# Patient Record
Sex: Male | Born: 1950 | Race: White | Hispanic: No | Marital: Single | Smoking: Current some day smoker
Health system: Southern US, Community
[De-identification: ages and names within clinical notes are randomized; demographics above are authoritative.]

## PROBLEM LIST (undated history)

## (undated) DIAGNOSIS — R569 Unspecified convulsions: Secondary | ICD-10-CM

## (undated) DIAGNOSIS — F1010 Alcohol abuse, uncomplicated: Secondary | ICD-10-CM

## (undated) DIAGNOSIS — I10 Essential (primary) hypertension: Secondary | ICD-10-CM

## (undated) DIAGNOSIS — N182 Chronic kidney disease, stage 2 (mild): Secondary | ICD-10-CM

## (undated) DIAGNOSIS — I639 Cerebral infarction, unspecified: Secondary | ICD-10-CM

## (undated) DIAGNOSIS — C787 Secondary malignant neoplasm of liver and intrahepatic bile duct: Secondary | ICD-10-CM

## (undated) HISTORY — DX: Cerebral infarction, unspecified: I63.9

## (undated) HISTORY — DX: Alcohol abuse, uncomplicated: F10.10

## (undated) HISTORY — DX: Essential (primary) hypertension: I10

## (undated) HISTORY — DX: Unspecified convulsions: R56.9

## (undated) HISTORY — PX: CERVICAL DISC SURGERY: SHX588

---

## 2000-05-31 ENCOUNTER — Inpatient Hospital Stay (HOSPITAL_COMMUNITY): Admission: EM | Admit: 2000-05-31 | Discharge: 2000-06-02 | Payer: Self-pay

## 2000-05-31 ENCOUNTER — Encounter: Payer: Self-pay | Admitting: Surgery

## 2000-06-01 ENCOUNTER — Encounter: Payer: Self-pay | Admitting: Surgery

## 2008-03-04 ENCOUNTER — Emergency Department (HOSPITAL_COMMUNITY): Admission: EM | Admit: 2008-03-04 | Discharge: 2008-03-04 | Payer: Self-pay | Admitting: Emergency Medicine

## 2009-12-05 ENCOUNTER — Emergency Department (HOSPITAL_COMMUNITY): Admission: EM | Admit: 2009-12-05 | Discharge: 2009-12-05 | Payer: Self-pay | Admitting: Family Medicine
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
## 2010-12-27 LAB — CBC
HCT: 44.7 % (ref 39.0–52.0)
Hemoglobin: 15.3 g/dL (ref 13.0–17.0)
MCHC: 34.2 g/dL (ref 30.0–36.0)
MCV: 93.8 fL (ref 78.0–100.0)
Platelets: 239 10*3/uL (ref 150–400)
RBC: 4.77 MIL/uL (ref 4.22–5.81)
RDW: 13.3 % (ref 11.5–15.5)
WBC: 15.2 10*3/uL — ABNORMAL HIGH (ref 4.0–10.5)

## 2010-12-27 LAB — HEPATIC FUNCTION PANEL
ALT: 17 U/L (ref 0–53)
AST: 16 U/L (ref 0–37)
Alkaline Phosphatase: 55 U/L (ref 39–117)
Bilirubin, Direct: 0.2 mg/dL (ref 0.0–0.3)
Total Protein: 7 g/dL (ref 6.0–8.3)

## 2010-12-27 LAB — DIFFERENTIAL
Basophils Absolute: 0.2 10*3/uL — ABNORMAL HIGH (ref 0.0–0.1)
Basophils Relative: 1 % (ref 0–1)
Eosinophils Absolute: 0.1 10*3/uL (ref 0.0–0.7)
Eosinophils Relative: 1 % (ref 0–5)
Monocytes Absolute: 1 10*3/uL (ref 0.1–1.0)
Monocytes Relative: 7 % (ref 3–12)
Neutro Abs: 11.9 10*3/uL — ABNORMAL HIGH (ref 1.7–7.7)

## 2010-12-27 LAB — BASIC METABOLIC PANEL
BUN: 15 mg/dL (ref 6–23)
CO2: 24 mEq/L (ref 19–32)
Calcium: 9.1 mg/dL (ref 8.4–10.5)
Chloride: 106 mEq/L (ref 96–112)
Creatinine, Ser: 0.9 mg/dL (ref 0.4–1.5)
GFR calc Af Amer: >60 mL/min (ref >60)
GFR calc non Af Amer: >60 mL/min (ref >60)
Glucose, Bld: 111 mg/dL — ABNORMAL HIGH (ref 70–99)
Potassium: 3.6 mEq/L (ref 3.5–5.1)
Sodium: 139 mEq/L (ref 135–145)

## 2010-12-27 LAB — URINALYSIS, ROUTINE W REFLEX MICROSCOPIC
Ketones, ur: NEGATIVE mg/dL
Specific Gravity, Urine: 1.028 (ref 1.005–1.030)
Urobilinogen, UA: 2 mg/dL — ABNORMAL HIGH (ref 0.0–1.0)

## 2010-12-27 LAB — URINE MICROSCOPIC-ADD ON

## 2016-06-18 ENCOUNTER — Inpatient Hospital Stay (HOSPITAL_COMMUNITY): Payer: Medicare HMO

## 2016-06-18 ENCOUNTER — Emergency Department (HOSPITAL_COMMUNITY): Payer: Medicare HMO

## 2016-06-18 ENCOUNTER — Encounter (HOSPITAL_COMMUNITY): Payer: Self-pay

## 2016-06-18 ENCOUNTER — Inpatient Hospital Stay (HOSPITAL_COMMUNITY)
Admission: EM | Admit: 2016-06-18 | Discharge: 2016-06-28 | DRG: 065 | Disposition: A | Discharge status: Home Health | Payer: Medicare HMO | Attending: Internal Medicine | Admitting: Internal Medicine

## 2016-06-18 DIAGNOSIS — Z881 Allergy status to other antibiotic agents status: Secondary | ICD-10-CM

## 2016-06-18 DIAGNOSIS — G4733 Obstructive sleep apnea (adult) (pediatric): Secondary | ICD-10-CM | POA: Diagnosis not present

## 2016-06-18 DIAGNOSIS — E669 Obesity, unspecified: Secondary | ICD-10-CM | POA: Diagnosis not present

## 2016-06-18 DIAGNOSIS — Z79899 Other long term (current) drug therapy: Secondary | ICD-10-CM

## 2016-06-18 DIAGNOSIS — I10 Essential (primary) hypertension: Secondary | ICD-10-CM | POA: Diagnosis present

## 2016-06-18 DIAGNOSIS — Z23 Encounter for immunization: Secondary | ICD-10-CM | POA: Diagnosis present

## 2016-06-18 DIAGNOSIS — W06XXXA Fall from bed, initial encounter: Secondary | ICD-10-CM | POA: Diagnosis not present

## 2016-06-18 DIAGNOSIS — Z9114 Patient's other noncompliance with medication regimen: Secondary | ICD-10-CM

## 2016-06-18 DIAGNOSIS — Z720 Tobacco use: Secondary | ICD-10-CM | POA: Diagnosis present

## 2016-06-18 DIAGNOSIS — Z781 Physical restraint status: Secondary | ICD-10-CM | POA: Diagnosis not present

## 2016-06-18 DIAGNOSIS — I611 Nontraumatic intracerebral hemorrhage in hemisphere, cortical: Principal | ICD-10-CM

## 2016-06-18 DIAGNOSIS — F10931 Alcohol use, unspecified with withdrawal delirium: Secondary | ICD-10-CM | POA: Diagnosis present

## 2016-06-18 DIAGNOSIS — F17200 Nicotine dependence, unspecified, uncomplicated: Secondary | ICD-10-CM | POA: Diagnosis not present

## 2016-06-18 DIAGNOSIS — Z6828 Body mass index (BMI) 28.0-28.9, adult: Secondary | ICD-10-CM

## 2016-06-18 DIAGNOSIS — E870 Hyperosmolality and hypernatremia: Secondary | ICD-10-CM | POA: Diagnosis present

## 2016-06-18 DIAGNOSIS — I619 Nontraumatic intracerebral hemorrhage, unspecified: Secondary | ICD-10-CM | POA: Diagnosis not present

## 2016-06-18 DIAGNOSIS — S022XXA Fracture of nasal bones, initial encounter for closed fracture: Secondary | ICD-10-CM | POA: Diagnosis present

## 2016-06-18 DIAGNOSIS — R569 Unspecified convulsions: Secondary | ICD-10-CM | POA: Diagnosis not present

## 2016-06-18 DIAGNOSIS — S06360A Traumatic hemorrhage of cerebrum, unspecified, without loss of consciousness, initial encounter: Secondary | ICD-10-CM | POA: Diagnosis not present

## 2016-06-18 DIAGNOSIS — F10231 Alcohol dependence with withdrawal delirium: Secondary | ICD-10-CM | POA: Diagnosis present

## 2016-06-18 DIAGNOSIS — N183 Chronic kidney disease, stage 3 (moderate): Secondary | ICD-10-CM | POA: Diagnosis present

## 2016-06-18 DIAGNOSIS — E871 Hypo-osmolality and hyponatremia: Secondary | ICD-10-CM | POA: Diagnosis present

## 2016-06-18 DIAGNOSIS — E1122 Type 2 diabetes mellitus with diabetic chronic kidney disease: Secondary | ICD-10-CM | POA: Diagnosis present

## 2016-06-18 DIAGNOSIS — Z8679 Personal history of other diseases of the circulatory system: Secondary | ICD-10-CM | POA: Diagnosis present

## 2016-06-18 DIAGNOSIS — Z716 Tobacco abuse counseling: Secondary | ICD-10-CM

## 2016-06-18 DIAGNOSIS — I612 Nontraumatic intracerebral hemorrhage in hemisphere, unspecified: Secondary | ICD-10-CM | POA: Diagnosis not present

## 2016-06-18 DIAGNOSIS — G9389 Other specified disorders of brain: Secondary | ICD-10-CM | POA: Diagnosis not present

## 2016-06-18 DIAGNOSIS — R001 Bradycardia, unspecified: Secondary | ICD-10-CM | POA: Diagnosis not present

## 2016-06-18 DIAGNOSIS — I610 Nontraumatic intracerebral hemorrhage in hemisphere, subcortical: Secondary | ICD-10-CM | POA: Diagnosis not present

## 2016-06-18 DIAGNOSIS — R4182 Altered mental status, unspecified: Secondary | ICD-10-CM | POA: Diagnosis not present

## 2016-06-18 DIAGNOSIS — S069X0S Unspecified intracranial injury without loss of consciousness, sequela: Secondary | ICD-10-CM | POA: Diagnosis not present

## 2016-06-18 DIAGNOSIS — R29818 Other symptoms and signs involving the nervous system: Secondary | ICD-10-CM

## 2016-06-18 DIAGNOSIS — N179 Acute kidney failure, unspecified: Secondary | ICD-10-CM | POA: Diagnosis present

## 2016-06-18 DIAGNOSIS — E785 Hyperlipidemia, unspecified: Secondary | ICD-10-CM | POA: Diagnosis not present

## 2016-06-18 DIAGNOSIS — G40409 Other generalized epilepsy and epileptic syndromes, not intractable, without status epilepticus: Secondary | ICD-10-CM | POA: Diagnosis present

## 2016-06-18 DIAGNOSIS — R69 Illness, unspecified: Secondary | ICD-10-CM | POA: Diagnosis not present

## 2016-06-18 DIAGNOSIS — I129 Hypertensive chronic kidney disease with stage 1 through stage 4 chronic kidney disease, or unspecified chronic kidney disease: Secondary | ICD-10-CM | POA: Diagnosis present

## 2016-06-18 DIAGNOSIS — T50995A Adverse effect of other drugs, medicaments and biological substances, initial encounter: Secondary | ICD-10-CM | POA: Diagnosis not present

## 2016-06-18 DIAGNOSIS — G40909 Epilepsy, unspecified, not intractable, without status epilepticus: Secondary | ICD-10-CM

## 2016-06-18 DIAGNOSIS — T1490XA Injury, unspecified, initial encounter: Secondary | ICD-10-CM | POA: Diagnosis not present

## 2016-06-18 DIAGNOSIS — K439 Ventral hernia without obstruction or gangrene: Secondary | ICD-10-CM | POA: Diagnosis present

## 2016-06-18 DIAGNOSIS — F17210 Nicotine dependence, cigarettes, uncomplicated: Secondary | ICD-10-CM | POA: Diagnosis present

## 2016-06-18 DIAGNOSIS — I629 Nontraumatic intracranial hemorrhage, unspecified: Secondary | ICD-10-CM | POA: Diagnosis not present

## 2016-06-18 DIAGNOSIS — I6789 Other cerebrovascular disease: Secondary | ICD-10-CM | POA: Diagnosis not present

## 2016-06-18 LAB — URINALYSIS, ROUTINE W REFLEX MICROSCOPIC
BILIRUBIN URINE: NEGATIVE
GLUCOSE, UA: 50 mg/dL — AB
KETONES UR: NEGATIVE mg/dL
LEUKOCYTES UA: NEGATIVE
NITRITE: NEGATIVE
PROTEIN: 100 mg/dL — AB
Specific Gravity, Urine: 1.011 (ref 1.005–1.030)
Squamous Epithelial / LPF: NONE SEEN
pH: 6 (ref 5.0–8.0)

## 2016-06-18 LAB — COMPREHENSIVE METABOLIC PANEL
ALBUMIN: 4.2 g/dL (ref 3.5–5.0)
ALT: 18 U/L (ref 17–63)
AST: 27 U/L (ref 15–41)
Alkaline Phosphatase: 69 U/L (ref 38–126)
Anion gap: 15 (ref 5–15)
BUN: 15 mg/dL (ref 6–20)
CHLORIDE: 106 mmol/L (ref 101–111)
CO2: 19 mmol/L — AB (ref 22–32)
Calcium: 8.8 mg/dL — ABNORMAL LOW (ref 8.9–10.3)
Creatinine, Ser: 1.56 mg/dL — ABNORMAL HIGH (ref 0.61–1.24)
GFR calc non Af Amer: 45 mL/min — ABNORMAL LOW (ref >60)
GFR, EST AFRICAN AMERICAN: 52 mL/min — AB (ref >60)
Glucose, Bld: 188 mg/dL — ABNORMAL HIGH (ref 65–99)
Potassium: 3.7 mmol/L (ref 3.5–5.1)
SODIUM: 140 mmol/L (ref 135–145)
Total Bilirubin: 1.3 mg/dL — ABNORMAL HIGH (ref 0.3–1.2)
Total Protein: 7.4 g/dL (ref 6.5–8.1)

## 2016-06-18 LAB — MRSA PCR SCREENING: MRSA BY PCR: NEGATIVE

## 2016-06-18 LAB — RAPID URINE DRUG SCREEN, HOSP PERFORMED
AMPHETAMINES: NOT DETECTED
Barbiturates: NOT DETECTED
Benzodiazepines: POSITIVE — AB
Cocaine: NOT DETECTED
OPIATES: NOT DETECTED
Tetrahydrocannabinol: NOT DETECTED

## 2016-06-18 LAB — ETHANOL: Alcohol, Ethyl (B): <5 mg/dL (ref <5)

## 2016-06-18 LAB — CBC
HCT: 46.3 % (ref 39.0–52.0)
Hemoglobin: 15.7 g/dL (ref 13.0–17.0)
MCH: 31.9 pg (ref 26.0–34.0)
MCHC: 33.9 g/dL (ref 30.0–36.0)
MCV: 94.1 fL (ref 78.0–100.0)
PLATELETS: 211 10*3/uL (ref 150–400)
RBC: 4.92 MIL/uL (ref 4.22–5.81)
RDW: 13 % (ref 11.5–15.5)
WBC: 11.5 10*3/uL — ABNORMAL HIGH (ref 4.0–10.5)

## 2016-06-18 LAB — TYPE AND SCREEN
ABO/RH(D): A POS
ANTIBODY SCREEN: NEGATIVE

## 2016-06-18 LAB — CBG MONITORING, ED: Glucose-Capillary: 173 mg/dL — ABNORMAL HIGH (ref 65–99)

## 2016-06-18 LAB — PROTIME-INR
INR: 1.07
Prothrombin Time: 13.9 seconds (ref 11.4–15.2)

## 2016-06-18 LAB — ABO/RH: ABO/RH(D): A POS

## 2016-06-18 MED ORDER — CLEVIDIPINE BUTYRATE 0.5 MG/ML IV EMUL
INTRAVENOUS | Status: AC
  Filled 2016-06-18: qty 50

## 2016-06-18 MED ORDER — ACETAMINOPHEN 650 MG RE SUPP: 650.0000 mg | RECTAL | Status: DC | PRN

## 2016-06-18 MED ORDER — ACETAMINOPHEN 325 MG PO TABS
650.0000 mg | ORAL_TABLET | ORAL | Status: DC | PRN
  Administered 2016-06-18 – 2016-06-20 (×3): 650 mg via ORAL
  Filled 2016-06-18 (×3): qty 2

## 2016-06-18 MED ORDER — LORAZEPAM 2 MG/ML IJ SOLN
1.0000 mg | INTRAMUSCULAR | Status: AC | PRN
  Administered 2016-06-18 – 2016-06-19 (×2): 1 mg via INTRAVENOUS
  Filled 2016-06-18: qty 1

## 2016-06-18 MED ORDER — LORAZEPAM 2 MG/ML IJ SOLN
INTRAMUSCULAR | Status: AC
  Administered 2016-06-18: 2 mg
  Filled 2016-06-18: qty 1

## 2016-06-18 MED ORDER — LORAZEPAM 2 MG/ML IJ SOLN
INTRAMUSCULAR | Status: AC
Start: 2016-06-18 — End: 2016-06-18
  Administered 2016-06-18: 1 mg
  Filled 2016-06-18: qty 1

## 2016-06-18 MED ORDER — STROKE: EARLY STAGES OF RECOVERY BOOK
Freq: Once | Status: AC
  Administered 2016-06-18: 1
  Filled 2016-06-18: qty 1

## 2016-06-18 MED ORDER — SODIUM CHLORIDE 0.9 % IV SOLN
1000.0000 mg | Freq: Once | INTRAVENOUS | Status: AC
  Administered 2016-06-18: 1000 mg via INTRAVENOUS
  Filled 2016-06-18: qty 10

## 2016-06-18 MED ORDER — SENNOSIDES-DOCUSATE SODIUM 8.6-50 MG PO TABS
1.0000 | ORAL_TABLET | Freq: Two times a day (BID) | ORAL | Status: DC
Start: 2016-06-18 — End: 2016-06-23
  Administered 2016-06-19 – 2016-06-21 (×4): 1 via ORAL
  Filled 2016-06-18 (×7): qty 1

## 2016-06-18 MED ORDER — SODIUM CHLORIDE 0.9 % IV SOLN
500.0000 mg | Freq: Two times a day (BID) | INTRAVENOUS | Status: DC
  Administered 2016-06-18 – 2016-06-25 (×14): 500 mg via INTRAVENOUS
  Filled 2016-06-18 (×14): qty 5

## 2016-06-18 MED ORDER — ACETAMINOPHEN 160 MG/5ML PO SOLN: 650.0000 mg | ORAL | Status: DC | PRN

## 2016-06-18 MED ORDER — TETANUS-DIPHTH-ACELL PERTUSSIS 5-2.5-18.5 LF-MCG/0.5 IM SUSP
0.5000 mL | Freq: Once | INTRAMUSCULAR | Status: AC
  Administered 2016-06-18: 0.5 mL via INTRAMUSCULAR
  Filled 2016-06-18: qty 0.5

## 2016-06-18 MED ORDER — IOPAMIDOL (ISOVUE-370) INJECTION 76%
INTRAVENOUS | Status: AC
  Administered 2016-06-18: 18:00:00
  Filled 2016-06-18: qty 50

## 2016-06-18 MED ORDER — CLEVIDIPINE BUTYRATE 0.5 MG/ML IV EMUL
0.0000 mg/h | INTRAVENOUS | Status: DC
  Administered 2016-06-18: 10 mg/h via INTRAVENOUS
  Administered 2016-06-18: 1 mg/h via INTRAVENOUS
  Filled 2016-06-18 (×3): qty 50

## 2016-06-18 MED ORDER — PANTOPRAZOLE SODIUM 40 MG IV SOLR
40.0000 mg | Freq: Every day | INTRAVENOUS | Status: DC
  Administered 2016-06-18 – 2016-06-19 (×2): 40 mg via INTRAVENOUS
  Filled 2016-06-18 (×2): qty 40

## 2016-06-18 NOTE — H&P (Addendum)
Neurology H&P Reason for Consult: ICH  CC: seizure  History is obtained from:patient  HPI: Gabriel Romero is a 66 y.o. male with a history of severe hypertension who presents with new onset seizures in the setting of a typical hematoma. He did fall earlier in the day, but does not clear if he hit his head, there is no report of loss of consciousness. He then had sudden onset seizure activity was brought in via EMS. Head CT reveals a temporal lobe hemorrhage.   LKW: Earlier today, I'm not certain what time tpa given?: no, ICH ICH Score: 1   ROS: Unable to obtain due to altered mental status.   Past medical history: Hypertension  FHx: Father - MI @ age 5   Social History:  reports that he has never smoked. He does not have any smokeless tobacco history on file. He reports that he drinks alcohol. His drug history is not on file.   He drinks at least 8 beers per day.    Exam: Current vital signs: BP 133/75   Pulse 78   Resp 19   Ht 6\' 1"  (1.854 m)   Wt 124.7 kg (275 lb)   SpO2 97%   BMI 36.28 kg/m  Vital signs in last 24 hours: Pulse Rate:  [78-124] 78 (03/28 1640) Resp:  [17-30] 19 (03/28 1640) BP: (133-230)/(75-176) 133/75 (03/28 1640) SpO2:  [91 %-100 %] 97 % (03/28 1640) Weight:  [124.7 kg (275 lb)] 124.7 kg (275 lb) (03/28 1527)   Physical Exam  Constitutional: Appears well-developed and well-nourished.  Psych: Affect appropriate to situation Eyes: No scleral injection HENT: No OP obstrucion, no clear evidence of head injury.  Head: Normocephalic.  Cardiovascular: Normal rate and regular rhythm.  Respiratory: Effort normal and breath sounds normal to anterior ascultation GI: Soft.  No distension. There is no tenderness.  Skin: Some abraisions  Neuro: Mental Status: Patient arouses to minor stimuli, he follows some simple commands. He states "I am cold" Cranial Nerves: II: Visual Fields are full. Pupils are equal, round, and reactive to light.    III,IV, VI: He is able to cross midline both directions, eyes are slightly disconjugate when looking towards the right but conjugate on leftward gaze V: Facial sensation is symmetric to temperature VII: Facial movement appears grossly VIII: hearing is intact to voice X: Uvula elevates symmetrically XII: tongue with slight left deviation Motor: He moves his right side more than the left, but does move both to command. He wiggles toes and shows thumbs Sensory: He does respond to noxious stimulation bilaterally Cerebellar: Does not perform.    I have reviewed labs in epic and the results pertinent to this consultation are: Cr 1.5  I have reviewed the images obtained: CT head - right temporal   Impression: 66 year old male with intraparenchymal hematoma. It is unclear whether the hematoma contributed to the fall or the fall to the hematoma. Certainly, a fall with head injury significant enough to give him a intraparenchymal hematoma I would expect to have loss of consciousness, not to have him get up and continue about his work for the rest of the day. Hemorrhagic infarct would be another possibility.  Given the severity of his hypertension (>703 systolic) .  I would favor using a slightly more liberal goal of 160 for blood pressure control.  Recommendations: 1) Admit to ICU 2) no antiplatelets or anticoagulants 3) blood pressure control with goal systolic 500 - 938 4) Frequent neuro checks 5) If symptoms worsen  or there is decreased mental status, repeat stat head CT 6) PT,OT,ST 7) MRI brain 8) I do not think he is in alcohol withdrawal the current time and he received significant doses of benzodiazepine, I will hold off on CIWA for tonight but he will likely need this starting soon 9) cleviprex for BP control.   This patient is critically ill and at significant risk of neurological worsening, death and care requires constant monitoring of vital signs, hemodynamics,respiratory and  cardiac monitoring, neurological assessment, discussion with family, other specialists and medical decision making of high complexity. I spent 50 minutes of neurocritical care time  in the care of  this patient.  Roland Rack, MD [facility] [phone]  If 7pm- 7am, please page neurology on call as listed in [place]. 06/18/2016  6:51 PM

## 2016-06-18 NOTE — Progress Notes (Signed)
[facility] Progress Note Patient Name: KEYLOR RANDS DOB: 1951/02/27 MRN: [id number]   Date of Service  06/18/2016  HPI/Events of Note  presents with new onset seizures in the setting of a typical hematoma. He did fall earlier in the day, but does not clear if he hit his head, there is no report of loss of consciousness. He then had sudden onset seizure activity was brought in via EMS. Head CT reveals a temporal lobe hemorrhage.  eICU Interventions  1) Admit to ICU, Neuro hosp Attending Phy 2) no antiplatelets or anticoagulants 3) blood pressure control with goal systolic 224 - 825 4) Frequent neuro checks 5) If symptoms worsen or there is decreased mental status, repeat stat head CT      Intervention Category Evaluation Type: New Patient Evaluation  Saisha Hogue 06/18/2016, 7:00 PM

## 2016-06-18 NOTE — ED Provider Notes (Signed)
[facility] DEPT Provider Note   CSN: [id number] Arrival date & time: 06/18/16  1520     History   Chief Complaint Chief Complaint  Patient presents with  . Seizures    HPI Gabriel Romero is a 66 y.o. male.  HPI  66 year old male with history of hypertension with noncompliance with his medications, aneurysms that his family states "are either in his chest or in his head," who presents with altered mental status s/p episode of seizure activity. EMS was called out to the pt's workplace for seizure activity. When they arrived, the patient was reportedly having generalized tonic-clonic seizure activity. They gave 5 mg of IV Versed with resolution of seizure activity, at which point the patient became very agitated, fighting and struggling with paramedics. He was given an additional 5 mg of Versed, at which point he continued to be agitated but was able to be safely transported to the emergency department. Coworkers state that the patient did have a fall earlier in the day, but they were unsure whether he hit head or lost consciousness.  When the patient's family arrives at bedside, they deny history of blood thinners or antiplatelet agents. They state that the patient has never had seizures before that they are aware of.  Patient is agitated, but redirectable on arrival after receiving 4 mg of IV Ativan. He is able to tell us that he has a headache, but is unable to provide additional history.  History reviewed. No pertinent past medical history.  Patient Active Problem List   Diagnosis Date Noted  . ICH (intracerebral hemorrhage) ([place]) 06/18/2016    History reviewed. No pertinent surgical history.     Home Medications    Prior to Admission medications   Not on File    Family History History reviewed. No pertinent family history.  Social History Social History  Substance Use Topics  . Smoking status: Never Smoker  . Smokeless tobacco: Not on file  . Alcohol use Yes       Allergies   Sulfa antibiotics   Review of Systems Review of Systems  Unable to perform ROS: Mental status change     Physical Exam Updated Vital Signs BP (!) 142/56   Pulse 73   Temp 97.2 F (36.2 C) (Oral)   Resp 15   Ht 5\' 10"  (1.778 m)   Wt 90 kg   SpO2 94%   BMI 28.47 kg/m   Physical Exam  Constitutional: He appears well-developed and well-nourished. He appears distressed (agitated).  HENT:  Head: Normocephalic and atraumatic.  Right Ear: External ear normal.  Left Ear: External ear normal.  Mouth/Throat: Oropharynx is clear and moist.  Eyes: Conjunctivae and EOM are normal. Pupils are equal, round, and reactive to light.  Neck: Normal range of motion. Neck supple. No tracheal deviation present.  Cardiovascular: Normal heart sounds and intact distal pulses.   No murmur heard. Tachycardic to 100 bpm  Pulmonary/Chest: Effort normal and breath sounds normal. No respiratory distress. He has no wheezes. He has no rales.  Abdominal: Soft. Bowel sounds are normal. He exhibits no distension. There is no tenderness. A hernia (ventral hernia, self-reduces) is present.  Musculoskeletal:  Skin tears to the LUE, otherwise extremities are atrauamtic  Neurological: He exhibits normal muscle tone.  Greater strength to the R-hemibody compared to the left. No facial droop. No slurred speech. Acutely agitated.   Skin: He is not diaphoretic.  Skin tear to the L forearm     ED Treatments /  Results  Labs (all labs ordered are listed, but only abnormal results are displayed) Labs Reviewed  COMPREHENSIVE METABOLIC PANEL - Abnormal; Notable for the following:       Result Value   CO2 19 (*)    Glucose, Bld 188 (*)    Creatinine, Ser 1.56 (*)    Calcium 8.8 (*)    Total Bilirubin 1.3 (*)    GFR calc non Af Amer 45 (*)    GFR calc Af Amer 52 (*)    All other components within normal limits  CBC - Abnormal; Notable for the following:    WBC 11.5 (*)    All other  components within normal limits  RAPID URINE DRUG SCREEN, HOSP PERFORMED - Abnormal; Notable for the following:    Benzodiazepines POSITIVE (*)    All other components within normal limits  URINALYSIS, ROUTINE W REFLEX MICROSCOPIC - Abnormal; Notable for the following:    Glucose, UA 50 (*)    Hgb urine dipstick SMALL (*)    Protein, ur 100 (*)    Bacteria, UA RARE (*)    All other components within normal limits  CBG MONITORING, ED - Abnormal; Notable for the following:    Glucose-Capillary 173 (*)    All other components within normal limits  MRSA PCR SCREENING  ETHANOL  PROTIME-INR  TYPE AND SCREEN  ABO/RH    EKG  EKG Interpretation None       Radiology Ct Angio Head W Or Wo Contrast  Result Date: 06/18/2016 CLINICAL DATA:  Initial evaluation for acute intracranial hemorrhage. EXAM: CT ANGIOGRAPHY HEAD TECHNIQUE: Multidetector CT imaging of the head was performed using the standard protocol during bolus administration of intravenous contrast. Multiplanar CT image reconstructions and MIPs were obtained to evaluate the vascular anatomy. CONTRAST:  50 cc of Isovue 370. COMPARISON:  Prior CT from earlier the same day. FINDINGS: CTA HEAD Anterior circulation: Distal cervical segments of the internal carotid arteries are well opacified and widely patent. Petrous segments widely patent bilaterally. Scattered calcified atheromatous plaque throughout the cavernous/ supraclinoid ICAs. Resultant moderate to advanced multifocal narrowing (approximately 50-75%), slightly worse to the left. Note made of a focal moderate stenosis of approximately 50% within the proximal cavernous left ICA (series 502, image 46). The ICA termini widely patent bilaterally. A1 segments widely patent. Anterior communicating artery normal. Anterior cerebral arteries patent to their distal aspects. M1 segments patent without stenosis or occlusion. No proximal M2 occlusion. Distal MCA branches well opacified and  symmetric. No vascular abnormality seen underlying the peripheral right temporal lobe hemorrhage. Posterior circulation: Visualized vertebral arteries widely patent to the vertebrobasilar junction. Left PICA patent proximally. Right PICA not well visualized. Basilar artery widely patent to its distal aspect. Superior cerebral arteries patent bilaterally. Both of the posterior cerebral arteries primarily supplied via the basilar and are widely patent to their distal aspects. Venous sinuses: Not well evaluated on this exam due to timing of the contrast bolus. No obvious venous sinus thrombosis. Anatomic variants: No significant anatomic variant. No aneurysm or vascular malformation. No abnormality seen underlying the right temporal hemorrhage. Delayed phase: No pathologic enhancement. Previously identified hemorrhage within the right temporal lobe is relatively stable measuring 2.6 x 1.7 x 1.8 cm. IMPRESSION: 1. Negative CTA of the brain. No acute vascular abnormality identified. No AVM or other vascular abnormality seen underlying the right temporal lobe hemorrhage. 2. Atheromatous plaque involving the carotid siphons with moderate to advanced multifocal narrowing (approximately 50-75%), left greater than right. 3. No  significant interval change in size and appearance of right temporal lobe hemorrhage, measuring 2.6 x 1.7 x 1.8 cm. No significant mass effect. Electronically Signed   By: Jeannine Boga M.D.   On: 06/18/2016 19:14   Ct Head Wo Contrast  Result Date: 06/19/2016 CLINICAL DATA:  Follow-up of intracranial hemorrhage. EXAM: CT HEAD WITHOUT CONTRAST TECHNIQUE: Contiguous axial images were obtained from the base of the skull through the vertex without intravenous contrast. COMPARISON:  CT angiogram and CT of the head dated 06/18/2016. FINDINGS: Brain: Stable hematoma within the right temporal lobe. No new acute large territory infarct, intracranial hemorrhage, or focal mass effect identified.  Advanced chronic microvascular ischemic changes of white matter, chronic right parietal infarct, multiple small chronic lacunar infarcts within the basal ganglia, and mild brain parenchymal volume loss are stable. Stable ventricle size. Vascular: Extensive calcific atherosclerosis of cavernous and paraclinoid internal carotid arteries. Skull: Mildly displaced acute fracture deformity of the nasal bones with mild displacement. No displaced calvarial fracture. Sinuses/Orbits: Mild diffuse ethmoid sinus mucosal thickening and mucous retention cysts within the left maxillary sinus. Chronic left lamina papyracea fracture with herniation of extra orbital fat and mild medial displacement of the medial rectus muscle. Other: Periapical lucencies of multiple teeth in the maxillary alveolar bone compatible with odontogenic disease. IMPRESSION: 1. Stable acute hemorrhage within the right lateral temporal lobe. This area is atypical for hypertensive hemorrhage and traumatic hemorrhagic contusion or underlying hemorrhagic mass should be considered. 2. Stable advanced chronic microvascular ischemic changes of the brain, chronic infarcts, and volume loss. 3. Acute appearing mildly displaced nasal bone fractures and chronic left lamina papyracea fracture are stable. Electronically Signed   By: Kristine Garbe M.D.   On: 06/19/2016 00:35   Ct Head Wo Contrast  Result Date: 06/18/2016 CLINICAL DATA:  Altered mental status.  Seizure activity. EXAM: CT HEAD WITHOUT CONTRAST TECHNIQUE: Contiguous axial images were obtained from the base of the skull through the vertex without intravenous contrast. COMPARISON:  None. FINDINGS: Brain: Acute hemorrhage in the right lateral temporal lobe measures 27 x 19 x 13 mm. Hemorrhage volume 3.3 mL. No significant surrounding edema or mass. Mild atrophy without hydrocephalus. Hypodensity throughout the cerebral white matter bilaterally most likely chronic microvascular ischemia.  Hypodensity in the right parietal lobe involving white matter and cortex. This is most compatible with infarction, probably old but difficult to state with certainty. No other areas of hemorrhage. No shift of the midline structures. No subarachnoid hemorrhage or subdural hemorrhage. Vascular: Arterial calcification in the cavernous carotid bilaterally. Negative for hyperdense vessel. Skull: Negative for skull fracture. No significant scalp soft tissue swelling. Chronic fracture left medial orbit. Sinuses/Orbits: Mucosal edema in the paranasal sinuses. Other: None IMPRESSION: Small area of acute hemorrhage in the right lateral temporal lobe. Hemorrhage volume 3.3 mL. No significant surrounding edema or mass lesion. Differential considerations would include a traumatic hemorrhagic contusion. Venous infarction is possible. Hemorrhagic mass or infarct are consider less likely. Further evaluation with MRI and CTA suggested. Generalized atrophy with diffuse white matter disease most likely chronic. Hypodensity in the right parietal lobe most likely a chronic infarct. These results were called by telephone at the time of interpretation on 06/18/2016 at 3:59 pm to Dr. Zenovia Jarred , who verbally acknowledged these results. Electronically Signed   By: Franchot Gallo M.D.   On: 06/18/2016 16:00   Ct Cervical Spine Wo Contrast  Result Date: 06/18/2016 CLINICAL DATA:  Combative with seizure-like activity at work today. EXAM: CT CERVICAL SPINE  WITHOUT CONTRAST TECHNIQUE: Multidetector CT imaging of the cervical spine was performed without intravenous contrast. Multiplanar CT image reconstructions were also generated. COMPARISON:  None. FINDINGS: Despite efforts by the technologist and patient, mild motion artifact is present on today's exam and could not be eliminated. This reduces exam sensitivity and specificity. Alignment: Straightening without focal angulation or listhesis. Vertebrae: No evidence of acute fracture or  traumatic subluxation. No focal osseous lesions are seen. Paraspinal tissues: No paraspinal inflammatory changes or fluid collections are demonstrated. There is carotid atherosclerosis bilaterally. Disc levels: There is multilevel spondylosis with disc space narrowing, uncinate spurring and facet hypertrophy. These findings contribute to mild to moderate spinal stenosis at C5-6 and C6-7. In addition, there is moderate osseous foraminal narrowing bilaterally from C3-4 through C5-6. No acute soft disc herniation noted. IMPRESSION: No evidence of acute cervical spine fracture, traumatic subluxation or static signs of instability. Multilevel spondylosis, contributing to spinal and foraminal narrowing as described. Electronically Signed   By: Richardean Sale M.D.   On: 06/18/2016 16:02    Procedures Procedures (including critical care time)  Medications Ordered in ED Medications  clevidipine (CLEVIPREX) 0.5 MG/ML infusion (not administered)  clevidipine (CLEVIPREX) infusion 0.5 mg/mL (4 mg/hr Intravenous Rate/Dose Change 06/18/16 2217)  acetaminophen (TYLENOL) tablet 650 mg (650 mg Oral Given 06/18/16 2038)    Or  acetaminophen (TYLENOL) solution 650 mg ( Per Tube See Alternative 06/18/16 2038)    Or  acetaminophen (TYLENOL) suppository 650 mg ( Rectal See Alternative 06/18/16 2038)  senna-docusate (Senokot-S) tablet 1 tablet (1 tablet Oral Not Given 06/18/16 2222)  pantoprazole (PROTONIX) injection 40 mg (40 mg Intravenous Given 06/18/16 2222)  levETIRAcetam (KEPPRA) 500 mg in sodium chloride 0.9 % 100 mL IVPB (500 mg Intravenous Given 06/18/16 2222)  LORazepam (ATIVAN) injection 1 mg (1 mg Intravenous Given 06/18/16 2335)  LORazepam (ATIVAN) 2 MG/ML injection (2 mg  Given 06/18/16 1525)  LORazepam (ATIVAN) 2 MG/ML injection (2 mg  Given 06/18/16 1530)  Tdap (BOOSTRIX) injection 0.5 mL (0.5 mLs Intramuscular Given 06/18/16 1832)  iopamidol (ISOVUE-370) 76 % injection (  Contrast Given 06/18/16 1757)    levETIRAcetam (KEPPRA) 1,000 mg in sodium chloride 0.9 % 100 mL IVPB (0 mg Intravenous Stopped 06/18/16 1823)   stroke: mapping our early stages of recovery book (1 each Does not apply Given 06/18/16 1900)  LORazepam (ATIVAN) 2 MG/ML injection (1 mg  Given 06/18/16 2330)     Initial Impression / Assessment and Plan / ED Course  I have reviewed the triage vital signs and the nursing notes.  Pertinent labs & imaging results that were available during my care of the patient were reviewed by me and considered in my medical decision making (see chart for details).    Patient is agitated on arrival. Afebrile and hemodynamically stable. He was given 4 mg of IV Ativan without improvement in his agitation to allow for further examination. He was then given 5 mg of IV Haldol, at which point he was calm. He answers simple yes or no questions and follows simple commands, but continues to be somewhat confused.  CT head obtained, which shows a right temporal lobe hemorrhage. Patient started on clevidipine drip with goal SBP of less than 160 at the request of neurosurgery. CT cervical spine without acute fractures or malalignment.  Neurology consultation and evaluated the patient in the emergency department. They will be admitting the patient for further management.   Patient overseen by my attending, Dr. Thomasene Lot.    Final Clinical  Impressions(s) / ED Diagnoses   Final diagnoses:  ICH (intracerebral hemorrhage) ([place])  Neurologic abnormality  Nontraumatic cortical hemorrhage of right cerebral hemisphere [facility])    New Prescriptions There are no discharge medications for this patient.      Jenifer Algernon Huxley, MD 06/19/16 0155    Courteney Julio Alm, MD 06/22/16 [phone]

## 2016-06-18 NOTE — ED Notes (Signed)
Family at bedside. 

## 2016-06-18 NOTE — ED Notes (Addendum)
Haldol 5 mg iv given per vo dr Thomasene Lot at [phone]

## 2016-06-18 NOTE — Consult Note (Addendum)
Reason for Consult:ICH temporal lobe Referring Physician: ED  Gabriel Romero is an 66 y.o. male.  HPI: whom while at work slipped on cracked eggs approximately 0930 today. He did not lose consciousness, and continued to work until this afternoon when he started to seize and fell to the floor. Brought to the [facility] ED he was noted to be combative and he was given multiple sedatives and haldol in order to perform the CT. CT showed a ich in the right temporal lobe, little mass effect. 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 No pertinent past medical history.  History reviewed. No pertinent surgical history.  History reviewed. No pertinent family history.  Social History:  reports that he has never smoked. He does not have any smokeless tobacco history on file. He reports that he drinks alcohol. His drug history is not on file.  Allergies:  Allergies  Allergen Reactions  . Sulfa Antibiotics Rash    Medications: I have reviewed the patient's current medications.  Results for orders placed or performed during the hospital encounter of 06/18/16 (from the past 48 hour(s))  CBG monitoring, ED     Status: Abnormal   Collection Time: 06/18/16  3:22 PM  Result Value Ref Range   Glucose-Capillary 173 (H) 65 - 99 mg/dL  Comprehensive metabolic panel     Status: Abnormal   Collection Time: 06/18/16  3:30 PM  Result Value Ref Range   Sodium 140 135 - 145 mmol/L   Potassium 3.7 3.5 - 5.1 mmol/L   Chloride 106 101 - 111 mmol/L   CO2 19 (L) 22 - 32 mmol/L   Glucose, Bld 188 (H) 65 - 99 mg/dL   BUN 15 6 - 20 mg/dL   Creatinine, Ser 1.56 (H) 0.61 - 1.24 mg/dL   Calcium 8.8 (L) 8.9 - 10.3 mg/dL   Total Protein 7.4 6.5 - 8.1 g/dL   Albumin 4.2 3.5 - 5.0 g/dL   AST 27 15 - 41 U/L   ALT 18 17 - 63 U/L   Alkaline Phosphatase 69 38 - 126 U/L   Total Bilirubin 1.3 (H) 0.3 - 1.2 mg/dL   GFR calc non Af Amer 45 (L) >60 mL/min   GFR calc Af Amer 52 (L)  >60 mL/min    Comment: (NOTE) The eGFR has been calculated using the CKD EPI equation. This calculation has not been validated in all clinical situations. eGFR's persistently <60 mL/min signify possible Chronic Kidney Disease.    Anion gap 15 5 - 15  CBC     Status: Abnormal   Collection Time: 06/18/16  3:30 PM  Result Value Ref Range   WBC 11.5 (H) 4.0 - 10.5 K/uL   RBC 4.92 4.22 - 5.81 MIL/uL   Hemoglobin 15.7 13.0 - 17.0 g/dL   HCT 46.3 39.0 - 52.0 %   MCV 94.1 78.0 - 100.0 fL   MCH 31.9 26.0 - 34.0 pg   MCHC 33.9 30.0 - 36.0 g/dL   RDW 13.0 11.5 - 15.5 %   Platelets 211 150 - 400 K/uL  Ethanol     Status: None   Collection Time: 06/18/16  3:30 PM  Result Value Ref Range   Alcohol, Ethyl (B) <5 <5 mg/dL    Comment:        LOWEST DETECTABLE LIMIT FOR SERUM ALCOHOL IS 5 mg/dL FOR MEDICAL PURPOSES ONLY   Type and screen [place]     Status: None  Collection Time: 06/18/16  3:30 PM  Result Value Ref Range   ABO/RH(D) A POS    Antibody Screen NEG    Sample Expiration 06/21/2016   Protime-INR     Status: None   Collection Time: 06/18/16  3:30 PM  Result Value Ref Range   Prothrombin Time 13.9 11.4 - 15.2 seconds   INR 1.07   ABO/Rh     Status: None   Collection Time: 06/18/16  3:30 PM  Result Value Ref Range   ABO/RH(D) A POS     Ct Head Wo Contrast  Result Date: 06/18/2016 CLINICAL DATA:  Altered mental status.  Seizure activity. EXAM: CT HEAD WITHOUT CONTRAST TECHNIQUE: Contiguous axial images were obtained from the base of the skull through the vertex without intravenous contrast. COMPARISON:  None. FINDINGS: Brain: Acute hemorrhage in the right lateral temporal lobe measures 27 x 19 x 13 mm. Hemorrhage volume 3.3 mL. No significant surrounding edema or mass. Mild atrophy without hydrocephalus. Hypodensity throughout the cerebral white matter bilaterally most likely chronic microvascular ischemia. Hypodensity in the right parietal lobe involving  white matter and cortex. This is most compatible with infarction, probably old but difficult to state with certainty. No other areas of hemorrhage. No shift of the midline structures. No subarachnoid hemorrhage or subdural hemorrhage. Vascular: Arterial calcification in the cavernous carotid bilaterally. Negative for hyperdense vessel. Skull: Negative for skull fracture. No significant scalp soft tissue swelling. Chronic fracture left medial orbit. Sinuses/Orbits: Mucosal edema in the paranasal sinuses. Other: None IMPRESSION: Small area of acute hemorrhage in the right lateral temporal lobe. Hemorrhage volume 3.3 mL. No significant surrounding edema or mass lesion. Differential considerations would include a traumatic hemorrhagic contusion. Venous infarction is possible. Hemorrhagic mass or infarct are consider less likely. Further evaluation with MRI and CTA suggested. Generalized atrophy with diffuse white matter disease most likely chronic. Hypodensity in the right parietal lobe most likely a chronic infarct. These results were called by telephone at the time of interpretation on 06/18/2016 at 3:59 pm to Dr. Zenovia Jarred , who verbally acknowledged these results. Electronically Signed   By: Franchot Gallo M.D.   On: 06/18/2016 16:00   Ct Cervical Spine Wo Contrast  Result Date: 06/18/2016 CLINICAL DATA:  Combative with seizure-like activity at work today. EXAM: CT CERVICAL SPINE WITHOUT CONTRAST TECHNIQUE: Multidetector CT imaging of the cervical spine was performed without intravenous contrast. Multiplanar CT image reconstructions were also generated. COMPARISON:  None. FINDINGS: Despite efforts by the technologist and patient, mild motion artifact is present on today's exam and could not be eliminated. This reduces exam sensitivity and specificity. Alignment: Straightening without focal angulation or listhesis. Vertebrae: No evidence of acute fracture or traumatic subluxation. No focal osseous lesions  are seen. Paraspinal tissues: No paraspinal inflammatory changes or fluid collections are demonstrated. There is carotid atherosclerosis bilaterally. Disc levels: There is multilevel spondylosis with disc space narrowing, uncinate spurring and facet hypertrophy. These findings contribute to mild to moderate spinal stenosis at C5-6 and C6-7. In addition, there is moderate osseous foraminal narrowing bilaterally from C3-4 through C5-6. No acute soft disc herniation noted. IMPRESSION: No evidence of acute cervical spine fracture, traumatic subluxation or static signs of instability. Multilevel spondylosis, contributing to spinal and foraminal narrowing as described. Electronically Signed   By: Richardean Sale M.D.   On: 06/18/2016 16:02    Review of Systems  Unable to perform ROS: Mental status change   Blood pressure 133/75, pulse 78, resp. rate 19, height '6\' 1"'$  (  1.854 m), weight 124.7 kg (275 lb), SpO2 97 %. Physical Exam  Constitutional: He appears well-developed and well-nourished. He appears lethargic.  HENT:  Head: Normocephalic.  Eyes: Pupils are equal, round, and reactive to light.  Neurological: He appears lethargic. He is disoriented.  Patient is sedated, breathing on his own with a rebreather.  Not following commands    Assessment/Plan: CTA, not at all usual place for traumatic hematoma. History also unusual for traumatic head injury given after slipping this AM ~9:30, he continued working for the day until he fell again when he seized.  I have ordered a CTA of the brain. No surgical intervention necessary.   Twylia Oka L 06/18/2016, 5:56 PM

## 2016-06-18 NOTE — ED Triage Notes (Signed)
Pt presents to the ed with ems after falling today and then having a witnessed seizure the patient is post ictal and combative on arrival with a manual blood pressure of 230/176, edp at bedside, he received 10 of versed iv en route

## 2016-06-18 NOTE — ED Notes (Signed)
Pt placed on partial nonrebreather due to sats being 88% on 4L , edp notified.

## 2016-06-19 ENCOUNTER — Other Ambulatory Visit (HOSPITAL_COMMUNITY): Payer: Medicare HMO

## 2016-06-19 ENCOUNTER — Inpatient Hospital Stay (HOSPITAL_COMMUNITY): Payer: Medicare HMO

## 2016-06-19 ENCOUNTER — Encounter (HOSPITAL_COMMUNITY): Payer: Self-pay

## 2016-06-19 DIAGNOSIS — S069X0S Unspecified intracranial injury without loss of consciousness, sequela: Secondary | ICD-10-CM

## 2016-06-19 DIAGNOSIS — I611 Nontraumatic intracerebral hemorrhage in hemisphere, cortical: Secondary | ICD-10-CM

## 2016-06-19 DIAGNOSIS — F17200 Nicotine dependence, unspecified, uncomplicated: Secondary | ICD-10-CM

## 2016-06-19 LAB — BASIC METABOLIC PANEL
Anion gap: 12 (ref 5–15)
BUN: 17 mg/dL (ref 6–20)
CALCIUM: 9.1 mg/dL (ref 8.9–10.3)
CO2: 22 mmol/L (ref 22–32)
Chloride: 106 mmol/L (ref 101–111)
Creatinine, Ser: 1.62 mg/dL — ABNORMAL HIGH (ref 0.61–1.24)
GFR calc Af Amer: 49 mL/min — ABNORMAL LOW (ref >60)
GFR, EST NON AFRICAN AMERICAN: 43 mL/min — AB (ref >60)
GLUCOSE: 120 mg/dL — AB (ref 65–99)
Potassium: 3.5 mmol/L (ref 3.5–5.1)
Sodium: 140 mmol/L (ref 135–145)

## 2016-06-19 LAB — CBC
HCT: 44.6 % (ref 39.0–52.0)
Hemoglobin: 14.9 g/dL (ref 13.0–17.0)
MCH: 31.5 pg (ref 26.0–34.0)
MCHC: 33.4 g/dL (ref 30.0–36.0)
MCV: 94.3 fL (ref 78.0–100.0)
PLATELETS: 204 10*3/uL (ref 150–400)
RBC: 4.73 MIL/uL (ref 4.22–5.81)
RDW: 13.2 % (ref 11.5–15.5)
WBC: 9.8 10*3/uL (ref 4.0–10.5)

## 2016-06-19 LAB — LIPID PANEL
CHOLESTEROL: 205 mg/dL — AB (ref 0–200)
HDL: 57 mg/dL (ref >40)
LDL Cholesterol: 123 mg/dL — ABNORMAL HIGH (ref 0–99)
Total CHOL/HDL Ratio: 3.6 RATIO
Triglycerides: 123 mg/dL (ref <150)
VLDL: 25 mg/dL (ref 0–40)

## 2016-06-19 LAB — TSH: TSH: 3.656 u[IU]/mL (ref 0.350–4.500)

## 2016-06-19 LAB — VITAMIN B12: VITAMIN B 12: 213 pg/mL (ref 180–914)

## 2016-06-19 MED ORDER — HALOPERIDOL LACTATE 5 MG/ML IJ SOLN
5.0000 mg | Freq: Once | INTRAMUSCULAR | Status: AC
  Administered 2016-06-19: 5 mg via INTRAVENOUS

## 2016-06-19 MED ORDER — VITAMIN B-1 100 MG PO TABS
100.0000 mg | ORAL_TABLET | Freq: Every day | ORAL | Status: DC
  Administered 2016-06-19 – 2016-06-21 (×2): 100 mg via ORAL
  Filled 2016-06-19 (×3): qty 1

## 2016-06-19 MED ORDER — ADULT MULTIVITAMIN W/MINERALS CH
1.0000 | ORAL_TABLET | Freq: Every day | ORAL | Status: DC
  Administered 2016-06-19 – 2016-06-21 (×2): 1 via ORAL
  Filled 2016-06-19 (×3): qty 1

## 2016-06-19 MED ORDER — CHLORDIAZEPOXIDE HCL 25 MG PO CAPS
75.0000 mg | ORAL_CAPSULE | Freq: Every day | ORAL | Status: DC
  Administered 2016-06-19: 75 mg via ORAL
  Filled 2016-06-19 (×2): qty 3

## 2016-06-19 MED ORDER — HALOPERIDOL LACTATE 5 MG/ML IJ SOLN
INTRAMUSCULAR | Status: AC
  Administered 2016-06-19: 5 mg via INTRAVENOUS
  Filled 2016-06-19: qty 1

## 2016-06-19 MED ORDER — ATORVASTATIN CALCIUM 20 MG PO TABS
20.0000 mg | ORAL_TABLET | Freq: Every day | ORAL | Status: DC
  Administered 2016-06-21: 20 mg via ORAL
  Filled 2016-06-19: qty 2

## 2016-06-19 MED ORDER — DEXMEDETOMIDINE BOLUS VIA INFUSION
1.0000 ug/kg | Freq: Once | INTRAVENOUS | Status: AC
  Administered 2016-06-19: 90 ug via INTRAVENOUS
  Filled 2016-06-19: qty 90

## 2016-06-19 MED ORDER — LORAZEPAM 1 MG PO TABS
1.0000 mg | ORAL_TABLET | Freq: Four times a day (QID) | ORAL | Status: DC | PRN
  Administered 2016-06-19: 1 mg via ORAL
  Filled 2016-06-19: qty 1

## 2016-06-19 MED ORDER — NICOTINE 14 MG/24HR TD PT24
14.0000 mg | MEDICATED_PATCH | Freq: Every day | TRANSDERMAL | Status: DC
  Administered 2016-06-19 – 2016-06-25 (×6): 14 mg via TRANSDERMAL
  Filled 2016-06-19 (×6): qty 1

## 2016-06-19 MED ORDER — FOLIC ACID 1 MG PO TABS
1.0000 mg | ORAL_TABLET | Freq: Every day | ORAL | Status: DC
  Administered 2016-06-19 – 2016-06-21 (×2): 1 mg via ORAL
  Filled 2016-06-19 (×3): qty 1

## 2016-06-19 MED ORDER — SODIUM CHLORIDE 0.9 % IV SOLN
INTRAVENOUS | Status: DC
  Administered 2016-06-19 – 2016-06-20 (×3): via INTRAVENOUS
  Administered 2016-06-20 – 2016-06-21 (×2): 1000 mL via INTRAVENOUS
  Administered 2016-06-22 – 2016-06-23 (×4): via INTRAVENOUS

## 2016-06-19 MED ORDER — LORAZEPAM 2 MG/ML IJ SOLN
2.0000 mg | INTRAMUSCULAR | Status: AC | PRN
  Administered 2016-06-19 – 2016-06-20 (×5): 2 mg via INTRAVENOUS
  Filled 2016-06-19 (×5): qty 1

## 2016-06-19 MED ORDER — LORAZEPAM 2 MG/ML IJ SOLN
INTRAMUSCULAR | Status: AC
  Administered 2016-06-19: 2 mg via INTRAVENOUS
  Filled 2016-06-19: qty 1

## 2016-06-19 MED ORDER — LORAZEPAM 1 MG PO TABS: 1.0000 mg | ORAL_TABLET | ORAL | Status: DC | PRN

## 2016-06-19 MED ORDER — SODIUM CHLORIDE 0.9 % IV SOLN
0.4000 ug/kg/h | INTRAVENOUS | Status: DC
  Administered 2016-06-19: 0.5 ug/kg/h via INTRAVENOUS
  Administered 2016-06-19 – 2016-06-20 (×2): 0.6 ug/kg/h via INTRAVENOUS
  Administered 2016-06-20: 0.4 ug/kg/h via INTRAVENOUS
  Administered 2016-06-20: 0.5 ug/kg/h via INTRAVENOUS
  Filled 2016-06-19 (×2): qty 4

## 2016-06-19 MED ORDER — DEXMEDETOMIDINE HCL IN NACL 200 MCG/50ML IV SOLN
0.2000 ug/kg/h | INTRAVENOUS | Status: DC
  Administered 2016-06-19: 0.2 ug/kg/h via INTRAVENOUS
  Administered 2016-06-19: 0.7 ug/kg/h via INTRAVENOUS
  Filled 2016-06-19 (×2): qty 50

## 2016-06-19 MED ORDER — LORAZEPAM 2 MG/ML IJ SOLN: 1.0000 mg | INTRAMUSCULAR | Status: DC | PRN

## 2016-06-19 MED ORDER — LORAZEPAM 2 MG/ML IJ SOLN: 1.0000 mg | Freq: Four times a day (QID) | INTRAMUSCULAR | Status: DC | PRN

## 2016-06-19 MED ORDER — THIAMINE HCL 100 MG/ML IJ SOLN: 100.0000 mg | Freq: Every day | INTRAMUSCULAR | Status: DC

## 2016-06-19 NOTE — Progress Notes (Signed)
Dr. Erlinda Hong made aware patient agitated and combative, attempting to get out of bed. Orders received for IV haldol 5mg x1. Continuing to monitor closely.

## 2016-06-19 NOTE — Evaluation (Signed)
Speech Language Pathology Evaluation Patient Details Name: Gabriel Romero MRN: [id number] DOB: 12-Feb-1951 Today's Date: 06/19/2016 Time: 6153-7943 SLP Time Calculation (min) (ACUTE ONLY): 23 min  Problem List:  Patient Active Problem List   Diagnosis Date Noted  . ICH (intracerebral hemorrhage) ([place]) 06/18/2016   Past Medical History: History reviewed. No pertinent past medical history. Past Surgical History: History reviewed. No pertinent surgical history. HPI:  66 y.o.malewith a history of severe hypertension who presents s/p with new onset seizures. CT Head showed acute ICH right lateral temporal lobe. MRI brain pending.    Assessment / Plan / Recommendation Clinical Impression  Pt has delayed processing and moderately impaired sustained attention, which impacts his storage of information as well as his ability to perform multistep instructions and even simple task completion. Even with Max cueing, he recalled 0/4 words after a five minute delay. His intellectual awareness of the above is also decreased. PTA pt was independent and working - recommend additional SLP f/u to maximize functional independence.    SLP Assessment  SLP Recommendation/Assessment: Patient needs continued Speech Lanaguage Pathology Services SLP Visit Diagnosis: Attention and concentration deficit Attention and concentration deficit following: Nontraumatic intracerebral hemorrhage    Follow Up Recommendations  Inpatient Rehab;24 hour supervision/assistance    Frequency and Duration min 2x/week  2 weeks      SLP Evaluation Cognition  Overall Cognitive Status: Impaired/Different from baseline Arousal/Alertness: Awake/alert Orientation Level: Oriented to person;Oriented to place;Disoriented to time;Oriented to situation Attention: Sustained Sustained Attention: Impaired Sustained Attention Impairment: Verbal basic;Functional basic Memory: Impaired Memory Impairment: Storage deficit;Decreased recall of  new information Awareness: Impaired Awareness Impairment: Intellectual impairment;Emergent impairment;Anticipatory impairment Problem Solving: Impaired Problem Solving Impairment: Verbal basic Safety/Judgment: Impaired       Comprehension  Auditory Comprehension Overall Auditory Comprehension: Impaired Commands: Impaired Multistep Basic Commands: 0-24% accurate Conversation: Simple Interfering Components: Attention;Processing speed;Working memory EffectiveTechniques: Extra processing time;Repetition;Slowed speech    Expression Expression Primary Mode of Expression: Verbal Verbal Expression Overall Verbal Expression: Appears within functional limits for tasks assessed   Oral / Motor  Motor Speech Overall Motor Speech: Appears within functional limits for tasks assessed   GO                    Gabriel Romero 06/19/2016, 12:13 PM  Gabriel Romero, M.A. CCC-SLP [phone]

## 2016-06-19 NOTE — Evaluation (Signed)
Physical Therapy Evaluation Patient Details Name: Gabriel Romero MRN: [id number] DOB: June 21, 1950 Today's Date: 06/19/2016   History of Present Illness  Pt is a 66 y.o. male admitted to ED on 06/18/16 with new onset seizures after a fall. CT shows acute hemorrhage in R lateral temporal lobe; C-spine clear. Pertinent PMH includes sevee HTN, alcohol use.    Clinical Impression  Pt presents to PT with generalized weakness, impaired balance, decreased attention, decreased safety awareness, and an overall decrease in functional mobility secondary to above. PTA, pt indep with all mobility and lives at home with son; both work full-time. Today, pt able to perform bed mob and take steps to chair with minA; further mobility limited by pt's impulsivity and decrease in safety awareness; requires multiple redirections to task. Pt would benefit from continued acute PT services to maximize functional mobility and independence.     Follow Up Recommendations CIR;Supervision/Assistance - 24 hour    Equipment Recommendations  Other (comment) (Defer to next venue)    Recommendations for Other Services Rehab consult;OT consult     Precautions / Restrictions Precautions Precautions: Fall Restrictions Weight Bearing Restrictions: No      Mobility  Bed Mobility Overal bed mobility: Needs Assistance Bed Mobility: Supine to Sit     Supine to sit: Min guard     General bed mobility comments: Supine into long-sitting then sitting EOB. Pt impulsive with movement, requiring min guard for safety and maneuvering of lines. Had to stopped multiple times from standing up before lines were ready.   Transfers Overall transfer level: Needs assistance   Transfers: Sit to/from Stand Sit to Stand: Min guard;+2 safety/equipment         General transfer comment: Able to stand on 4th attempt requiring momentum and min guard. R knee instability with standing.   Ambulation/Gait Ambulation/Gait assistance: Min  assist;+2 safety/equipment Ambulation Distance (Feet): 2 Feet Assistive device: None Gait Pattern/deviations: Step-to pattern     General Gait Details: Able to take a few steps to bedside chair, requiring minA +2 for safety secondary to impulsivity with movement.   Stairs            Wheelchair Mobility    Modified Rankin (Stroke Patients Only)       Balance Overall balance assessment: Needs assistance Sitting-balance support: No upper extremity supported;Feet supported Sitting balance-Leahy Scale: Fair     Standing balance support: No upper extremity supported;During functional activity Standing balance-Leahy Scale: Fair Standing balance comment: Right knee instability with standing                             Pertinent Vitals/Pain Pain Assessment: No/denies pain    Home Living Family/patient expects to be discharged to:: Private residence Living Arrangements: Children (Son) Available Help at Discharge: Available PRN/intermittently;Family (Son works full-time) Type of Home: [employer] Access: Stairs to enter Entrance Stairs-Rails: None Technical brewer of Steps: 3 Home Layout: Two level;Bed/bath upstairs Home Equipment: None      Prior Function Level of Independence: Independent         Comments: Pt works full-time; drives.      Hand Dominance        Extremity/Trunk Assessment   Upper Extremity Assessment Upper Extremity Assessment: Generalized weakness    Lower Extremity Assessment Lower Extremity Assessment: Generalized weakness       Communication   Communication: No difficulties  Cognition Arousal/Alertness: Awake/alert Behavior During Therapy: Agitated;Restless Overall Cognitive Status: Impaired/Different from  baseline Area of Impairment: Orientation;Attention;Following commands;Safety/judgement;Awareness;Memory;Problem solving                 Orientation Level: Disoriented to;Time Current Attention Level:  Sustained Memory: Decreased short-term memory Following Commands: Follows one step commands inconsistently;Follows one step commands with increased time;Follows multi-step commands inconsistently Safety/Judgement: Decreased awareness of safety;Decreased awareness of deficits Awareness: Emergent Problem Solving: Requires verbal cues;Slow processing General Comments: Pt easily distracted with significantly decreased attention span, needing to be redirected to tasks multiple times throughout session. Increased perseveration on wanting to wear his pants, from which he was not able to be redirected. Pt also continually attempting to pull off lines despite education on their purpose and importance.      General Comments      Exercises     Assessment/Plan    PT Assessment Patient needs continued PT services  PT Problem List Decreased strength;Decreased mobility;Decreased cognition;Decreased balance;Decreased knowledge of precautions;Decreased safety awareness;Decreased activity tolerance       PT Treatment Interventions Gait training;DME instruction;Therapeutic activities;Cognitive remediation;Patient/family education;Therapeutic exercise;Stair training;Balance training;Neuromuscular re-education;Functional mobility training    PT Goals (Current goals can be found in the Care Plan section)  Acute Rehab PT Goals Patient Stated Goal: Return home PT Goal Formulation: With patient Time For Goal Achievement: 07/03/16 Potential to Achieve Goals: Good    Frequency Min 4X/week   Barriers to discharge Inaccessible home environment;Decreased caregiver support      Co-evaluation               End of Session Equipment Utilized During Treatment: Gait belt Activity Tolerance: Patient tolerated treatment well Patient left: with chair alarm set;in chair;with call bell/phone within reach;with family/visitor present;with nursing/sitter in room Nurse Communication: Mobility status PT Visit  Diagnosis: Unsteadiness on feet (R26.81);Other abnormalities of gait and mobility (R26.89);Muscle weakness (generalized) (M62.81)    Time: 9983-3825 PT Time Calculation (min) (ACUTE ONLY): 28 min   Charges:   PT Evaluation $PT Eval Moderate Complexity: 1 Procedure PT Treatments $Therapeutic Activity: 8-22 mins   PT G Codes:       Enis Gash, SPT Office-[phone]  Mabeline Caras 06/19/2016, 1:27 PM

## 2016-06-19 NOTE — Progress Notes (Signed)
Patient ID: Gabriel Romero, male   DOB: [birth year], 66 y.o.   MRN: [id number] BP (!) 143/76   Pulse 63   Temp 97.8 F (36.6 C) (Axillary)   Resp 19   Ht 5\' 10"  (1.778 m)   Wt 90 kg (198 lb 6.6 oz)   SpO2 97%   BMI 28.47 kg/m  Currently sedated. Has been agitated earlier Repeat head ct is unchanged Still cannot explain his exam well No new recs

## 2016-06-19 NOTE — Progress Notes (Signed)
Upon Assessment pt very difficult  to arouse compared with previous assessment. Would not keep eyes open. MD Lindzen called with change STAT CT ordered.     In CT patient became agitated. MD called and 1mg  ativan X 2 doses PRN ordered for scan.   To initiate CIWA protocol.

## 2016-06-19 NOTE — Progress Notes (Signed)
Unable to perform EEG at this time. Pt is combative and agitated. Will attempt tomorrow when schedule permits. Nurse aware

## 2016-06-19 NOTE — Progress Notes (Signed)
Dr. Erlinda Hong at bedside. Patient agitated, combative, verbally aggressive, BP= 166/134, climbing out of bed. 5mg  haldol IV given x1.

## 2016-06-19 NOTE — Progress Notes (Signed)
Physical medicine rehabilitation consult requested chart reviewed. Currently very agitated question seizure versus alcohol withdrawal. Dr.Xu has requested to hold on formal rehabilitation consult until Friday, 06/20/2016

## 2016-06-19 NOTE — Progress Notes (Signed)
Dr. Erlinda Hong made aware precedex gtt placed on hold s/t SB with HR 49-52, patient remains sedated, responds to pain on all 4 extremities, pupils equal, 2 and sluggish, having short periods of apnea at times but arouses with spontaneous respirations on own and to pain. Daughter at bedside and updated by MD via phone. Also made aware patient probably has undiagnosed sleep apnea based off what daughter describes patient doing at home when sleep such as "hearing snoring then a pause and then snoring again". Patient on 3L [place] with O2 sats >93%. Continuing with current orders. Continuing to monitor.

## 2016-06-19 NOTE — Progress Notes (Addendum)
Called by nurse to bedside. Pt extremely agitated, combative, confusion and tried to get out of bed. His condition consistent with alcohol withdraw DT. Given 5mg  IV haldol, 2mg  IV ativan and started precedex bolus and infusion for DT management. Pt initially still agitated, was put on restrain, but later became sedated but responsive on stimulation, moving all extremities. precedex was on hold due to bradycardia and apnea at times. Still able to protect airway, do not think need intubation at that time. Continue close observation, can resume precedex as needed. If neuro changes, need stat CT head. If respiratory compromised, need CCM consult and may consider intubation.   I talked with daughter over the phone, updated her about pt current condition, treatment plan, potential intubation, and alcohol withdraw complication. She expressed understanding.  Rosalin Hawking, MD PhD Stroke Neurology 06/19/2016 8:27 PM

## 2016-06-19 NOTE — Progress Notes (Signed)
OT Cancellation Note  Patient Details Name: Gabriel Romero MRN: [id number] DOB: 13-Aug-1950   Cancelled Treatment:    Reason Eval/Treat Not Completed: Patient not medically ready (active bedrest orders). Will follow up for OT eval as time allows.  Binnie Kand M.S., OTR/L Pager: [phone]  06/19/2016, 7:37 AM

## 2016-06-19 NOTE — Plan of Care (Signed)
Problem: Education: Goal: Knowledge of disease or condition will improve Outcome: Not Progressing Patient too ill for education at this time. Discussed with daughter but needs constant reinforcement.

## 2016-06-19 NOTE — Plan of Care (Signed)
Problem: Safety: Goal: Ability to remain free from injury will improve Outcome: Not Progressing Patient in DTs, requiring PRN haldol, ativan and precedex gtt this shift. Reinforced behaviors for safety. Nursing to continue to monitor.

## 2016-06-19 NOTE — Progress Notes (Signed)
Rehab Admissions Coordinator Note:  Patient was screened by Retta Diones for appropriateness for an Inpatient Acute Rehab Consult.  At this time, we are recommending Inpatient Rehab consult.  Jodell Cipro M 06/19/2016, 2:07 PM  I can be reached at [phone].

## 2016-06-19 NOTE — Plan of Care (Signed)
Problem: Nutrition: Goal: Adequate nutrition will be maintained Outcome: Not Progressing Patient too sedated for PO intake. MIVF @ 75 cc/hr, will re-evaluate as patient's condition from withdrawal symptoms improve.

## 2016-06-19 NOTE — Progress Notes (Signed)
Dr. Erlinda Hong at bedside aware patient at 0.7 mcg/kg/hr. 39mcg/kg IV precedex bolus given per MD order with MD at bedside. BP 237/124. Cleviprex gtt restarted at 1mg /hr. Titrating per order. Continuing to monitor closely.

## 2016-06-19 NOTE — Progress Notes (Signed)
Dr. Erlinda Hong at bedside and updated on patient condition: aware patient off cleviprex since 0615 this am, CIWA score 10 this am- aware patient is a very heavy drinker with last drink being Tuesday night and required haldol and ativan last night; discussed starting precedex gtt vs PRN ativan. Will continue with PRN ativan doses per MD. Cleared from bedrest per MD and ok to eat. Also made aware patient is at least 1ppd smoker x40years. Nicotine patch ordered. Continuing to monitor.

## 2016-06-19 NOTE — Progress Notes (Signed)
STROKE TEAM PROGRESS NOTE   SUBJECTIVE (INTERVAL HISTORY) Patient was seen and examined this morning. He states that yesterday he fell as he was carrying in bread to [employer]. He states that he fell flat, on his hands and front. He denies hitting his head. He worked the rest of the day, but later had a seizure at [employer]. At the time of the seizure, he was writing checks. He denies history of seizure or brain bleed. Patient does have a history of hypertension, but is not on antihypertensives. Daughter reports a history of "aneurysms" but is unable to state where.   Later today, patient became verbally aggressive, climbing out of the bed. Haldol was given and ordered placed for precedex.   Patient smokes 1 ppd for 40 years.    OBJECTIVE  Recent Labs Lab 06/18/16 1522  GLUCAP 173*    Recent Labs Lab 06/18/16 1530 06/19/16 1225  NA 140 140  K 3.7 3.5  CL 106 106  CO2 19* 22  GLUCOSE 188* 120*  BUN 15 17  CREATININE 1.56* 1.62*  CALCIUM 8.8* 9.1    Recent Labs Lab 06/18/16 1530  AST 27  ALT 18  ALKPHOS 69  BILITOT 1.3*  PROT 7.4  ALBUMIN 4.2    Recent Labs Lab 06/18/16 1530 06/19/16 1225  WBC 11.5* 9.8  HGB 15.7 14.9  HCT 46.3 44.6  MCV 94.1 94.3  PLT 211 204    Recent Labs  06/18/16 1530  LABPROT 13.9  INR 1.07    Recent Labs  06/18/16 1743  COLORURINE YELLOW  LABSPEC 1.011  PHURINE 6.0  GLUCOSEU 50*  HGBUR SMALL*  BILIRUBINUR NEGATIVE  KETONESUR NEGATIVE  PROTEINUR 100*  NITRITE NEGATIVE  LEUKOCYTESUR NEGATIVE        Component Value Date/Time   LABOPIA NONE DETECTED 06/18/2016 1743   COCAINSCRNUR NONE DETECTED 06/18/2016 1743   LABBENZ POSITIVE (A) 06/18/2016 1743   AMPHETMU NONE DETECTED 06/18/2016 1743   THCU NONE DETECTED 06/18/2016 1743   LABBARB NONE DETECTED 06/18/2016 1743     Recent Labs Lab 06/18/16 1530  ETH <5    I have personally reviewed the radiological images below and agree with the radiology  interpretations.  Ct Angio Head W Or Wo Contrast  Result Date: 06/18/2016 CLINICAL DATA:  Initial evaluation for acute intracranial hemorrhage. EXAM: CT ANGIOGRAPHY HEAD TECHNIQUE: Multidetector CT imaging of the head was performed using the standard protocol during bolus administration of intravenous contrast. Multiplanar CT image reconstructions and MIPs were obtained to evaluate the vascular anatomy. CONTRAST:  50 cc of Isovue 370. COMPARISON:  Prior CT from earlier the same day. FINDINGS: CTA HEAD Anterior circulation: Distal cervical segments of the internal carotid arteries are well opacified and widely patent. Petrous segments widely patent bilaterally. Scattered calcified atheromatous plaque throughout the cavernous/ supraclinoid ICAs. Resultant moderate to advanced multifocal narrowing (approximately 50-75%), slightly worse to the left. Note made of a focal moderate stenosis of approximately 50% within the proximal cavernous left ICA (series 502, image 46). The ICA termini widely patent bilaterally. A1 segments widely patent. Anterior communicating artery normal. Anterior cerebral arteries patent to their distal aspects. M1 segments patent without stenosis or occlusion. No proximal M2 occlusion. Distal MCA branches well opacified and symmetric. No vascular abnormality seen underlying the peripheral right temporal lobe hemorrhage. Posterior circulation: Visualized vertebral arteries widely patent to the vertebrobasilar junction. Left PICA patent proximally. Right PICA not well visualized. Basilar artery widely patent to its distal aspect. Superior cerebral arteries  patent bilaterally. Both of the posterior cerebral arteries primarily supplied via the basilar and are widely patent to their distal aspects. Venous sinuses: Not well evaluated on this exam due to timing of the contrast bolus. No obvious venous sinus thrombosis. Anatomic variants: No significant anatomic variant. No aneurysm or vascular  malformation. No abnormality seen underlying the right temporal hemorrhage. Delayed phase: No pathologic enhancement. Previously identified hemorrhage within the right temporal lobe is relatively stable measuring 2.6 x 1.7 x 1.8 cm. IMPRESSION: 1. Negative CTA of the brain. No acute vascular abnormality identified. No AVM or other vascular abnormality seen underlying the right temporal lobe hemorrhage. 2. Atheromatous plaque involving the carotid siphons with moderate to advanced multifocal narrowing (approximately 50-75%), left greater than right. 3. No significant interval change in size and appearance of right temporal lobe hemorrhage, measuring 2.6 x 1.7 x 1.8 cm. No significant mass effect. Electronically Signed   By: Jeannine Boga M.D.   On: 06/18/2016 19:14   Ct Head Wo Contrast  Result Date: 06/19/2016 CLINICAL DATA:  Follow-up of intracranial hemorrhage. EXAM: CT HEAD WITHOUT CONTRAST TECHNIQUE: Contiguous axial images were obtained from the base of the skull through the vertex without intravenous contrast. COMPARISON:  CT angiogram and CT of the head dated 06/18/2016. FINDINGS: Brain: Stable hematoma within the right temporal lobe. No new acute large territory infarct, intracranial hemorrhage, or focal mass effect identified. Advanced chronic microvascular ischemic changes of white matter, chronic right parietal infarct, multiple small chronic lacunar infarcts within the basal ganglia, and mild brain parenchymal volume loss are stable. Stable ventricle size. Vascular: Extensive calcific atherosclerosis of cavernous and paraclinoid internal carotid arteries. Skull: Mildly displaced acute fracture deformity of the nasal bones with mild displacement. No displaced calvarial fracture. Sinuses/Orbits: Mild diffuse ethmoid sinus mucosal thickening and mucous retention cysts within the left maxillary sinus. Chronic left lamina papyracea fracture with herniation of extra orbital fat and mild medial  displacement of the medial rectus muscle. Other: Periapical lucencies of multiple teeth in the maxillary alveolar bone compatible with odontogenic disease. IMPRESSION: 1. Stable acute hemorrhage within the right lateral temporal lobe. This area is atypical for hypertensive hemorrhage and traumatic hemorrhagic contusion or underlying hemorrhagic mass should be considered. 2. Stable advanced chronic microvascular ischemic changes of the brain, chronic infarcts, and volume loss. 3. Acute appearing mildly displaced nasal bone fractures and chronic left lamina papyracea fracture are stable. Electronically Signed   By: Kristine Garbe M.D.   On: 06/19/2016 00:35   Ct Head Wo Contrast  Result Date: 06/18/2016 CLINICAL DATA:  Altered mental status.  Seizure activity. EXAM: CT HEAD WITHOUT CONTRAST TECHNIQUE: Contiguous axial images were obtained from the base of the skull through the vertex without intravenous contrast. COMPARISON:  None. FINDINGS: Brain: Acute hemorrhage in the right lateral temporal lobe measures 27 x 19 x 13 mm. Hemorrhage volume 3.3 mL. No significant surrounding edema or mass. Mild atrophy without hydrocephalus. Hypodensity throughout the cerebral white matter bilaterally most likely chronic microvascular ischemia. Hypodensity in the right parietal lobe involving white matter and cortex. This is most compatible with infarction, probably old but difficult to state with certainty. No other areas of hemorrhage. No shift of the midline structures. No subarachnoid hemorrhage or subdural hemorrhage. Vascular: Arterial calcification in the cavernous carotid bilaterally. Negative for hyperdense vessel. Skull: Negative for skull fracture. No significant scalp soft tissue swelling. Chronic fracture left medial orbit. Sinuses/Orbits: Mucosal edema in the paranasal sinuses. Other: None IMPRESSION: Small area of acute hemorrhage in  the right lateral temporal lobe. Hemorrhage volume 3.3 mL. No  significant surrounding edema or mass lesion. Differential considerations would include a traumatic hemorrhagic contusion. Venous infarction is possible. Hemorrhagic mass or infarct are consider less likely. Further evaluation with MRI and CTA suggested. Generalized atrophy with diffuse white matter disease most likely chronic. Hypodensity in the right parietal lobe most likely a chronic infarct. These results were called by telephone at the time of interpretation on 06/18/2016 at 3:59 pm to Dr. Zenovia Jarred , who verbally acknowledged these results. Electronically Signed   By: Franchot Gallo M.D.   On: 06/18/2016 16:00   Ct Cervical Spine Wo Contrast  Result Date: 06/18/2016 CLINICAL DATA:  Combative with seizure-like activity at work today. EXAM: CT CERVICAL SPINE WITHOUT CONTRAST TECHNIQUE: Multidetector CT imaging of the cervical spine was performed without intravenous contrast. Multiplanar CT image reconstructions were also generated. COMPARISON:  None. FINDINGS: Despite efforts by the technologist and patient, mild motion artifact is present on today's exam and could not be eliminated. This reduces exam sensitivity and specificity. Alignment: Straightening without focal angulation or listhesis. Vertebrae: No evidence of acute fracture or traumatic subluxation. No focal osseous lesions are seen. Paraspinal tissues: No paraspinal inflammatory changes or fluid collections are demonstrated. There is carotid atherosclerosis bilaterally. Disc levels: There is multilevel spondylosis with disc space narrowing, uncinate spurring and facet hypertrophy. These findings contribute to mild to moderate spinal stenosis at C5-6 and C6-7. In addition, there is moderate osseous foraminal narrowing bilaterally from C3-4 through C5-6. No acute soft disc herniation noted. IMPRESSION: No evidence of acute cervical spine fracture, traumatic subluxation or static signs of instability. Multilevel spondylosis, contributing to  spinal and foraminal narrowing as described. Electronically Signed   By: Richardean Sale M.D.   On: 06/18/2016 16:02    Carotid Doppler: Preliminary findings: Consistent with a high end 60 - 79 percent stenosis involving the proximal left internal carotid artery. Findings consistent with a high end 1- 39 percent stenosis involving the proximal right internal carotid artery. Intimal thickening vs soft plaque bilaterally.  Bilateral vertebral arteries are patent and antegrade.  2D Echocardiogram  pending  PHYSICAL EXAM Vitals:   06/19/16 1145 06/19/16 1200 06/19/16 1230 06/19/16 1245  BP: (!) 149/86 140/90 119/80 115/78  Pulse: 82 84 84 78  Resp: (!) 23 20 20 16   Temp:  97.6 F (36.4 C)    TempSrc:  Oral    SpO2: 94% 95% 96% 97%  Weight:      Height:       General: Vital signs reviewed.  Patient is well-developed and well-nourished, in no acute distress and cooperative with exam.  Eyes: PERRLA, EOMI Neck: Supple, trachea midline Cardiovascular: RRR, S1 normal, S2 normal, no murmurs, gallops, or rubs. Pulmonary/Chest: Clear to auscultation bilaterally, no wheezes, rales, or rhonchi. Abdominal: Soft, non-tender, non-distended, BS + Extremities: No lower extremity edema bilaterally, pulses symmetric and intact bilaterally.  Neuro: AAOx3, follows commands, naming intact for some objects, but not all, moving all 4 extremites, sensation intact bilaterally, repetition intact, able to spell WORLD forwards but not backwards.   ASSESSMENT/PLAN Mr. DEUNTE BLEDSOE is a 66 y.o. male with history of hypertension, alcohol abuse admitted fseizure and found to have an intracranial temporal lobe hemorrhage.   Right Temporal lobe intraparenchymal hematoma: Unclear if the hematoma is secondary to fall, tumor, aneurysm or AVM.   CT head: Small area of acute hemorrhage in the right lateral temporal lobe. Hemorrhage volume 3.3 mL. No significant surrounding  edema or mass lesion. Hypodensity in the right  parietal lobe most likely a chronic infarct.  CTA Brain: Negative CTA of the brain. No AVM or other vascular abnormality seen underlying the right temporal lobe hemorrhage. Atheromatous plaque involving the carotid siphons with moderate to advanced multifocal narrowing (approximately 50-75%), left greater than right.  MRI pending   Carotid Doppler: Preliminary findings: Consistent with a high end 60 - 79 percent stenosis involving the proximal left internal carotid artery. Findings consistent with a high end 1- 39 percent stenosis involving the proximal right internal carotid artery. Intimal thickening vs soft plaque bilaterally.  Bilateral vertebral arteries are patent and antegrade.  2D Echo pending  LDL 123  HgbA1c pending  SCDs for VTE prophylaxis given ICH  Diet Heart Room service appropriate? Yes; Fluid consistency: Thin   No antithrombotic prior to admission, now on No antithrombotic  Ongoing aggressive stroke risk factor management  Therapy recommendations:  CIR  Disposition:  Pending  Seizure:  On Keppra 500 mg BID  EEG pending  No history of seizures or recurrent seizures  Doubt that seizures is 2/2 to alcohol withdrawal as patient denied abstaining from daily alcohol use. Alcohol level on admission <5, UDS positive for benzos, UA negative  Diabetes  HgbA1c pending goal < 7.0  CBG monitoring  Hypertension  Home meds: None  history of uncontrolled HTN, but not taking any medications at home Maintain BP [id number] systolic-Cleviprex prn  Currently on no antihypertensive therapy given low-normal BP  Stable  Hyperlipidemia  Home meds: None  Currently on atorvastatin 20 mg QD  LDL 123, goal < 70  Continue statin at discharge  Other Stroke Risk Factors  Cigarette smoker, advised to stop smoking  ETOH use  Obesity, Body mass index is 28.47 kg/m.   Other Active Problems  CKD III:  Creatinine 1.5-1.6   Alcohol Abuse: Drinks 8 beers per day. On  CIWA, Precedex   Tobacco abuse- Nicotine patch  Hospital day # [place], DO PGY-3 Internal Medicine Resident Pager # [phone] 06/19/2016 1:57 PM  To contact Stroke Continuity provider, please refer to [email or web address] After hours, contact General Neurology

## 2016-06-19 NOTE — Progress Notes (Signed)
*  PRELIMINARY RESULTS* Vascular Ultrasound Carotid Duplex (Doppler) has been completed.  Preliminary findings: Consistent with a high end 60 - 79 percent stenosis involving the proximal left internal carotid artery. Findings consistent with a high end 1- 39 percent stenosis involving the proximal right internal carotid artery. Intimal thickening vs soft plaque bilaterally.  Bilateral vertebral arteries are patent and antegrade.  Everrett Coombe 06/19/2016, 12:01 PM

## 2016-06-19 NOTE — Progress Notes (Signed)
PT Cancellation Note  Patient Details Name: Gabriel Romero MRN: [id number] DOB: 1951-03-03   Cancelled Treatment:    Reason Eval/Treat Not Completed: Patient not medically ready. Pt on bedrest; will follow-up with PT eval when medically appropriate as time allows.   Enis Gash, SPT Office-[phone]  Mabeline Caras 06/19/2016, 8:25 AM

## 2016-06-19 NOTE — Consult Note (Signed)
Physical Medicine and Rehabilitation Consult Reason for Consult: Confusion and agitation with decreased functional mobility Referring Physician: Dr.Xu   HPI: Gabriel Romero is a 66 y.o. right handed male on no prescription medication with history of untreated hypertension and tobacco/ alcohol use. Per chart review patient independent prior to admission living with son. His son works full time. Two-level home with bedroom and bathroom upstairs. Presented 06/18/2016 after he slipped and fell on some cracked eggs. He did not lose consciousness. He continued to work until noted seizure and was noted to be combative. UDS positive benzodiazepine. CT of the head showed a ICH temporal lobe 2.6 x 1.7 x 1.8 cm with no significant  mass effect. Neurosurgery Dr. Dayton Bailiff advised conservative care. Follow-up CT of the head 06/18/2016 stable. CT cervical spine negative. . Maintained on Keppra for seizure prophylaxis. Blood pressure elevated maintained on cleviprex. EEG and echocardiogram are pending. Patient with ongoing bouts of agitation, combative and restless with suspect alcohol withdrawal. Physical therapy evaluation completed 06/19/2016 with recommendations of physical medicine rehabilitation consult.   Review of Systems  Unable to perform ROS: Acuity of condition   History reviewed. No pertinent past medical history. History reviewed. No pertinent surgical history. History reviewed. No pertinent family history. Social History:  reports that he has never smoked. He does not have any smokeless tobacco history on file. He reports that he drinks alcohol. His drug history is not on file. Allergies:  Allergies  Allergen Reactions  . Sulfa Antibiotics Rash   No prescriptions prior to admission.    Home: Home Living Family/patient expects to be discharged to:: Private residence Living Arrangements: Children (Son) Available Help at Discharge: Available PRN/intermittently, Family (Son works  full-time) Type of Home: [employer] Home Access: Stairs to enter Technical brewer of Steps: 3 Entrance Stairs-Rails: None Home Layout: Two level, Bed/bath upstairs Alternate Level Stairs-Number of Steps: 15 Alternate Level Stairs-Rails: None Home Equipment: None  Functional History: Prior Function Level of Independence: Independent Comments: Pt works full-time; drives.  Functional Status:  Mobility: Bed Mobility Overal bed mobility: Needs Assistance Bed Mobility: Supine to Sit Supine to sit: Min guard General bed mobility comments: Supine into long-sitting then sitting EOB. Pt impulsive with movement, requiring min guard for safety and maneuvering of lines. Had to stopped multiple times from standing up before lines were ready.  Transfers Overall transfer level: Needs assistance Transfers: Sit to/from Stand Sit to Stand: Min guard, +2 safety/equipment General transfer comment: Able to stand on 4th attempt requiring momentum and min guard. R knee instability with standing.  Ambulation/Gait Ambulation/Gait assistance: Min assist, +2 safety/equipment Ambulation Distance (Feet): 2 Feet Assistive device: None Gait Pattern/deviations: Step-to pattern General Gait Details: Able to take a few steps to bedside chair, requiring minA +2 for safety secondary to impulsivity with movement.     ADL:    Cognition: Cognition Overall Cognitive Status: Impaired/Different from baseline Arousal/Alertness: Awake/alert Orientation Level: Oriented to person, Oriented to place, Disoriented to time, Disoriented to situation Attention: Sustained Sustained Attention: Impaired Sustained Attention Impairment: Verbal basic, Functional basic Memory: Impaired Memory Impairment: Storage deficit, Decreased recall of new information Awareness: Impaired Awareness Impairment: Intellectual impairment, Emergent impairment, Anticipatory impairment Problem Solving: Impaired Problem Solving Impairment: Verbal  basic Safety/Judgment: Impaired Cognition Arousal/Alertness: Awake/alert Behavior During Therapy: Agitated, Restless Overall Cognitive Status: Impaired/Different from baseline Area of Impairment: Orientation, Attention, Following commands, Safety/judgement, Awareness, Memory, Problem solving Orientation Level: Disoriented to, Time Current Attention Level: Sustained Memory: Decreased short-term memory Following  Commands: Follows one step commands inconsistently, Follows one step commands with increased time, Follows multi-step commands inconsistently Safety/Judgement: Decreased awareness of safety, Decreased awareness of deficits Awareness: Emergent Problem Solving: Requires verbal cues, Slow processing General Comments: Pt easily distracted with significantly decreased attention span, needing to be redirected to tasks multiple times throughout session. Increased perseveration on wanting to wear his pants, from which he was not able to be redirected. Pt also continually attempting to pull off lines despite education on their purpose and importance.  Blood pressure 115/78, pulse 78, temperature 97.6 F (36.4 C), temperature source Oral, resp. rate 16, height 5\' 10"  (1.778 m), weight 90 kg (198 lb 6.6 oz), SpO2 97 %. Physical Exam  Eyes:  Pupils sluggish to light  Neck: Normal range of motion. Neck supple. No thyromegaly present.  Cardiovascular: Normal rate and regular rhythm.   Respiratory:  Decreased breath sounds with limited inspiratory effort due to lack of per dissipation with exam  GI: Soft. Bowel sounds are normal. He exhibits no distension.  Neurological:  Patient is sedated and difficult to arouse which did limit overall exam. Withdrawal to deep stimuli. Moves all 4's. Senses pain in all 4's.   Psychiatric:  Agitated, irritable.     Results for orders placed or performed during the hospital encounter of 06/18/16 (from the past 24 hour(s))  CBG monitoring, ED     Status:  Abnormal   Collection Time: 06/18/16  3:22 PM  Result Value Ref Range   Glucose-Capillary 173 (H) 65 - 99 mg/dL  Comprehensive metabolic panel     Status: Abnormal   Collection Time: 06/18/16  3:30 PM  Result Value Ref Range   Sodium 140 135 - 145 mmol/L   Potassium 3.7 3.5 - 5.1 mmol/L   Chloride 106 101 - 111 mmol/L   CO2 19 (L) 22 - 32 mmol/L   Glucose, Bld 188 (H) 65 - 99 mg/dL   BUN 15 6 - 20 mg/dL   Creatinine, Ser 1.56 (H) 0.61 - 1.24 mg/dL   Calcium 8.8 (L) 8.9 - 10.3 mg/dL   Total Protein 7.4 6.5 - 8.1 g/dL   Albumin 4.2 3.5 - 5.0 g/dL   AST 27 15 - 41 U/L   ALT 18 17 - 63 U/L   Alkaline Phosphatase 69 38 - 126 U/L   Total Bilirubin 1.3 (H) 0.3 - 1.2 mg/dL   GFR calc non Af Amer 45 (L) >60 mL/min   GFR calc Af Amer 52 (L) >60 mL/min   Anion gap 15 5 - 15  CBC     Status: Abnormal   Collection Time: 06/18/16  3:30 PM  Result Value Ref Range   WBC 11.5 (H) 4.0 - 10.5 K/uL   RBC 4.92 4.22 - 5.81 MIL/uL   Hemoglobin 15.7 13.0 - 17.0 g/dL   HCT 46.3 39.0 - 52.0 %   MCV 94.1 78.0 - 100.0 fL   MCH 31.9 26.0 - 34.0 pg   MCHC 33.9 30.0 - 36.0 g/dL   RDW 13.0 11.5 - 15.5 %   Platelets 211 150 - 400 K/uL  Ethanol     Status: None   Collection Time: 06/18/16  3:30 PM  Result Value Ref Range   Alcohol, Ethyl (B) <5 <5 mg/dL  Type and screen [place]     Status: None   Collection Time: 06/18/16  3:30 PM  Result Value Ref Range   ABO/RH(D) A POS    Antibody Screen NEG  Sample Expiration 06/21/2016   Protime-INR     Status: None   Collection Time: 06/18/16  3:30 PM  Result Value Ref Range   Prothrombin Time 13.9 11.4 - 15.2 seconds   INR 1.07   ABO/Rh     Status: None   Collection Time: 06/18/16  3:30 PM  Result Value Ref Range   ABO/RH(D) A POS   Rapid urine drug screen (hospital performed)     Status: Abnormal   Collection Time: 06/18/16  5:43 PM  Result Value Ref Range   Opiates NONE DETECTED NONE DETECTED   Cocaine NONE DETECTED NONE  DETECTED   Benzodiazepines POSITIVE (A) NONE DETECTED   Amphetamines NONE DETECTED NONE DETECTED   Tetrahydrocannabinol NONE DETECTED NONE DETECTED   Barbiturates NONE DETECTED NONE DETECTED  Urinalysis, Routine w reflex microscopic     Status: Abnormal   Collection Time: 06/18/16  5:43 PM  Result Value Ref Range   Color, Urine YELLOW YELLOW   APPearance CLEAR CLEAR   Specific Gravity, Urine 1.011 1.005 - 1.030   pH 6.0 5.0 - 8.0   Glucose, UA 50 (A) NEGATIVE mg/dL   Hgb urine dipstick SMALL (A) NEGATIVE   Bilirubin Urine NEGATIVE NEGATIVE   Ketones, ur NEGATIVE NEGATIVE mg/dL   Protein, ur 100 (A) NEGATIVE mg/dL   Nitrite NEGATIVE NEGATIVE   Leukocytes, UA NEGATIVE NEGATIVE   RBC / HPF 0-5 0 - 5 RBC/hpf   WBC, UA 0-5 0 - 5 WBC/hpf   Bacteria, UA RARE (A) NONE SEEN   Squamous Epithelial / LPF NONE SEEN NONE SEEN   Mucous PRESENT    Hyaline Casts, UA PRESENT   MRSA PCR Screening     Status: None   Collection Time: 06/18/16  7:00 PM  Result Value Ref Range   MRSA by PCR NEGATIVE NEGATIVE  CBC     Status: None   Collection Time: 06/19/16 12:25 PM  Result Value Ref Range   WBC 9.8 4.0 - 10.5 K/uL   RBC 4.73 4.22 - 5.81 MIL/uL   Hemoglobin 14.9 13.0 - 17.0 g/dL   HCT 44.6 39.0 - 52.0 %   MCV 94.3 78.0 - 100.0 fL   MCH 31.5 26.0 - 34.0 pg   MCHC 33.4 30.0 - 36.0 g/dL   RDW 13.2 11.5 - 15.5 %   Platelets 204 150 - 400 K/uL  Basic metabolic panel     Status: Abnormal   Collection Time: 06/19/16 12:25 PM  Result Value Ref Range   Sodium 140 135 - 145 mmol/L   Potassium 3.5 3.5 - 5.1 mmol/L   Chloride 106 101 - 111 mmol/L   CO2 22 22 - 32 mmol/L   Glucose, Bld 120 (H) 65 - 99 mg/dL   BUN 17 6 - 20 mg/dL   Creatinine, Ser 1.62 (H) 0.61 - 1.24 mg/dL   Calcium 9.1 8.9 - 10.3 mg/dL   GFR calc non Af Amer 43 (L) >60 mL/min   GFR calc Af Amer 49 (L) >60 mL/min   Anion gap 12 5 - 15  Lipid panel     Status: Abnormal   Collection Time: 06/19/16 12:25 PM  Result Value Ref Range     Cholesterol 205 (H) 0 - 200 mg/dL   Triglycerides 123 <150 mg/dL   HDL 57 >40 mg/dL   Total CHOL/HDL Ratio 3.6 RATIO   VLDL 25 0 - 40 mg/dL   LDL Cholesterol 123 (H) 0 - 99 mg/dL  TSH  Status: None   Collection Time: 06/19/16 12:25 PM  Result Value Ref Range   TSH 3.656 0.350 - 4.500 uIU/mL  Vitamin B12     Status: None   Collection Time: 06/19/16 12:25 PM  Result Value Ref Range   Vitamin B-12 213 180 - 914 pg/mL   Ct Angio Head W Or Wo Contrast  Result Date: 06/18/2016 CLINICAL DATA:  Initial evaluation for acute intracranial hemorrhage. EXAM: CT ANGIOGRAPHY HEAD TECHNIQUE: Multidetector CT imaging of the head was performed using the standard protocol during bolus administration of intravenous contrast. Multiplanar CT image reconstructions and MIPs were obtained to evaluate the vascular anatomy. CONTRAST:  50 cc of Isovue 370. COMPARISON:  Prior CT from earlier the same day. FINDINGS: CTA HEAD Anterior circulation: Distal cervical segments of the internal carotid arteries are well opacified and widely patent. Petrous segments widely patent bilaterally. Scattered calcified atheromatous plaque throughout the cavernous/ supraclinoid ICAs. Resultant moderate to advanced multifocal narrowing (approximately 50-75%), slightly worse to the left. Note made of a focal moderate stenosis of approximately 50% within the proximal cavernous left ICA (series 502, image 46). The ICA termini widely patent bilaterally. A1 segments widely patent. Anterior communicating artery normal. Anterior cerebral arteries patent to their distal aspects. M1 segments patent without stenosis or occlusion. No proximal M2 occlusion. Distal MCA branches well opacified and symmetric. No vascular abnormality seen underlying the peripheral right temporal lobe hemorrhage. Posterior circulation: Visualized vertebral arteries widely patent to the vertebrobasilar junction. Left PICA patent proximally. Right PICA not well visualized.  Basilar artery widely patent to its distal aspect. Superior cerebral arteries patent bilaterally. Both of the posterior cerebral arteries primarily supplied via the basilar and are widely patent to their distal aspects. Venous sinuses: Not well evaluated on this exam due to timing of the contrast bolus. No obvious venous sinus thrombosis. Anatomic variants: No significant anatomic variant. No aneurysm or vascular malformation. No abnormality seen underlying the right temporal hemorrhage. Delayed phase: No pathologic enhancement. Previously identified hemorrhage within the right temporal lobe is relatively stable measuring 2.6 x 1.7 x 1.8 cm. IMPRESSION: 1. Negative CTA of the brain. No acute vascular abnormality identified. No AVM or other vascular abnormality seen underlying the right temporal lobe hemorrhage. 2. Atheromatous plaque involving the carotid siphons with moderate to advanced multifocal narrowing (approximately 50-75%), left greater than right. 3. No significant interval change in size and appearance of right temporal lobe hemorrhage, measuring 2.6 x 1.7 x 1.8 cm. No significant mass effect. Electronically Signed   By: Jeannine Boga M.D.   On: 06/18/2016 19:14   Ct Head Wo Contrast  Result Date: 06/19/2016 CLINICAL DATA:  Follow-up of intracranial hemorrhage. EXAM: CT HEAD WITHOUT CONTRAST TECHNIQUE: Contiguous axial images were obtained from the base of the skull through the vertex without intravenous contrast. COMPARISON:  CT angiogram and CT of the head dated 06/18/2016. FINDINGS: Brain: Stable hematoma within the right temporal lobe. No new acute large territory infarct, intracranial hemorrhage, or focal mass effect identified. Advanced chronic microvascular ischemic changes of white matter, chronic right parietal infarct, multiple small chronic lacunar infarcts within the basal ganglia, and mild brain parenchymal volume loss are stable. Stable ventricle size. Vascular: Extensive calcific  atherosclerosis of cavernous and paraclinoid internal carotid arteries. Skull: Mildly displaced acute fracture deformity of the nasal bones with mild displacement. No displaced calvarial fracture. Sinuses/Orbits: Mild diffuse ethmoid sinus mucosal thickening and mucous retention cysts within the left maxillary sinus. Chronic left lamina papyracea fracture with herniation of extra  orbital fat and mild medial displacement of the medial rectus muscle. Other: Periapical lucencies of multiple teeth in the maxillary alveolar bone compatible with odontogenic disease. IMPRESSION: 1. Stable acute hemorrhage within the right lateral temporal lobe. This area is atypical for hypertensive hemorrhage and traumatic hemorrhagic contusion or underlying hemorrhagic mass should be considered. 2. Stable advanced chronic microvascular ischemic changes of the brain, chronic infarcts, and volume loss. 3. Acute appearing mildly displaced nasal bone fractures and chronic left lamina papyracea fracture are stable. Electronically Signed   By: Kristine Garbe M.D.   On: 06/19/2016 00:35   Ct Head Wo Contrast  Result Date: 06/18/2016 CLINICAL DATA:  Altered mental status.  Seizure activity. EXAM: CT HEAD WITHOUT CONTRAST TECHNIQUE: Contiguous axial images were obtained from the base of the skull through the vertex without intravenous contrast. COMPARISON:  None. FINDINGS: Brain: Acute hemorrhage in the right lateral temporal lobe measures 27 x 19 x 13 mm. Hemorrhage volume 3.3 mL. No significant surrounding edema or mass. Mild atrophy without hydrocephalus. Hypodensity throughout the cerebral white matter bilaterally most likely chronic microvascular ischemia. Hypodensity in the right parietal lobe involving white matter and cortex. This is most compatible with infarction, probably old but difficult to state with certainty. No other areas of hemorrhage. No shift of the midline structures. No subarachnoid hemorrhage or subdural  hemorrhage. Vascular: Arterial calcification in the cavernous carotid bilaterally. Negative for hyperdense vessel. Skull: Negative for skull fracture. No significant scalp soft tissue swelling. Chronic fracture left medial orbit. Sinuses/Orbits: Mucosal edema in the paranasal sinuses. Other: None IMPRESSION: Small area of acute hemorrhage in the right lateral temporal lobe. Hemorrhage volume 3.3 mL. No significant surrounding edema or mass lesion. Differential considerations would include a traumatic hemorrhagic contusion. Venous infarction is possible. Hemorrhagic mass or infarct are consider less likely. Further evaluation with MRI and CTA suggested. Generalized atrophy with diffuse white matter disease most likely chronic. Hypodensity in the right parietal lobe most likely a chronic infarct. These results were called by telephone at the time of interpretation on 06/18/2016 at 3:59 pm to Dr. Zenovia Jarred , who verbally acknowledged these results. Electronically Signed   By: Franchot Gallo M.D.   On: 06/18/2016 16:00   Ct Cervical Spine Wo Contrast  Result Date: 06/18/2016 CLINICAL DATA:  Combative with seizure-like activity at work today. EXAM: CT CERVICAL SPINE WITHOUT CONTRAST TECHNIQUE: Multidetector CT imaging of the cervical spine was performed without intravenous contrast. Multiplanar CT image reconstructions were also generated. COMPARISON:  None. FINDINGS: Despite efforts by the technologist and patient, mild motion artifact is present on today's exam and could not be eliminated. This reduces exam sensitivity and specificity. Alignment: Straightening without focal angulation or listhesis. Vertebrae: No evidence of acute fracture or traumatic subluxation. No focal osseous lesions are seen. Paraspinal tissues: No paraspinal inflammatory changes or fluid collections are demonstrated. There is carotid atherosclerosis bilaterally. Disc levels: There is multilevel spondylosis with disc space narrowing,  uncinate spurring and facet hypertrophy. These findings contribute to mild to moderate spinal stenosis at C5-6 and C6-7. In addition, there is moderate osseous foraminal narrowing bilaterally from C3-4 through C5-6. No acute soft disc herniation noted. IMPRESSION: No evidence of acute cervical spine fracture, traumatic subluxation or static signs of instability. Multilevel spondylosis, contributing to spinal and foraminal narrowing as described. Electronically Signed   By: Richardean Sale M.D.   On: 06/18/2016 16:02    Assessment/Plan: Diagnosis: ?traumatic right temporal hemorrhage 1. Does the need for close, 24 hr/day medical supervision  in concert with the patient's rehab needs make it unreasonable for this patient to be served in a less intensive setting? Yes 2. Co-Morbidities requiring supervision/potential complications: substance abuse 3. Due to bladder management, bowel management, safety, skin/wound care, disease management, medication administration, pain management and patient education, does the patient require 24 hr/day rehab nursing? Yes 4. Does the patient require coordinated care of a physician, rehab nurse, PT (1-2 hrs/day, 5 days/week), OT (1-2 hrs/day, 5 days/week) and SLP (1-2 hrs/day, 5 days/week) to address physical and functional deficits in the context of the above medical diagnosis(es)? Yes Addressing deficits in the following areas: balance, endurance, locomotion, strength, transferring, bowel/bladder control, bathing, dressing, feeding, grooming, toileting and psychosocial support 5. Can the patient actively participate in an intensive therapy program of at least 3 hrs of therapy per day at least 5 days per week? Yes 6. The potential for patient to make measurable gains while on inpatient rehab is excellent 7. Anticipated functional outcomes upon discharge from inpatient rehab are modified independent and supervision  with PT, modified independent and supervision with OT,  modified independent and supervision with SLP. 8. Estimated rehab length of stay to reach the above functional goals is:  10-15 days 9. Does the patient have adequate social supports and living environment to accommodate these discharge functional goals? Yes 10. Anticipated D/C setting: Home 11. Anticipated post D/C treatments: HH therapy and Outpatient therapy 12. Overall Rehab/Functional Prognosis: excellent  RECOMMENDATIONS: This patient's condition is appropriate for continued rehabilitative care in the following setting: CIR Patient has agreed to participate in recommended program. Yes Note that insurance prior authorization may be required for reimbursement for recommended care.  Comment: Rehab Admissions Coordinator to follow up.  Thanks,  Meredith Staggers, MD, Mellody Drown    Cathlyn Parsons., PA-C 06/19/2016

## 2016-06-20 ENCOUNTER — Inpatient Hospital Stay (HOSPITAL_COMMUNITY): Payer: Medicare HMO

## 2016-06-20 DIAGNOSIS — Z720 Tobacco use: Secondary | ICD-10-CM | POA: Diagnosis present

## 2016-06-20 DIAGNOSIS — F10931 Alcohol use, unspecified with withdrawal delirium: Secondary | ICD-10-CM | POA: Diagnosis present

## 2016-06-20 DIAGNOSIS — F10231 Alcohol dependence with withdrawal delirium: Secondary | ICD-10-CM | POA: Diagnosis present

## 2016-06-20 DIAGNOSIS — I6789 Other cerebrovascular disease: Secondary | ICD-10-CM

## 2016-06-20 DIAGNOSIS — G40909 Epilepsy, unspecified, not intractable, without status epilepticus: Secondary | ICD-10-CM

## 2016-06-20 LAB — VAS US CAROTID
LCCADSYS: 65 cm/s
LCCAPDIAS: 14 cm/s
LEFT ECA DIAS: -17 cm/s
LEFT VERTEBRAL DIAS: 16 cm/s
LICAPDIAS: -80 cm/s
LICAPSYS: -285 cm/s
Left CCA dist dias: 17 cm/s
Left CCA prox sys: 83 cm/s
Left ICA dist dias: -22 cm/s
Left ICA dist sys: -79 cm/s
RCCAPDIAS: 15 cm/s
RIGHT ECA DIAS: -21 cm/s
RIGHT VERTEBRAL DIAS: -25 cm/s
Right CCA prox sys: 75 cm/s
Right cca dist sys: -52 cm/s

## 2016-06-20 LAB — HIV ANTIBODY (ROUTINE TESTING W REFLEX): HIV SCREEN 4TH GENERATION: NONREACTIVE

## 2016-06-20 LAB — CBC
HCT: 41.5 % (ref 39.0–52.0)
HEMOGLOBIN: 13.6 g/dL (ref 13.0–17.0)
MCH: 31.4 pg (ref 26.0–34.0)
MCHC: 32.8 g/dL (ref 30.0–36.0)
MCV: 95.8 fL (ref 78.0–100.0)
Platelets: 169 10*3/uL (ref 150–400)
RBC: 4.33 MIL/uL (ref 4.22–5.81)
RDW: 13.3 % (ref 11.5–15.5)
WBC: 8 10*3/uL (ref 4.0–10.5)

## 2016-06-20 LAB — BASIC METABOLIC PANEL
Anion gap: 9 (ref 5–15)
BUN: 22 mg/dL — AB (ref 6–20)
CHLORIDE: 107 mmol/L (ref 101–111)
CO2: 24 mmol/L (ref 22–32)
CREATININE: 1.82 mg/dL — AB (ref 0.61–1.24)
Calcium: 8.7 mg/dL — ABNORMAL LOW (ref 8.9–10.3)
GFR calc Af Amer: 43 mL/min — ABNORMAL LOW (ref >60)
GFR calc non Af Amer: 37 mL/min — ABNORMAL LOW (ref >60)
GLUCOSE: 101 mg/dL — AB (ref 65–99)
POTASSIUM: 3.4 mmol/L — AB (ref 3.5–5.1)
SODIUM: 140 mmol/L (ref 135–145)

## 2016-06-20 LAB — ECHOCARDIOGRAM COMPLETE
Height: 70 in
Weight: 3174.62 oz

## 2016-06-20 LAB — HEMOGLOBIN A1C
Hgb A1c MFr Bld: 5.6 % (ref 4.8–5.6)
Mean Plasma Glucose: 114 mg/dL

## 2016-06-20 LAB — RPR: RPR Ser Ql: NONREACTIVE

## 2016-06-20 MED ORDER — POTASSIUM CHLORIDE 20 MEQ/15ML (10%) PO SOLN
40.0000 meq | Freq: Two times a day (BID) | ORAL | Status: AC
  Administered 2016-06-20 (×2): 40 meq via ORAL
  Filled 2016-06-20 (×2): qty 30

## 2016-06-20 MED ORDER — LORAZEPAM 1 MG PO TABS
1.0000 mg | ORAL_TABLET | ORAL | Status: DC | PRN
Start: 2016-06-20 — End: 2016-06-28
  Administered 2016-06-21: 1 mg via ORAL
  Filled 2016-06-20: qty 1

## 2016-06-20 MED ORDER — HYDRALAZINE HCL 20 MG/ML IJ SOLN
10.0000 mg | INTRAMUSCULAR | Status: DC | PRN
  Administered 2016-06-20 – 2016-06-24 (×13): 10 mg via INTRAVENOUS
  Filled 2016-06-20 (×14): qty 1

## 2016-06-20 MED ORDER — LORAZEPAM 2 MG/ML IJ SOLN
1.0000 mg | INTRAMUSCULAR | Status: DC | PRN
  Administered 2016-06-20 – 2016-06-22 (×5): 1 mg via INTRAVENOUS
  Administered 2016-06-22: 2 mg via INTRAVENOUS
  Administered 2016-06-22 – 2016-06-24 (×2): 1 mg via INTRAVENOUS
  Filled 2016-06-20 (×9): qty 1

## 2016-06-20 MED ORDER — PANTOPRAZOLE SODIUM 40 MG PO TBEC
40.0000 mg | DELAYED_RELEASE_TABLET | Freq: Every day | ORAL | Status: DC
  Administered 2016-06-20 – 2016-06-21 (×2): 40 mg via ORAL
  Filled 2016-06-20 (×4): qty 1

## 2016-06-20 NOTE — Progress Notes (Signed)
EEG attempted, pt resistent, moving head, upper body, then began spitting.  Aborted at this point until another time. Xu notified.

## 2016-06-20 NOTE — Progress Notes (Signed)
STROKE TEAM PROGRESS NOTE   SUBJECTIVE (INTERVAL HISTORY) Patient was seen and examined this morning. He denies headache, lightheadedness. Yesterday afternoon, patient became very agitated likely due to alcohol withdrawal and was started on precedex and required haldol. Precedex was stopped this morning due to bradycardia in the 40-50s.    OBJECTIVE  Recent Labs Lab 06/18/16 1522  GLUCAP 173*    Recent Labs Lab 06/18/16 1530 06/19/16 1225 06/20/16 0631  NA 140 140 140  K 3.7 3.5 3.4*  CL 106 106 107  CO2 19* 22 24  GLUCOSE 188* 120* 101*  BUN 15 17 22*  CREATININE 1.56* 1.62* 1.82*  CALCIUM 8.8* 9.1 8.7*    Recent Labs Lab 06/18/16 1530  AST 27  ALT 18  ALKPHOS 69  BILITOT 1.3*  PROT 7.4  ALBUMIN 4.2    Recent Labs Lab 06/18/16 1530 06/19/16 1225 06/20/16 0631  WBC 11.5* 9.8 8.0  HGB 15.7 14.9 13.6  HCT 46.3 44.6 41.5  MCV 94.1 94.3 95.8  PLT 211 204 169    Recent Labs  06/18/16 1530  LABPROT 13.9  INR 1.07    Recent Labs  06/18/16 1743  COLORURINE YELLOW  LABSPEC 1.011  PHURINE 6.0  GLUCOSEU 50*  HGBUR SMALL*  BILIRUBINUR NEGATIVE  KETONESUR NEGATIVE  PROTEINUR 100*  NITRITE NEGATIVE  LEUKOCYTESUR NEGATIVE        Component Value Date/Time   LABOPIA NONE DETECTED 06/18/2016 1743   COCAINSCRNUR NONE DETECTED 06/18/2016 1743   LABBENZ POSITIVE (A) 06/18/2016 1743   AMPHETMU NONE DETECTED 06/18/2016 1743   THCU NONE DETECTED 06/18/2016 1743   LABBARB NONE DETECTED 06/18/2016 1743     Recent Labs Lab 06/18/16 1530  ETH <5    I have personally reviewed the radiological images below and agree with the radiology interpretations.  Ct Angio Head W Or Wo Contrast  Result Date: 06/18/2016 CLINICAL DATA:  Initial evaluation for acute intracranial hemorrhage. EXAM: CT ANGIOGRAPHY HEAD TECHNIQUE: Multidetector CT imaging of the head was performed using the standard protocol during bolus administration of intravenous contrast. Multiplanar  CT image reconstructions and MIPs were obtained to evaluate the vascular anatomy. CONTRAST:  50 cc of Isovue 370. COMPARISON:  Prior CT from earlier the same day. FINDINGS: CTA HEAD Anterior circulation: Distal cervical segments of the internal carotid arteries are well opacified and widely patent. Petrous segments widely patent bilaterally. Scattered calcified atheromatous plaque throughout the cavernous/ supraclinoid ICAs. Resultant moderate to advanced multifocal narrowing (approximately 50-75%), slightly worse to the left. Note made of a focal moderate stenosis of approximately 50% within the proximal cavernous left ICA (series 502, image 46). The ICA termini widely patent bilaterally. A1 segments widely patent. Anterior communicating artery normal. Anterior cerebral arteries patent to their distal aspects. M1 segments patent without stenosis or occlusion. No proximal M2 occlusion. Distal MCA branches well opacified and symmetric. No vascular abnormality seen underlying the peripheral right temporal lobe hemorrhage. Posterior circulation: Visualized vertebral arteries widely patent to the vertebrobasilar junction. Left PICA patent proximally. Right PICA not well visualized. Basilar artery widely patent to its distal aspect. Superior cerebral arteries patent bilaterally. Both of the posterior cerebral arteries primarily supplied via the basilar and are widely patent to their distal aspects. Venous sinuses: Not well evaluated on this exam due to timing of the contrast bolus. No obvious venous sinus thrombosis. Anatomic variants: No significant anatomic variant. No aneurysm or vascular malformation. No abnormality seen underlying the right temporal hemorrhage. Delayed phase: No pathologic enhancement. Previously identified  hemorrhage within the right temporal lobe is relatively stable measuring 2.6 x 1.7 x 1.8 cm. IMPRESSION: 1. Negative CTA of the brain. No acute vascular abnormality identified. No AVM or other  vascular abnormality seen underlying the right temporal lobe hemorrhage. 2. Atheromatous plaque involving the carotid siphons with moderate to advanced multifocal narrowing (approximately 50-75%), left greater than right. 3. No significant interval change in size and appearance of right temporal lobe hemorrhage, measuring 2.6 x 1.7 x 1.8 cm. No significant mass effect. Electronically Signed   By: Jeannine Boga M.D.   On: 06/18/2016 19:14   Ct Head Wo Contrast  Result Date: 06/20/2016 CLINICAL DATA:  66 y/o  M; intracranial hemorrhage for follow-up. EXAM: CT HEAD WITHOUT CONTRAST TECHNIQUE: Contiguous axial images were obtained from the base of the skull through the vertex without intravenous contrast. COMPARISON:  06/19/2015 CT of the head. FINDINGS: Brain: Stable hematoma within the right temporal lobe. No evidence for new large territory infarct, acute intracranial hemorrhage, or focal mass effect. Stable advanced chronic microvascular ischemic changes of white matter, chronic right parietal infarct, multiple small chronic lacunar infarcts within the basal ganglia, and brain parenchymal volume loss. No hydrocephalus. No effacement of basilar cisterns. Vascular: Severe calcific atherosclerosis of cavernous and paraclinoid internal carotid arteries. Skull: Mildly displaced acute fracture of the nasal bones. No calvarial fracture. Sinuses/Orbits: Chronic left lamina papyracea fracture with mild medial displacement of the medial rectus muscle. Patchy ethmoid sinus mucosal thickening and left maxillary sinus mucous retention cyst. Periapical cysts of multiple maxillary teeth. Other: None. IMPRESSION: 1. Stable right anterolateral temporal lobe hematoma. 2. Stable advanced chronic microvascular ischemic changes, chronic infarctions, and volume loss of the brain. 3. Stable mildly displaced acute nasal bone fractures. 4. No new acute infarct or hemorrhage identified. Electronically Signed   By: Kristine Garbe M.D.   On: 06/20/2016 06:19   Ct Head Wo Contrast  Result Date: 06/19/2016 CLINICAL DATA:  Follow-up of intracranial hemorrhage. EXAM: CT HEAD WITHOUT CONTRAST TECHNIQUE: Contiguous axial images were obtained from the base of the skull through the vertex without intravenous contrast. COMPARISON:  CT angiogram and CT of the head dated 06/18/2016. FINDINGS: Brain: Stable hematoma within the right temporal lobe. No new acute large territory infarct, intracranial hemorrhage, or focal mass effect identified. Advanced chronic microvascular ischemic changes of white matter, chronic right parietal infarct, multiple small chronic lacunar infarcts within the basal ganglia, and mild brain parenchymal volume loss are stable. Stable ventricle size. Vascular: Extensive calcific atherosclerosis of cavernous and paraclinoid internal carotid arteries. Skull: Mildly displaced acute fracture deformity of the nasal bones with mild displacement. No displaced calvarial fracture. Sinuses/Orbits: Mild diffuse ethmoid sinus mucosal thickening and mucous retention cysts within the left maxillary sinus. Chronic left lamina papyracea fracture with herniation of extra orbital fat and mild medial displacement of the medial rectus muscle. Other: Periapical lucencies of multiple teeth in the maxillary alveolar bone compatible with odontogenic disease. IMPRESSION: 1. Stable acute hemorrhage within the right lateral temporal lobe. This area is atypical for hypertensive hemorrhage and traumatic hemorrhagic contusion or underlying hemorrhagic mass should be considered. 2. Stable advanced chronic microvascular ischemic changes of the brain, chronic infarcts, and volume loss. 3. Acute appearing mildly displaced nasal bone fractures and chronic left lamina papyracea fracture are stable. Electronically Signed   By: Kristine Garbe M.D.   On: 06/19/2016 00:35   Ct Head Wo Contrast  Result Date: 06/18/2016 CLINICAL DATA:   Altered mental status.  Seizure activity. EXAM: CT  HEAD WITHOUT CONTRAST TECHNIQUE: Contiguous axial images were obtained from the base of the skull through the vertex without intravenous contrast. COMPARISON:  None. FINDINGS: Brain: Acute hemorrhage in the right lateral temporal lobe measures 27 x 19 x 13 mm. Hemorrhage volume 3.3 mL. No significant surrounding edema or mass. Mild atrophy without hydrocephalus. Hypodensity throughout the cerebral white matter bilaterally most likely chronic microvascular ischemia. Hypodensity in the right parietal lobe involving white matter and cortex. This is most compatible with infarction, probably old but difficult to state with certainty. No other areas of hemorrhage. No shift of the midline structures. No subarachnoid hemorrhage or subdural hemorrhage. Vascular: Arterial calcification in the cavernous carotid bilaterally. Negative for hyperdense vessel. Skull: Negative for skull fracture. No significant scalp soft tissue swelling. Chronic fracture left medial orbit. Sinuses/Orbits: Mucosal edema in the paranasal sinuses. Other: None IMPRESSION: Small area of acute hemorrhage in the right lateral temporal lobe. Hemorrhage volume 3.3 mL. No significant surrounding edema or mass lesion. Differential considerations would include a traumatic hemorrhagic contusion. Venous infarction is possible. Hemorrhagic mass or infarct are consider less likely. Further evaluation with MRI and CTA suggested. Generalized atrophy with diffuse white matter disease most likely chronic. Hypodensity in the right parietal lobe most likely a chronic infarct. These results were called by telephone at the time of interpretation on 06/18/2016 at 3:59 pm to Dr. Zenovia Jarred , who verbally acknowledged these results. Electronically Signed   By: Franchot Gallo M.D.   On: 06/18/2016 16:00   Ct Cervical Spine Wo Contrast  Result Date: 06/18/2016 CLINICAL DATA:  Combative with seizure-like activity at  work today. EXAM: CT CERVICAL SPINE WITHOUT CONTRAST TECHNIQUE: Multidetector CT imaging of the cervical spine was performed without intravenous contrast. Multiplanar CT image reconstructions were also generated. COMPARISON:  None. FINDINGS: Despite efforts by the technologist and patient, mild motion artifact is present on today's exam and could not be eliminated. This reduces exam sensitivity and specificity. Alignment: Straightening without focal angulation or listhesis. Vertebrae: No evidence of acute fracture or traumatic subluxation. No focal osseous lesions are seen. Paraspinal tissues: No paraspinal inflammatory changes or fluid collections are demonstrated. There is carotid atherosclerosis bilaterally. Disc levels: There is multilevel spondylosis with disc space narrowing, uncinate spurring and facet hypertrophy. These findings contribute to mild to moderate spinal stenosis at C5-6 and C6-7. In addition, there is moderate osseous foraminal narrowing bilaterally from C3-4 through C5-6. No acute soft disc herniation noted. IMPRESSION: No evidence of acute cervical spine fracture, traumatic subluxation or static signs of instability. Multilevel spondylosis, contributing to spinal and foraminal narrowing as described. Electronically Signed   By: Richardean Sale M.D.   On: 06/18/2016 16:02   Carotid Doppler: Preliminary findings: Consistent with a high end 60 - 79 percent stenosis involving the proximal left internal carotid artery. Findings consistent with a high end 1- 39 percent stenosis involving the proximal right internal carotid artery. Intimal thickening vs soft plaque bilaterally.  Bilateral vertebral arteries are patent and antegrade.  2D Echocardiogram  pending  PHYSICAL EXAM Vitals:   06/20/16 0700 06/20/16 0800 06/20/16 0900 06/20/16 1000  BP: (!) 143/80 (!) 141/73 140/78 (!) 142/82  Pulse: (!) 50 (!) 59 (!) 55 (!) 52  Resp: 18 (!) 21 20 20   Temp:  97.8 F (36.6 C)    TempSrc:   Axillary    SpO2: 99% 92% 96% 97%  Weight:      Height:       General: Vital signs reviewed.  Patient  is well-developed and well-nourished, in no acute distress and uncooperative with exam.  Cardiovascular: Bradycardic, regular rhythm, no murmurs, gallops, or rubs. Pulmonary/Chest: Clear to auscultation bilaterally, no wheezes, rales, or rhonchi. Abdominal: Soft, non-tender, non-distended, BS + Neuro: somnolent, agitated, not following commands, but moving all 4 extremities on own   ASSESSMENT/PLAN Gabriel Romero is a 66 y.o. male with history of hypertension, alcohol abuse admitted fseizure and found to have an intracranial temporal lobe hemorrhage.   Right Temporal lobe intraparenchymal hematoma: Unclear if the hematoma is secondary to fall, tumor, aneurysm or AVM.   CT head: Small area of acute hemorrhage in the right lateral temporal lobe. Hemorrhage volume 3.3 mL. No significant surrounding edema or mass lesion. Hypodensity in the right parietal lobe most likely a chronic infarct.  CTA Brain: Negative CTA of the brain. No AVM or other vascular abnormality seen underlying the right temporal lobe hemorrhage. Atheromatous plaque involving the carotid siphons with moderate to advanced multifocal narrowing (approximately 50-75%), left greater than right.   Repeat CT Head 3/30: Stable right anterolateral temporal lobe hematoma. Stable advanced chronic microvascular ischemic changes, chronic infarctions, and volume loss of the brain.  MRI pending   Carotid Doppler: Preliminary findings: Consistent with a high end 60 - 79 percent stenosis involving the proximal left internal carotid artery. Findings consistent with a high end 1- 39 percent stenosis involving the proximal right internal carotid artery. Intimal thickening vs soft plaque bilaterally.  Bilateral vertebral arteries are patent and antegrade.  2D Echo pending  Carotid Dopplers pending  LDL 123  HgbA1c 5.6  SCDs for VTE  prophylaxis given ICH  Diet Heart Room service appropriate? Yes; Fluid consistency: Thin   No antithrombotic prior to admission, now on No antithrombotic  Ongoing aggressive stroke risk factor management  Therapy recommendations:  CIR  Disposition:  Pending  Seizure:  On Keppra 500 mg BID  EEG pending  No history of seizures or recurrent seizures   Alcohol level on admission <5, UDS positive for benzos, UA negative, TSH normal, HIV and RPR non reactive  Alcohol Withdrawal  Patient became very agitated yesterday requiring haldol and precedex gtt  Heavy alcohol use at home, today patient agitated and uncooperative  precedex held due to bradycardia  Continuing CIWA  Diabetes  HgbA1c 5.6 goal < 7.0  CBG monitoring  Hypertension  Home meds: None  history of uncontrolled HTN, but not taking any medications at home Maintain BP [id number] systolic-Cleviprex prn  Currently on no antihypertensive therapy given low-normal BP  Stable  Hyperlipidemia  Home meds: None  Currently on atorvastatin 20 mg QD  LDL 123, goal < 70  Continue statin at discharge  Other Stroke Risk Factors  Cigarette smoker, advised to stop smoking  ETOH use  Obesity, Body mass index is 28.47 kg/m.   Other Active Problems  CKD III:  Creatinine 1.5-1.8  Alcohol Abuse: Drinks 8 beers per day. On CIWA, Precedex   Tobacco abuse- Nicotine patch  Hospital day # [place], DO PGY-3 Internal Medicine Resident Pager # [phone] 06/20/2016 10:31 AM  To contact Stroke Continuity provider, please refer to [email or web address] After hours, contact General Neurology

## 2016-06-20 NOTE — Progress Notes (Signed)
Head CT stable.  No new neurosurgical issues at present.  Patient having EEG performed at present.

## 2016-06-20 NOTE — Progress Notes (Signed)
OT Cancellation Note  Patient Details Name: Gabriel Romero MRN: [id number] DOB: 02-01-1951   Cancelled Treatment:    Reason Eval/Treat Not Completed: Medical issues which prohibited therapy - Pt currently sedated and in restraints.  Will reattempt.  [facility], OTR/L [phone]   [name] 06/20/2016, 10:08 AM

## 2016-06-20 NOTE — Progress Notes (Signed)
Patient is spitting at EEG tech. MD paged and Ativan order changed. EEG incomplete. Family updated and at bedside. Alajia Schmelzer, Rande Brunt, RN

## 2016-06-20 NOTE — Progress Notes (Signed)
  Echocardiogram 2D Echocardiogram has been performed.  Darlina Sicilian M 06/20/2016, 10:37 AM

## 2016-06-20 NOTE — Progress Notes (Signed)
Inpatient Rehabilitation  Met with patient's son at bedside to discuss team's recommendation for IP Rehab.  Shared booklets and answered initial questions.  They plan to look at booklets and discuss options as a family.  Plan for one of my co-workers to follow-up Monday.  Will follow for timing of medical readiness, insurance authorization, and bed availability.    Carmelia Roller., CCC/SLP Admission Coordinator  [facility]  Cell [phone]

## 2016-06-20 NOTE — Progress Notes (Signed)
PT Cancellation Note  Patient Details Name: LAVERE STORK MRN: [id number] DOB: 1951-02-17   Cancelled Treatment:    Reason Eval/Treat Not Completed: Other (comment) Holding PT at this time as pt agitated and restrained. Going through DTs. Pt spitting at EEG tech earlier in PM. Will follow up as appropriate.   Marguarite Arbour A Xaidyn Kepner 06/20/2016, 1:31 PM Wray Kearns, [place], DPT [phone]

## 2016-06-21 ENCOUNTER — Inpatient Hospital Stay (HOSPITAL_COMMUNITY): Payer: Medicare HMO

## 2016-06-21 DIAGNOSIS — R569 Unspecified convulsions: Secondary | ICD-10-CM

## 2016-06-21 LAB — CBC
HCT: 42.8 % (ref 39.0–52.0)
Hemoglobin: 14.1 g/dL (ref 13.0–17.0)
MCH: 31.7 pg (ref 26.0–34.0)
MCHC: 32.9 g/dL (ref 30.0–36.0)
MCV: 96.2 fL (ref 78.0–100.0)
Platelets: 172 10*3/uL (ref 150–400)
RBC: 4.45 MIL/uL (ref 4.22–5.81)
RDW: 13.3 % (ref 11.5–15.5)
WBC: 10.2 10*3/uL (ref 4.0–10.5)

## 2016-06-21 LAB — BASIC METABOLIC PANEL WITH GFR
Anion gap: 8 (ref 5–15)
BUN: 23 mg/dL — ABNORMAL HIGH (ref 6–20)
CO2: 22 mmol/L (ref 22–32)
Calcium: 8.6 mg/dL — ABNORMAL LOW (ref 8.9–10.3)
Chloride: 113 mmol/L — ABNORMAL HIGH (ref 101–111)
Creatinine, Ser: 1.59 mg/dL — ABNORMAL HIGH (ref 0.61–1.24)
GFR calc Af Amer: 51 mL/min — ABNORMAL LOW
GFR calc non Af Amer: 44 mL/min — ABNORMAL LOW
Glucose, Bld: 97 mg/dL (ref 65–99)
Potassium: 3.5 mmol/L (ref 3.5–5.1)
Sodium: 143 mmol/L (ref 135–145)

## 2016-06-21 MED ORDER — HYDROCODONE-ACETAMINOPHEN 5-325 MG PO TABS
1.0000 | ORAL_TABLET | Freq: Four times a day (QID) | ORAL | Status: DC | PRN
  Administered 2016-06-21 (×2): 1 via ORAL
  Filled 2016-06-21 (×2): qty 1

## 2016-06-21 MED ORDER — PANTOPRAZOLE SODIUM 40 MG PO TBEC: 40.0000 mg | DELAYED_RELEASE_TABLET | Freq: Every day | ORAL | Status: DC

## 2016-06-21 NOTE — Procedures (Signed)
History: 66 year old male with ICH  Sedation: None  Technique: This is a 21 channel routine scalp EEG performed at the bedside with bipolar and monopolar montages arranged in accordance to the international 10/20 system of electrode placement. One channel was dedicated to EKG recording.    Background: The background consists predominantly of intermixed alpha and beta activities. There is a well defined posterior dominant rhythm of 10 Hz that attenuates with eye opening. There are brief runs of frontally predominant intermittent rhythmic delta activity(FIRDA).  Photic stimulation: Physiologic driving is not performed  EEG Abnormalities: 1) FIRDA  Clinical Interpretation: This EEG is suggestive of a mild generalized non-specific cerebral dysfunction(encephalopathy). There was no seizure or seizure predisposition recorded on this study. Please note that a normal EEG does not preclude the possibility of epilepsy.   Roland Rack, MD [facility] [phone]  If 7pm- 7am, please page neurology on call as listed in [place].

## 2016-06-21 NOTE — Progress Notes (Signed)
Came to [id number] from [facility] ICU. VSS. Getting oriented to room, many family members around.

## 2016-06-21 NOTE — Progress Notes (Signed)
EEG Completed; Results Pending  

## 2016-06-21 NOTE — Progress Notes (Signed)
STROKE TEAM PROGRESS NOTE   SUBJECTIVE (INTERVAL HISTORY) Gabriel Romero was seen and examined this morning. He denies headache, lightheadedness. Yesterday afternoon, Gabriel Romero became very agitated likely due to alcohol withdrawal and was started on precedex and required haldol. Precedex was stopped this morning due to bradycardia in the 40-50s.    OBJECTIVE  Recent Labs Lab 06/18/16 1522  GLUCAP 173*    Recent Labs Lab 06/18/16 1530 06/19/16 1225 06/20/16 0631 06/21/16 0322  NA 140 140 140 143  K 3.7 3.5 3.4* 3.5  CL 106 106 107 113*  CO2 19* 22 24 22   GLUCOSE 188* 120* 101* 97  BUN 15 17 22* 23*  CREATININE 1.56* 1.62* 1.82* 1.59*  CALCIUM 8.8* 9.1 8.7* 8.6*    Recent Labs Lab 06/18/16 1530  AST 27  ALT 18  ALKPHOS 69  BILITOT 1.3*  PROT 7.4  ALBUMIN 4.2    Recent Labs Lab 06/18/16 1530 06/19/16 1225 06/20/16 0631 06/21/16 0322  WBC 11.5* 9.8 8.0 10.2  HGB 15.7 14.9 13.6 14.1  HCT 46.3 44.6 41.5 42.8  MCV 94.1 94.3 95.8 96.2  PLT 211 204 169 172    Recent Labs  06/18/16 1530  LABPROT 13.9  INR 1.07    Recent Labs  06/18/16 1743  COLORURINE YELLOW  LABSPEC 1.011  PHURINE 6.0  GLUCOSEU 50*  HGBUR SMALL*  BILIRUBINUR NEGATIVE  KETONESUR NEGATIVE  PROTEINUR 100*  NITRITE NEGATIVE  LEUKOCYTESUR NEGATIVE        Component Value Date/Time   LABOPIA NONE DETECTED 06/18/2016 1743   COCAINSCRNUR NONE DETECTED 06/18/2016 1743   LABBENZ POSITIVE (A) 06/18/2016 1743   AMPHETMU NONE DETECTED 06/18/2016 1743   THCU NONE DETECTED 06/18/2016 1743   LABBARB NONE DETECTED 06/18/2016 1743     Recent Labs Lab 06/18/16 1530  ETH <5    I have personally reviewed the radiological images below and agree with the radiology interpretations.  Ct Angio Head W Or Wo Contrast  Result Date: 06/18/2016 CLINICAL DATA:  Initial evaluation for acute intracranial hemorrhage. EXAM: CT ANGIOGRAPHY HEAD TECHNIQUE: Multidetector CT imaging of the head was performed  using the standard protocol during bolus administration of intravenous contrast. Multiplanar CT image reconstructions and MIPs were obtained to evaluate the vascular anatomy. CONTRAST:  50 cc of Isovue 370. COMPARISON:  Prior CT from earlier the same day. FINDINGS: CTA HEAD Anterior circulation: Distal cervical segments of the internal carotid arteries are well opacified and widely patent. Petrous segments widely patent bilaterally. Scattered calcified atheromatous plaque throughout the cavernous/ supraclinoid ICAs. Resultant moderate to advanced multifocal narrowing (approximately 50-75%), slightly worse to the left. Note made of a focal moderate stenosis of approximately 50% within the proximal cavernous left ICA (series 502, image 46). The ICA termini widely patent bilaterally. A1 segments widely patent. Anterior communicating artery normal. Anterior cerebral arteries patent to their distal aspects. M1 segments patent without stenosis or occlusion. No proximal M2 occlusion. Distal MCA branches well opacified and symmetric. No vascular abnormality seen underlying the peripheral right temporal lobe hemorrhage. Posterior circulation: Visualized vertebral arteries widely patent to the vertebrobasilar junction. Left PICA patent proximally. Right PICA not well visualized. Basilar artery widely patent to its distal aspect. Superior cerebral arteries patent bilaterally. Both of the posterior cerebral arteries primarily supplied via the basilar and are widely patent to their distal aspects. Venous sinuses: Not well evaluated on this exam due to timing of the contrast bolus. No obvious venous sinus thrombosis. Anatomic variants: No significant anatomic variant. No aneurysm or  vascular malformation. No abnormality seen underlying the right temporal hemorrhage. Delayed phase: No pathologic enhancement. Previously identified hemorrhage within the right temporal lobe is relatively stable measuring 2.6 x 1.7 x 1.8 cm.  IMPRESSION: 1. Negative CTA of the brain. No acute vascular abnormality identified. No AVM or other vascular abnormality seen underlying the right temporal lobe hemorrhage. 2. Atheromatous plaque involving the carotid siphons with moderate to advanced multifocal narrowing (approximately 50-75%), left greater than right. 3. No significant interval change in size and appearance of right temporal lobe hemorrhage, measuring 2.6 x 1.7 x 1.8 cm. No significant mass effect. Electronically Signed   By: Jeannine Boga M.D.   On: 06/18/2016 19:14   Ct Head Wo Contrast  Result Date: 06/20/2016 CLINICAL DATA:  66 y/o  M; intracranial hemorrhage for follow-up. EXAM: CT HEAD WITHOUT CONTRAST TECHNIQUE: Contiguous axial images were obtained from the base of the skull through the vertex without intravenous contrast. COMPARISON:  06/19/2015 CT of the head. FINDINGS: Brain: Stable hematoma within the right temporal lobe. No evidence for new large territory infarct, acute intracranial hemorrhage, or focal mass effect. Stable advanced chronic microvascular ischemic changes of white matter, chronic right parietal infarct, multiple small chronic lacunar infarcts within the basal ganglia, and brain parenchymal volume loss. No hydrocephalus. No effacement of basilar cisterns. Vascular: Severe calcific atherosclerosis of cavernous and paraclinoid internal carotid arteries. Skull: Mildly displaced acute fracture of the nasal bones. No calvarial fracture. Sinuses/Orbits: Chronic left lamina papyracea fracture with mild medial displacement of the medial rectus muscle. Patchy ethmoid sinus mucosal thickening and left maxillary sinus mucous retention cyst. Periapical cysts of multiple maxillary teeth. Other: None. IMPRESSION: 1. Stable right anterolateral temporal lobe hematoma. 2. Stable advanced chronic microvascular ischemic changes, chronic infarctions, and volume loss of the brain. 3. Stable mildly displaced acute nasal bone  fractures. 4. No new acute infarct or hemorrhage identified. Electronically Signed   By: Kristine Garbe M.D.   On: 06/20/2016 06:19   Ct Head Wo Contrast  Result Date: 06/19/2016 CLINICAL DATA:  Follow-up of intracranial hemorrhage. EXAM: CT HEAD WITHOUT CONTRAST TECHNIQUE: Contiguous axial images were obtained from the base of the skull through the vertex without intravenous contrast. COMPARISON:  CT angiogram and CT of the head dated 06/18/2016. FINDINGS: Brain: Stable hematoma within the right temporal lobe. No new acute large territory infarct, intracranial hemorrhage, or focal mass effect identified. Advanced chronic microvascular ischemic changes of white matter, chronic right parietal infarct, multiple small chronic lacunar infarcts within the basal ganglia, and mild brain parenchymal volume loss are stable. Stable ventricle size. Vascular: Extensive calcific atherosclerosis of cavernous and paraclinoid internal carotid arteries. Skull: Mildly displaced acute fracture deformity of the nasal bones with mild displacement. No displaced calvarial fracture. Sinuses/Orbits: Mild diffuse ethmoid sinus mucosal thickening and mucous retention cysts within the left maxillary sinus. Chronic left lamina papyracea fracture with herniation of extra orbital fat and mild medial displacement of the medial rectus muscle. Other: Periapical lucencies of multiple teeth in the maxillary alveolar bone compatible with odontogenic disease. IMPRESSION: 1. Stable acute hemorrhage within the right lateral temporal lobe. This area is atypical for hypertensive hemorrhage and traumatic hemorrhagic contusion or underlying hemorrhagic mass should be considered. 2. Stable advanced chronic microvascular ischemic changes of the brain, chronic infarcts, and volume loss. 3. Acute appearing mildly displaced nasal bone fractures and chronic left lamina papyracea fracture are stable. Electronically Signed   By: Kristine Garbe  M.D.   On: 06/19/2016 00:35   Ct Head  Wo Contrast  Result Date: 06/18/2016 CLINICAL DATA:  Altered mental status.  Seizure activity. EXAM: CT HEAD WITHOUT CONTRAST TECHNIQUE: Contiguous axial images were obtained from the base of the skull through the vertex without intravenous contrast. COMPARISON:  None. FINDINGS: Brain: Acute hemorrhage in the right lateral temporal lobe measures 27 x 19 x 13 mm. Hemorrhage volume 3.3 mL. No significant surrounding edema or mass. Mild atrophy without hydrocephalus. Hypodensity throughout the cerebral white matter bilaterally most likely chronic microvascular ischemia. Hypodensity in the right parietal lobe involving white matter and cortex. This is most compatible with infarction, probably old but difficult to state with certainty. No other areas of hemorrhage. No shift of the midline structures. No subarachnoid hemorrhage or subdural hemorrhage. Vascular: Arterial calcification in the cavernous carotid bilaterally. Negative for hyperdense vessel. Skull: Negative for skull fracture. No significant scalp soft tissue swelling. Chronic fracture left medial orbit. Sinuses/Orbits: Mucosal edema in the paranasal sinuses. Other: None IMPRESSION: Small area of acute hemorrhage in the right lateral temporal lobe. Hemorrhage volume 3.3 mL. No significant surrounding edema or mass lesion. Differential considerations would include a traumatic hemorrhagic contusion. Venous infarction is possible. Hemorrhagic mass or infarct are consider less likely. Further evaluation with MRI and CTA suggested. Generalized atrophy with diffuse white matter disease most likely chronic. Hypodensity in the right parietal lobe most likely a chronic infarct. These results were called by telephone at the time of interpretation on 06/18/2016 at 3:59 pm to Dr. Zenovia Jarred , who verbally acknowledged these results. Electronically Signed   By: Franchot Gallo M.D.   On: 06/18/2016 16:00   Ct Cervical Spine  Wo Contrast  Result Date: 06/18/2016 CLINICAL DATA:  Combative with seizure-like activity at work today. EXAM: CT CERVICAL SPINE WITHOUT CONTRAST TECHNIQUE: Multidetector CT imaging of the cervical spine was performed without intravenous contrast. Multiplanar CT image reconstructions were also generated. COMPARISON:  None. FINDINGS: Despite efforts by the technologist and Gabriel Romero, mild motion artifact is present on today's exam and could not be eliminated. This reduces exam sensitivity and specificity. Alignment: Straightening without focal angulation or listhesis. Vertebrae: No evidence of acute fracture or traumatic subluxation. No focal osseous lesions are seen. Paraspinal tissues: No paraspinal inflammatory changes or fluid collections are demonstrated. There is carotid atherosclerosis bilaterally. Disc levels: There is multilevel spondylosis with disc space narrowing, uncinate spurring and facet hypertrophy. These findings contribute to mild to moderate spinal stenosis at C5-6 and C6-7. In addition, there is moderate osseous foraminal narrowing bilaterally from C3-4 through C5-6. No acute soft disc herniation noted. IMPRESSION: No evidence of acute cervical spine fracture, traumatic subluxation or static signs of instability. Multilevel spondylosis, contributing to spinal and foraminal narrowing as described. Electronically Signed   By: Richardean Sale M.D.   On: 06/18/2016 16:02   Carotid Doppler: Preliminary findings: Consistent with a high end 60 - 79 percent stenosis involving the proximal left internal carotid artery. Findings consistent with a high end 1- 39 percent stenosis involving the proximal right internal carotid artery. Intimal thickening vs soft plaque bilaterally.  Bilateral vertebral arteries are patent and antegrade.  2D Echocardiogram  pending  PHYSICAL EXAM Vitals:   06/21/16 1200 06/21/16 1300 06/21/16 1400 06/21/16 1500  BP: (!) 157/75  136/66 (!) 158/77  Pulse: 87 83 88 87   Resp: (!) 21 (!) 23 (!) 24 (!) 22  Temp: 97.7 F (36.5 C)     TempSrc: Oral     SpO2: 90% 92% 95% 97%  Weight:  Height:       General: Vital signs reviewed.  Gabriel Romero is well-developed and well-nourished, in no acute distress and    Cardiovascular: Bradycardic, regular rhythm, no murmurs, gallops, or rubs. Pulmonary/Chest: Clear to auscultation bilaterally, no wheezes, rales, or rhonchi. Abdominal: Soft, non-tender, non-distended, BS + Neurological Exam  :  Awake alert and cooperative. Diminished attention, registration and recall. Normal facial apraxia or dysarthria. Follows two-step commands. Diminished recall. Extraocular movements are pharyngeal or nystagmus. Blinks to threat bilaterally. Fundi were not visualized. Face is symmetric without weakness. Not cooperative for detailed visual field testing but suspect left superior quadrant vision field loss. Face is symmetric. Tongue is midline. Motor system exam no upper or lower extremity drift. Symmetric and equal strength in all 4 extremities. No focal weakness. Deep tendon reflexes are symmetric plantars are downgoing. Gait not tested. ASSESSMENT/PLAN Gabriel Romero is a 66 y.o. male with history of hypertension, alcohol abuse admitted fseizure and found to have an intracranial temporal lobe hemorrhage.   Right Temporal lobe intraparenchymal hematoma: Unclear if the hematoma is secondary to fall, tumor, aneurysm or AVM.   CT head: Small area of acute hemorrhage in the right lateral temporal lobe. Hemorrhage volume 3.3 mL. No significant surrounding edema or mass lesion. Hypodensity in the right parietal lobe most likely a chronic infarct.  CTA Brain: Negative CTA of the brain. No AVM or other vascular abnormality seen underlying the right temporal lobe hemorrhage. Atheromatous plaque involving the carotid siphons with moderate to advanced multifocal narrowing (approximately 50-75%), left greater than right.   Repeat CT Head 3/30:  Stable right anterolateral temporal lobe hematoma. Stable advanced chronic microvascular ischemic changes, chronic infarctions, and volume loss of the brain.  MRI pending   Carotid Doppler: Preliminary findings: Consistent with a high end 60 - 79 percent stenosis involving the proximal left internal carotid artery. Findings consistent with a high end 1- 39 percent stenosis involving the proximal right internal carotid artery. Intimal thickening vs soft plaque bilaterally.  Bilateral vertebral arteries are patent and antegrade.  2D Echo pending  Carotid Dopplers pending  LDL 123  HgbA1c 5.6  SCDs for VTE prophylaxis given ICH  Diet Heart Room service appropriate? Yes; Fluid consistency: Thin   No antithrombotic prior to admission, now on No antithrombotic  Ongoing aggressive stroke risk factor management  Therapy recommendations:  CIR  Disposition:  Pending  Seizure:  On Keppra 500 mg BID  EEG mild gen slowing. No seizures  No history of seizures or recurrent seizures   Alcohol level on admission <5, UDS positive for benzos, UA negative, TSH normal, HIV and RPR non reactive  Alcohol Withdrawal  Gabriel Romero became very agitated yesterday requiring haldol and precedex gtt  Heavy alcohol use at home, today Gabriel Romero agitated and uncooperative  precedex held due to bradycardia  Continuing CIWA  Diabetes  HgbA1c 5.6 goal < 7.0  CBG monitoring  Hypertension  Home meds: None  history of uncontrolled HTN, but not taking any medications at home Maintain BP [phone] systolic-Cleviprex prn  Currently on no antihypertensive therapy given low-normal BP  Stable  Hyperlipidemia  Home meds: None  Currently on atorvastatin 20 mg QD  LDL 123, goal < 70  Continue statin at discharge  Other Stroke Risk Factors  Cigarette smoker, advised to stop smoking  ETOH use  Obesity, Body mass index is 28.47 kg/m.   Other Active Problems  CKD III:  Creatinine  1.5-1.8  Alcohol Abuse: Drinks 8 beers per day. On CIWA,  Precedex   Tobacco abuse- Nicotine patch  Hospital day # 3  I have personally examined this Gabriel Romero, reviewed notes, independently viewed imaging studies, participated in medical decision making and plan of care.ROS completed by me personally and pertinent positives fully documented  I have made any additions or clarifications directly to the above note.  Plan to shin is more cooperative with exam today hence we'll check EEG as well as MRI scan. Transfer to neurology floor bed. Long discussion at bedside with the Gabriel Romero and Gabriel Romero and answered questions. Greater than 50% time during this 35 minute visit was spent on counseling and coordination of care about Gabriel intracerebral hemorrhage, alcohol withdrawal and answering questions  Antony Contras, MD Medical Director [facility] Pager: [phone] 06/21/2016 3:28 PM  06/21/2016 3:28 PM  To contact Stroke Continuity provider, please refer to [email or web address] After hours, contact General Neurology

## 2016-06-22 ENCOUNTER — Inpatient Hospital Stay (HOSPITAL_COMMUNITY): Payer: Medicare HMO

## 2016-06-22 DIAGNOSIS — F10231 Alcohol dependence with withdrawal delirium: Secondary | ICD-10-CM

## 2016-06-22 LAB — BASIC METABOLIC PANEL
ANION GAP: 7 (ref 5–15)
BUN: 16 mg/dL (ref 6–20)
CO2: 20 mmol/L — AB (ref 22–32)
CREATININE: 1.37 mg/dL — AB (ref 0.61–1.24)
Calcium: 8.6 mg/dL — ABNORMAL LOW (ref 8.9–10.3)
Chloride: 115 mmol/L — ABNORMAL HIGH (ref 101–111)
GFR calc Af Amer: >60 mL/min (ref >60)
GFR, EST NON AFRICAN AMERICAN: 52 mL/min — AB (ref >60)
Glucose, Bld: 116 mg/dL — ABNORMAL HIGH (ref 65–99)
Potassium: 3.4 mmol/L — ABNORMAL LOW (ref 3.5–5.1)
SODIUM: 142 mmol/L (ref 135–145)

## 2016-06-22 LAB — CBC
HCT: 40.8 % (ref 39.0–52.0)
Hemoglobin: 13.2 g/dL (ref 13.0–17.0)
MCH: 30.9 pg (ref 26.0–34.0)
MCHC: 32.4 g/dL (ref 30.0–36.0)
MCV: 95.6 fL (ref 78.0–100.0)
PLATELETS: 151 10*3/uL (ref 150–400)
RBC: 4.27 MIL/uL (ref 4.22–5.81)
RDW: 13.6 % (ref 11.5–15.5)
WBC: 9.3 10*3/uL (ref 4.0–10.5)

## 2016-06-22 LAB — PHOSPHORUS: Phosphorus: 3.4 mg/dL (ref 2.5–4.6)

## 2016-06-22 LAB — MAGNESIUM: Magnesium: 1.8 mg/dL (ref 1.7–2.4)

## 2016-06-22 LAB — LACTIC ACID, PLASMA: LACTIC ACID, VENOUS: 0.8 mmol/L (ref 0.5–1.9)

## 2016-06-22 MED ORDER — DIAZEPAM 5 MG PO TABS
5.0000 mg | ORAL_TABLET | Freq: Once | ORAL | Status: AC
  Administered 2016-06-22: 5 mg via ORAL
  Filled 2016-06-22: qty 1

## 2016-06-22 MED ORDER — LORAZEPAM 2 MG/ML IJ SOLN
1.0000 mg | Freq: Four times a day (QID) | INTRAMUSCULAR | Status: DC
  Administered 2016-06-23: 2 mg via INTRAVENOUS
  Administered 2016-06-24: 1 mg via INTRAVENOUS
  Administered 2016-06-24: 2 mg via INTRAVENOUS
  Filled 2016-06-22 (×3): qty 1

## 2016-06-22 MED ORDER — PANTOPRAZOLE SODIUM 40 MG IV SOLR
40.0000 mg | Freq: Every day | INTRAVENOUS | Status: DC
  Administered 2016-06-23: 40 mg via INTRAVENOUS
  Filled 2016-06-22: qty 40

## 2016-06-22 MED ORDER — DEXMEDETOMIDINE HCL IN NACL 200 MCG/50ML IV SOLN
0.4000 ug/kg/h | INTRAVENOUS | Status: DC
  Administered 2016-06-22: 0.5 ug/kg/h via INTRAVENOUS
  Administered 2016-06-22: 0.4 ug/kg/h via INTRAVENOUS
  Administered 2016-06-22 – 2016-06-23 (×2): 0.5 ug/kg/h via INTRAVENOUS
  Administered 2016-06-23: 0.3 ug/kg/h via INTRAVENOUS
  Filled 2016-06-22 (×5): qty 50

## 2016-06-22 MED ORDER — DIAZEPAM 5 MG PO TABS
5.0000 mg | ORAL_TABLET | Freq: Four times a day (QID) | ORAL | Status: DC
  Administered 2016-06-22: 5 mg via ORAL
  Filled 2016-06-22 (×2): qty 1

## 2016-06-22 MED ORDER — LABETALOL HCL 5 MG/ML IV SOLN: 40.0000 mg | Freq: Once | INTRAVENOUS | Status: DC

## 2016-06-22 MED ORDER — LABETALOL HCL 5 MG/ML IV SOLN
INTRAVENOUS | Status: AC
  Filled 2016-06-22: qty 8

## 2016-06-22 MED ORDER — FOLIC ACID 5 MG/ML IJ SOLN
1.0000 mg | Freq: Every day | INTRAMUSCULAR | Status: DC
  Administered 2016-06-23: 1 mg via INTRAVENOUS
  Filled 2016-06-22 (×2): qty 0.2

## 2016-06-22 MED ORDER — SODIUM CHLORIDE 0.9 % IV SOLN
0.4000 ug/kg/h | INTRAVENOUS | Status: DC
  Filled 2016-06-22 (×2): qty 2

## 2016-06-22 MED ORDER — DIAZEPAM 5 MG PO TABS: 10.0000 mg | ORAL_TABLET | Freq: Three times a day (TID) | ORAL | Status: DC

## 2016-06-22 MED ORDER — DIAZEPAM 5 MG PO TABS
ORAL_TABLET | ORAL | Status: AC
  Administered 2016-06-22: 04:00:00
  Filled 2016-06-22: qty 1

## 2016-06-22 NOTE — Progress Notes (Signed)
Patient anxiety, agitation and BP reported to MD. Per [facility] precedex is ICU drug only. 5 mg valium PO one time dose per Dr. Cheral Marker entered. 0.5 Tab valium fell on floor during administration to patient. Witnessed waste with Annie Main, CN, RN. An additional dose removed from pyxis via override. 2.5 mg valium administered to patient PO for a total of 5 mg valium PO administered to patient per order. RN will continue to monitor.

## 2016-06-22 NOTE — Progress Notes (Signed)
OT Cancellation Note  Patient Details Name: Gabriel Romero MRN: [id number] DOB: 03/29/1950   Cancelled Treatment:    Reason Eval/Treat Not Completed: Medical issues which prohibited therapy.  Pt with continued agitation requiring transfer back to ICU.  [name], OTR/L [phone]   [name] 06/22/2016, 1:34 PM

## 2016-06-22 NOTE — Progress Notes (Signed)
Just gave 40mg  IV labetalol per Dr. Clydene Fake order.

## 2016-06-22 NOTE — Assessment & Plan Note (Signed)
Start precedex gtt - ok to accept HR 40-50 if precedex is working (avoid bolus) and no sinus pauses. Sometimes we can aid some neo to help with BP   Ativan prn  Intubate with diprivan if worse

## 2016-06-22 NOTE — Progress Notes (Signed)
[place] Progress Note Patient Name: Gabriel Romero DOB: Feb 04, 1951 MRN: [id number]   Date of Service  06/22/2016  HPI/Events of Note  Can't take meds po  eICU Interventions  Change valium to ativan IV, folate to IV, pantoprazole to IV     Intervention Category Minor Interventions: Routine modifications to care plan (e.g. PRN medications for pain, fever)  Simonne Maffucci 06/22/2016, 11:51 PM

## 2016-06-22 NOTE — Progress Notes (Signed)
Less agitated at present, B/P 169/72. To be moved to ICU to allow IV Precedex.

## 2016-06-22 NOTE — Assessment & Plan Note (Signed)
Per neuro 

## 2016-06-22 NOTE — Progress Notes (Signed)
Just sent text to Dr Leonel Ramsay, pt's b/p 215/97. He's agiatated, giving IV Ativan as ordered.

## 2016-06-22 NOTE — Progress Notes (Addendum)
STROKE TEAM PROGRESS NOTE   SUBJECTIVE (INTERVAL HISTORY) Called by nursing staff to see patient for agitation and elevated blood pressure. The patient had been placed in 5 point restraints. Issues believed secondary to alcohol withdrawal. Dr. Leonie Man contacted critical care for transfer back to the intensive care unit.   OBJECTIVE  Recent Labs Lab 06/18/16 1522  GLUCAP 173*    Recent Labs Lab 06/18/16 1530 06/19/16 1225 06/20/16 0631 06/21/16 0322  NA 140 140 140 143  K 3.7 3.5 3.4* 3.5  CL 106 106 107 113*  CO2 19* 22 24 22   GLUCOSE 188* 120* 101* 97  BUN 15 17 22* 23*  CREATININE 1.56* 1.62* 1.82* 1.59*  CALCIUM 8.8* 9.1 8.7* 8.6*    Recent Labs Lab 06/18/16 1530  AST 27  ALT 18  ALKPHOS 69  BILITOT 1.3*  PROT 7.4  ALBUMIN 4.2    Recent Labs Lab 06/18/16 1530 06/19/16 1225 06/20/16 0631 06/21/16 0322  WBC 11.5* 9.8 8.0 10.2  HGB 15.7 14.9 13.6 14.1  HCT 46.3 44.6 41.5 42.8  MCV 94.1 94.3 95.8 96.2  PLT 211 204 169 172   No results for input(s): LABPROT, INR in the last 72 hours. No results for input(s): COLORURINE, LABSPEC, [place], GLUCOSEU, HGBUR, BILIRUBINUR, KETONESUR, PROTEINUR, UROBILINOGEN, NITRITE, LEUKOCYTESUR in the last 72 hours.  Invalid input(s): APPERANCEUR      Component Value Date/Time   LABOPIA NONE DETECTED 06/18/2016 1743   COCAINSCRNUR NONE DETECTED 06/18/2016 1743   LABBENZ POSITIVE (A) 06/18/2016 1743   AMPHETMU NONE DETECTED 06/18/2016 1743   THCU NONE DETECTED 06/18/2016 1743   LABBARB NONE DETECTED 06/18/2016 1743     Recent Labs Lab 06/18/16 1530  ETH <5    Imaging  Ct Angio Head W Or Wo Contrast 06/18/2016 1. Negative CTA of the brain. No acute vascular abnormality identified. No AVM or other vascular abnormality seen underlying the right temporal lobe hemorrhage.  2. Atheromatous plaque involving the carotid siphons with moderate to advanced multifocal narrowing (approximately 50-75%), left greater than right.   3. No significant interval change in size and appearance of right temporal lobe hemorrhage, measuring 2.6 x 1.7 x 1.8 cm. No significant mass effect.      Ct Head Wo Contrast 06/20/2016 1. Stable right anterolateral temporal lobe hematoma.  2. Stable advanced chronic microvascular ischemic changes, chronic infarctions, and volume loss of the brain.  3. Stable mildly displaced acute nasal bone fractures.  4. No new acute infarct or hemorrhage identified.    Ct Head Wo Contrast 06/19/2016 1. Stable acute hemorrhage within the right lateral temporal lobe. This area is atypical for hypertensive hemorrhage and traumatic hemorrhagic contusion or underlying hemorrhagic mass should be considered.  2. Stable advanced chronic microvascular ischemic changes of the brain, chronic infarcts, and volume loss.  3. Acute appearing mildly displaced nasal bone fractures and chronic left lamina papyracea fracture are stable.     Ct Head Wo Contrast 06/18/2016 Small area of acute hemorrhage in the right lateral temporal lobe. Hemorrhage volume 3.3 mL.  No significant surrounding edema or mass lesion.  Differential considerations would include a traumatic hemorrhagic contusion. Venous infarction is possible.  Hemorrhagic mass or infarct are consider less likely. Further evaluation with MRI and CTA suggested.  Generalized atrophy with diffuse white matter disease most likely chronic.  Hypodensity in the right parietal lobe most likely a chronic infarct.     Ct Cervical Spine Wo Contrast 06/18/2016 No evidence of acute cervical spine fracture, traumatic subluxation  or static signs of instability. Multilevel spondylosis, contributing to spinal and foraminal narrowing as described.    Carotid Doppler: Preliminary findings: Consistent with a high end 60 - 79 percent stenosis involving the proximal left internal carotid artery. Findings consistent with a high end 1- 39 percent stenosis involving the proximal  right internal carotid artery. Intimal thickening vs soft plaque bilaterally.  Bilateral vertebral arteries are patent and antegrade.    PHYSICAL EXAM Vitals:   06/22/16 0600 06/22/16 0619 06/22/16 0650 06/22/16 0706  BP: (!) 183/89 (!) 163/79 (!) 193/91 (!) 186/94  Pulse: 79 74  84  Resp: (!) 21 20    Temp: 97.5 F (36.4 C)     TempSrc: Oral     SpO2: 97% 97%  98%  Weight:      Height:       General: Vital signs reviewed.  Patient is well-developed and well-nourished, in no acute distress and    Cardiovascular: Bradycardic, regular rhythm, no murmurs, gallops, or rubs. Pulmonary/Chest: Clear to auscultation bilaterally, no wheezes, rales, or rhonchi. Abdominal: Soft, non-tender, non-distended, BS + Neurological Exam  :  Drowsy but opens eyes and follows commands. Diminished attention, registration and recall. Normal facial apraxia or dysarthria. Follows two-step commands. Diminished recall. Extraocular movements are pharyngeal or nystagmus. Blinks to threat bilaterally. Fundi were not visualized. Face is symmetric without weakness. Not cooperative for detailed visual field testing but suspect left superior quadrant vision field loss. Face is symmetric. Tongue is midline. Motor system exam no upper or lower extremity drift. Symmetric and equal strength in all 4 extremities. No focal weakness. Deep tendon reflexes are symmetric plantars are downgoing. Gait not tested.   ASSESSMENT/PLAN Gabriel Romero is a 66 y.o. male with history of hypertension, alcohol abuse admitted for a seizure and found to have an intracranial temporal lobe hemorrhage.   Right Temporal lobe intraparenchymal hematoma: Unclear if the hematoma is secondary to fall, tumor, aneurysm or AVM.   CT head: Small area of acute hemorrhage in the right lateral temporal lobe. Hemorrhage volume 3.3 mL. No significant surrounding edema or mass lesion. Hypodensity in the right parietal lobe most likely a chronic  infarct.  CTA Brain: Negative CTA of the brain. No AVM or other vascular abnormality seen underlying the right temporal lobe hemorrhage. Atheromatous plaque involving the carotid siphons with moderate to advanced multifocal narrowing (approximately 50-75%), left greater than right.   Repeat CT Head 3/30: Stable right anterolateral temporal lobe hematoma. Stable advanced chronic microvascular ischemic changes, chronic infarctions, and volume loss of the brain.  MRI - pending   Carotid Doppler: Consistent with a high end 60 - 79 percent stenosis involving the proximal Lt ICA.   Findings consistent with a high end 1- 39 percent stenosis involving the proximal right internal carotid artery. Intimal thickening vs soft plaque bilaterally.  Bilateral vertebral arteries are patent and antegrade.  2D Echo - EF 40-45%. No cardiac source of emboli identified.  LDL 123  HgbA1c 5.6  SCDs for VTE prophylaxis given ICH  Diet Heart Room service appropriate? Yes; Fluid consistency: Thin   No antithrombotic prior to admission, now on No antithrombotic  Ongoing aggressive stroke risk factor management  Therapy recommendations:  CIR  Disposition:  Pending  Seizure:  On Keppra 500 mg BID  EEG - mild gen slowing. No seizures  No history of seizures or recurrent seizures   Alcohol level on admission <5, UDS positive for benzos, UA negative, TSH normal, HIV and RPR non  reactive  Alcohol Withdrawal  Patient became very agitated yesterday requiring haldol and precedex gtt  Heavy alcohol use at home, today patient agitated and uncooperative  precedex held due to bradycardia  CIWA - transfer back to the ICU. Critical care to manage withdrawal.   Blood pressure significantly elevated in agitated state.  Diabetes  HgbA1c 5.6 goal < 7.0  CBG monitoring   Hypertension  Home meds: None  history of uncontrolled HTN, but not taking any medications at home  Maintain BP 280-034J  systolic-Cleviprex prn    Hyperlipidemia  Home meds: None  Currently on atorvastatin 20 mg QD  LDL 123, goal < 70  Continue statin at discharge  Other Stroke Risk Factors  Cigarette smoker, advised to stop smoking  ETOH use  Obesity, Body mass index is 28.47 kg/m.   Other Active Problems  CKD III:  Creatinine 1.5-1.8  Alcohol Abuse: Drinks 8 beers per day. On CIWA, Precedex   Tobacco abuse- Nicotine patch  Hospital day # [place] PA-C [facility] Pager [phone] 06/22/2016, 1:57 PM I have personally examined this patient, reviewed notes, independently viewed imaging studies, participated in medical decision making and plan of care.ROS completed by me personally and pertinent positives fully documented  I have made any additions or clarifications directly to the above note. Agree with note above. The patient`s agitation from alcohol withdrwal  Has made it difficult to provide care on floor hence will transfer back to ICU and transfer care to [facility] service for medical management. D/W Dr Chase Caller. And patient`s daughter over the phone and answered questions. Check CT head when stable. Stroke will continue to follow. This patient is critically ill and at significant risk of neurological worsening, death and care requires constant monitoring of vital signs, hemodynamics,respiratory and cardiac monitoring, extensive review of multiple databases, frequent neurological assessment, discussion with family, other specialists and medical decision making of high complexity.I have made any additions or clarifications directly to the above note.This critical care time does not reflect procedure time, or teaching time or supervisory time of PA/NP/Med Resident etc but could involve care discussion time.  I spent 30 minutes of neurocritical care time  in the care of  this patient.     Antony Contras, MD Medical Director [place] Pager:  [phone] 06/22/2016 2:12 PM    To contact Stroke Continuity provider, please refer to [email or web address] After hours, contact General Neurology

## 2016-06-22 NOTE — Consult Note (Signed)
PULMONARY / CRITICAL CARE MEDICINE   Name: Gabriel Romero MRN: [id number] DOB: Jul 29, 1950 PCP No PCP Per Patient LOS 4 as of 06/22/2016     ADMISSION DATE:  06/18/2016 CONSULTATION DATE:  4./1/18  REFERRING MD:  Dr Leonie Man  CHIEF COMPLAINT:  Acute agitated encephalopathy due to etoh wd  HISTORY OF PRESENT ILLNESS:   66 year old obese male with severe hypertension and hx of etoh intake nos. Admitted 06/18/2016 with new onste seizures and associated temporal lobe hemorrhage. Chart review shows that even  As of 06/19/16 he developed agitated encephalopathy initially needing haldol and precedex gtt. Daughter also described hx of snorign with pauses concerning for undiagnosed OSA. Also rx with cleviprex of high bp. EEG 3/31 with toxic encephalopaty. On 06/21/16 precedex stopped due to bradycardia HR 40-50s and moved to 89M medical floor. Overnight there severe agitatino with fall out of bed needing bodygaruds and increased ativan. PCCM consulted for take over primary service and and agitation management. Neuro will stay on as consult  PAST MEDICAL HISTORY :  He  has no past medical history on file.  PAST SURGICAL HISTORY: He  has no past surgical history on file.  Allergies  Allergen Reactions  . Sulfa Antibiotics Rash    No current facility-administered medications on file prior to encounter.    No current outpatient prescriptions on file prior to encounter.    FAMILY HISTORY:  His has no family status information on file.    SOCIAL HISTORY: He  reports that he has never smoked. He does not have any smokeless tobacco history on file. He reports that he drinks alcohol.  REVIEW OF SYSTEMS:   unobtainable   VITAL SIGNS: BP (!) 215/97   Pulse 84   Temp 97.5 F (36.4 C) (Oral)   Resp 20   Ht 5\' 10"  (1.778 m)   Wt 90 kg (198 lb 6.6 oz)   SpO2 92%   BMI 28.47 kg/m   HEMODYNAMICS:    VENTILATOR SETTINGS:    INTAKE / OUTPUT: I/O last 3 completed shifts: In: 0350 [P.O.:720;  I.V.:2310; IV Piggyback:315] Out: 945 [Urine:945]     EXAM  General Appearance:    Looks ill. OBESE. Sleeping and snoring after ativan. Lying flat in bed with restraints  Head:    Normocephalic, without obvious abnormality, atraumatic  Eyes:    PERRL - yes, conjunctiva/corneas - yes      Ears:    Normal external ear canals, both ears  Nose:   NG tube - no  Throat:  ETT TUBE - no , OG tube - no  Neck:   Supple,  No enlargement/tenderness/nodules     Lungs:     Clear to auscultation bilaterally,  Chest wall:    No deformity  Heart:    S1 and S2 normal, no murmur, CVP - no.  Pressors - no  Abdomen:     Soft, no masses, no organomegaly  Genitalia:    Not done  Rectal:   not done  Extremities:   Extremities- intact     Skin:   Intact in exposed areas .      Neurologic:   Sedation - post ativan -> RASS - -3 . Moves all 4s - was when agitated. CAM-ICU - confused when agitatee . Orientation - not oriented when agitated       LABS  PULMONARY No results for input(s): PHART, PCO2ART, PO2ART, HCO3, TCO2, O2SAT in the last 168 hours.  Invalid input(s): PCO2, PO2  CBC  Recent Labs Lab 06/19/16 1225 06/20/16 0631 06/21/16 0322  HGB 14.9 13.6 14.1  HCT 44.6 41.5 42.8  WBC 9.8 8.0 10.2  PLT 204 169 172    COAGULATION  Recent Labs Lab 06/18/16 1530  INR 1.07    CARDIAC  No results for input(s): TROPONINI in the last 168 hours. No results for input(s): PROBNP in the last 168 hours.   CHEMISTRY  Recent Labs Lab 06/18/16 1530 06/19/16 1225 06/20/16 0631 06/21/16 0322  NA 140 140 140 143  K 3.7 3.5 3.4* 3.5  CL 106 106 107 113*  CO2 19* 22 24 22   GLUCOSE 188* 120* 101* 97  BUN 15 17 22* 23*  CREATININE 1.56* 1.62* 1.82* 1.59*  CALCIUM 8.8* 9.1 8.7* 8.6*   Estimated Creatinine Clearance: 51.6 mL/min (A) (by C-G formula based on SCr of 1.59 mg/dL (H)).   LIVER  Recent Labs Lab 06/18/16 1530  AST 27  ALT 18  ALKPHOS 69  BILITOT 1.3*  PROT 7.4   ALBUMIN 4.2  INR 1.07     INFECTIOUS No results for input(s): LATICACIDVEN, PROCALCITON in the last 168 hours.   ENDOCRINE CBG (last 3)  No results for input(s): GLUCAP in the last 72 hours.       IMAGING x48h  - image(s) personally visualized  -   highlighted in bold No results found.     ASSESSMENT and PLAN  Alcohol withdrawal syndrome, with delirium ([place]) Start precedex gtt - ok to accept HR 40-50 if precedex is working (avoid bolus) and no sinus pauses. Sometimes we can aid some neo to help with BP   Ativan prn  Intubate with diprivan if worse  ICH (intracerebral hemorrhage) ([place]) Per neuro  Seizure [facility]) Per neuro      FAMILY  - Updates: 06/22/2016 --> no family at bedside. D/w Dr Leonie Man over [place] family meet or Palliative Care meeting due by:  DAy 7. Current LOS is LOS 4 days  CODE STATUS    Code Status Orders        Start     Ordered   06/18/16 1854  Full code  Continuous     06/18/16 1853    Code Status History    Date Active Date Inactive Code Status Order ID Comments User Context   This patient has a current code status but no historical code status.        DISPO Keep in ICU       The patient is critically ill with multiple organ systems failure and requires high complexity decision making for assessment and support, frequent evaluation and titration of therapies, application of advanced monitoring technologies and extensive interpretation of multiple databases.   Critical Care Time devoted to patient care services described in this note is  30  Minutes. This time reflects time of care of this signee Dr Brand Males. This critical care time does not reflect procedure time, or teaching time or supervisory time of PA/NP/Med student/Med Resident etc but could involve care discussion time    Dr. Brand Males, M.D., [facility].C.P Pulmonary and Critical Care Medicine Staff Physician [place] [facility] Pager: [phone], If no answer or between  15:00h - 7:00h: call [phone]  06/22/2016 11:02 AM

## 2016-06-22 NOTE — Progress Notes (Signed)
Patient anxiety unrelieved by 1 mg PO ativan. Patient requires continual reorientation and reassurance which is provided by assigned RN. Patient anxiety and agitation relieved for 5-10 min. Patient became combative with staff members. Environmental stimuli decreased. RN continues to monitor patient and patient safety. Neurology paged for ativan alternative.

## 2016-06-22 NOTE — Progress Notes (Signed)
06/22/16 0600  What Happened  Was fall witnessed? No  Was patient injured? No  Patient found on floor  Found by Staff-comment Estill Bamberg, RN)  Stated prior activity other (comment)  Follow Up  MD notified [place]  Time MD notified [phone]  Family notified No- patient refusal  Simple treatment (assessment and monitoring)  Progress note created (see row info) Yes  Adult Fall Risk Assessment  Risk Factor Category (scoring not indicated) High fall risk per protocol (document High fall risk)  Patient's Fall Risk High Fall Risk (>13 points)  Adult Fall Risk Interventions  Required Bundle Interventions *See Row Information* High fall risk - low, moderate, and high requirements implemented  Additional Interventions Family Supervision;Use of appropriate toileting equipment (bedpan, BSC, etc.);Other (Comment);Reorient/diversional activities with confused patients (floor mats)  Screening for Fall Injury Risk  Risk For Fall Injury- See Row Information  Bleeding Risk - anticoagulation (not prophylaxis)  Required Injury Bundle Interventions *See Row Information* (Injury buddle continued)  Vitals  Temp 97.5 F (36.4 C)  Temp Source Oral  BP (!) 183/89  MAP (mmHg) 116  BP Location Right Arm  BP Method Automatic  Patient Position (if appropriate) Sitting  Pulse Rate 79  Pulse Rate Source Dinamap  Resp (!) 21  Oxygen Therapy  SpO2 97 %  O2 Device Room Air  Pain Assessment  Pain Assessment No/denies pain  Neurological  Neuro (WDL) X  Level of Consciousness Alert  Orientation Level Oriented to person;Oriented to place;Oriented to time;Disoriented to situation  Cognition Follows commands;Memory impairment  Speech Clear  Pupil Assessment  Yes  R Pupil Size (mm) 3  R Pupil Shape Round  R Pupil Reaction Sluggish  L Pupil Size (mm) 3  L Pupil Shape Round  L Pupil Reaction Sluggish  Additional Pupil Assessments No  Motor Function/Sensation Assessment Motor response;Motor strength  Facial  Symmetry Symmetrical  R Hand Grip Strong  L Hand Grip Strong  RUE Motor Response Purposeful movement  RUE Sensation Full sensation  RUE Motor Strength 5  LUE Motor Response Purposeful movement  LUE Sensation Full sensation  LUE Motor Strength 5  RLE Motor Response Purposeful movement  RLE Sensation Full sensation  RLE Motor Strength 4  LLE Motor Response Purposeful movement  LLE Sensation Full sensation  LLE Motor Strength 4  Neuro Symptoms Anxiety;Agitation;Forgetful  Neuro Additional Assessments Yes  Glasgow Coma Scale  Eye Opening 4  Best Verbal Response (NON-intubated) 4  Modified Verbal Response (INTUBATED) 3  Best Motor Response 6  Glasgow Coma Scale Score (!) 17  NIH Stroke Scale ( + Modified Stroke Scale Criteria)   Interval Other (Comment)  Level of Consciousness (1a.)    0  LOC Questions (1b. )   + 1  LOC Commands (1c. )   +  0  Best Gaze (2. )  + 0  Visual (3. )  + 0  Facial Palsy (4. )     0  Motor Arm, Left (5a. )   + 0  Motor Arm, Right (5b. )   + 0  Motor Leg, Left (6a. )   + 0  Motor Leg, Right (6b. )   + 0  Limb Ataxia (7. ) 1  Sensory (8. )   + 0  Best Language (9. )   + 0  Dysarthria (10. ) 1  Extinction/Inattention (11.)   + 0  Total 3  Musculoskeletal  Musculoskeletal (WDL) X  Assistive Device BSC;Front wheel walker (Patient refuses to use intermittantly)  Generalized  Weakness Yes  Weight Bearing Restrictions No  Integumentary  Integumentary (WDL) X  RN Assisting with Skin Assessment on Admission Amanda, RN  Skin Color Appropriate for ethnicity  Skin Condition Dry  Skin Integrity Abrasion;Ecchymosis  Abrasion Location Knee;Arm  Abrasion Location Orientation Bilateral  Abrasion Intervention Other (Comment) (present prior to fall; assessed)  Ecchymosis Location Arm;Knee  Ecchymosis Location Orientation Left;Right (BUE, left knee)  Ecchymosis Intervention (present prior to fall; assessed)  Skin Turgor Non-tenting   Patient continually  reoriented and reassured, bed/chair alarm active, non-slip yellow socks on patient, floor mats in place. MD notified of patients anxiety and agitation prior to fall. Medication administered per order; see MAR. Assigned RN provided 1:1 care due to patient being combative (grabbing, kicking, punching) with other staff members on unit earlier in shift. Patient was not combative with assigned RN. RN continued to provide 1:1 care for patient safety. Patients son left in order to decrease patients anxiety, patient sleeping in bed, bed alarm active, all fall precautions in place prior to fall. RN received phone call from CN informing of patient fall. RN entered room, patient urinated in bed and on floor, was pulling gown, cardiac monitor off stating he had to get home. RN informed that patient was found on knee next to bed prior to RN arrival. Patient BP elevated, MD notified. RN will continue to monitor.

## 2016-06-22 NOTE — Progress Notes (Signed)
   06/22/16 0619  Vitals  BP (!) 163/79  MAP (mmHg) 104  BP Location Right Arm  BP Method Automatic  Patient Position (if appropriate) Sitting  Pulse Rate 74  Pulse Rate Source Dinamap  Resp 20  Oxygen Therapy  SpO2 97 %  O2 Device Room Air  Pain Assessment  Pain Assessment 0-10  Pain Score Asleep   Post fall reassessment of Patients vitals. Patient sitting in recliner, sleeping, chair alarm active, non-slip socks on, floor mats in place. RN will continue to monitor.

## 2016-06-22 NOTE — Progress Notes (Signed)
Per Dr. Cheral Marker restart IV precedex, monitor patients HR and BP closely and may give ativan if needed. RN notified pharmacy and will continue to monitor patient.

## 2016-06-23 ENCOUNTER — Inpatient Hospital Stay (HOSPITAL_COMMUNITY): Payer: Medicare HMO

## 2016-06-23 ENCOUNTER — Encounter (HOSPITAL_COMMUNITY): Payer: Self-pay | Admitting: General Practice

## 2016-06-23 DIAGNOSIS — I610 Nontraumatic intracerebral hemorrhage in hemisphere, subcortical: Secondary | ICD-10-CM

## 2016-06-23 LAB — BASIC METABOLIC PANEL
Anion gap: 9 (ref 5–15)
BUN: 17 mg/dL (ref 6–20)
CHLORIDE: 116 mmol/L — AB (ref 101–111)
CO2: 22 mmol/L (ref 22–32)
CREATININE: 1.4 mg/dL — AB (ref 0.61–1.24)
Calcium: 8.6 mg/dL — ABNORMAL LOW (ref 8.9–10.3)
GFR, EST AFRICAN AMERICAN: 59 mL/min — AB (ref >60)
GFR, EST NON AFRICAN AMERICAN: 51 mL/min — AB (ref >60)
Glucose, Bld: 106 mg/dL — ABNORMAL HIGH (ref 65–99)
Potassium: 4.2 mmol/L (ref 3.5–5.1)
SODIUM: 147 mmol/L — AB (ref 135–145)

## 2016-06-23 LAB — PHOSPHORUS: Phosphorus: 4.2 mg/dL (ref 2.5–4.6)

## 2016-06-23 LAB — CBC WITH DIFFERENTIAL/PLATELET
BASOS ABS: 0 10*3/uL (ref 0.0–0.1)
BASOS PCT: 0 %
EOS ABS: 0.2 10*3/uL (ref 0.0–0.7)
EOS PCT: 2 %
HCT: 39.6 % (ref 39.0–52.0)
Hemoglobin: 13 g/dL (ref 13.0–17.0)
Lymphocytes Relative: 13 %
Lymphs Abs: 1.1 10*3/uL (ref 0.7–4.0)
MCH: 31.8 pg (ref 26.0–34.0)
MCHC: 32.8 g/dL (ref 30.0–36.0)
MCV: 96.8 fL (ref 78.0–100.0)
MONO ABS: 0.6 10*3/uL (ref 0.1–1.0)
Monocytes Relative: 7 %
Neutro Abs: 6.2 10*3/uL (ref 1.7–7.7)
Neutrophils Relative %: 78 %
PLATELETS: 162 10*3/uL (ref 150–400)
RBC: 4.09 MIL/uL — AB (ref 4.22–5.81)
RDW: 13.7 % (ref 11.5–15.5)
WBC: 8 10*3/uL (ref 4.0–10.5)

## 2016-06-23 LAB — MAGNESIUM: MAGNESIUM: 2.1 mg/dL (ref 1.7–2.4)

## 2016-06-23 MED ORDER — DIAZEPAM 5 MG PO TABS
5.0000 mg | ORAL_TABLET | Freq: Three times a day (TID) | ORAL | Status: DC
  Administered 2016-06-23 – 2016-06-28 (×17): 5 mg via ORAL
  Filled 2016-06-23 (×17): qty 1

## 2016-06-23 MED ORDER — PNEUMOCOCCAL VAC POLYVALENT 25 MCG/0.5ML IJ INJ
0.5000 mL | INJECTION | INTRAMUSCULAR | Status: AC
  Administered 2016-06-24: 0.5 mL via INTRAMUSCULAR
  Filled 2016-06-23: qty 0.5

## 2016-06-23 MED ORDER — PANTOPRAZOLE SODIUM 40 MG PO TBEC
40.0000 mg | DELAYED_RELEASE_TABLET | Freq: Every day | ORAL | Status: DC
  Administered 2016-06-23 – 2016-06-28 (×6): 40 mg via ORAL
  Filled 2016-06-23 (×6): qty 1

## 2016-06-23 MED ORDER — THIAMINE HCL 100 MG/ML IJ SOLN
100.0000 mg | Freq: Every day | INTRAMUSCULAR | Status: DC
  Administered 2016-06-23: 100 mg via INTRAVENOUS
  Filled 2016-06-23: qty 2

## 2016-06-23 MED ORDER — ATORVASTATIN CALCIUM 10 MG PO TABS
20.0000 mg | ORAL_TABLET | Freq: Every day | ORAL | Status: DC
  Administered 2016-06-23 – 2016-06-28 (×6): 20 mg via ORAL
  Filled 2016-06-23 (×6): qty 2
  Filled 2016-06-23: qty 1

## 2016-06-23 MED ORDER — PROSIGHT PO TABS
1.0000 | ORAL_TABLET | Freq: Every day | ORAL | Status: DC
  Administered 2016-06-23 – 2016-06-28 (×6): 1 via ORAL
  Filled 2016-06-23 (×6): qty 1

## 2016-06-23 NOTE — Progress Notes (Signed)
Inpatient Rehabilitation  Met with patient and family to discuss their decision.  They are in favor of IP Rehab post acute care stay.  Plan to follow along for timing of medical readiness and insurance authorization.  Please call with questions.   Carmelia Roller., CCC/SLP Admission Coordinator  [place]  Cell [phone]

## 2016-06-23 NOTE — Evaluation (Signed)
Occupational Therapy Evaluation Patient Details Name: Gabriel Romero MRN: [id number] DOB: [birth year] Today's Date: 06/23/2016    History of Present Illness Pt is a 66 y.o. male admitted to ED on 06/18/16 with new onset seizures after a fall. CT shows acute hemorrhage in R lateral temporal lobe; C-spine clear. Pertinent PMH includes severe HTN, alcohol use.     Clinical Impression   PT admitted with R temporal lobe hemorrhage. Pt currently with functional limitiations due to the deficits listed below (see OT problem list). PTA was independent with adls and ETOH dependency. Pt will benefit from skilled OT to increase their independence and safety with adls and balance to allow discharge CIR. Pt currently with high fall risk with all adls.      Follow Up Recommendations  CIR    Equipment Recommendations       Recommendations for Other Services Speech consult     Precautions / Restrictions Precautions Precautions: Fall Restrictions Weight Bearing Restrictions: No      Mobility Bed Mobility               General bed mobility comments: in chair on arrival  Transfers Overall transfer level: Needs assistance Equipment used: Rolling walker (2 wheeled) Transfers: Sit to/from Stand Sit to Stand: Min assist         General transfer comment: cues for hand placement to stand and to descend to chair     Balance Overall balance assessment: Needs assistance Sitting-balance support: No upper extremity supported;Feet supported Sitting balance-Leahy Scale: Fair     Standing balance support: No upper extremity supported;During functional activity Standing balance-Leahy Scale: Poor                             ADL either performed or assessed with clinical judgement   ADL Overall ADL's : Needs assistance/impaired Eating/Feeding: Minimal assistance;Sitting Eating/Feeding Details (indicate cue type and reason): pt with grape juice spilled down front of patient  with no awareness Grooming: Wash/dry face;Min guard;Sitting   Upper Body Bathing: Moderate assistance   Lower Body Bathing: Maximal assistance           Toilet Transfer: Minimal assistance           Functional mobility during ADLs: Minimal assistance General ADL Comments: pt with R side of RW bumping into objects and R LE weakness with gait velocity     Vision Baseline Vision/History: Wears glasses Wears Glasses: Reading only Patient Visual Report: Blurring of vision       Perception     Praxis      Pertinent Vitals/Pain Pain Assessment: No/denies pain     Hand Dominance Right   Extremity/Trunk Assessment Upper Extremity Assessment Upper Extremity Assessment: RUE deficits/detail RUE Deficits / Details: c/o of pain with abduction of shoulder but reports its getting better   Lower Extremity Assessment Lower Extremity Assessment: Generalized weakness   Cervical / Trunk Assessment Cervical / Trunk Assessment: Normal   Communication Communication Communication: No difficulties   Cognition Arousal/Alertness: Awake/alert Behavior During Therapy: Anxious (Tearful throughout session) Overall Cognitive Status: Impaired/Different from baseline Area of Impairment: Orientation;Attention;Memory;Following commands                 Orientation Level: Disoriented to;Situation Current Attention Level: Sustained Memory: Decreased short-term memory Following Commands: Follows one step commands inconsistently;Follows one step commands with increased time;Follows multi-step commands inconsistently Safety/Judgement: Decreased awareness of safety;Decreased awareness of deficits Awareness: Intellectual Problem Solving: Requires verbal  cues;Slow processing General Comments: Pt emotional and tearful throughout session, stating "something is wrong with me"; disoriented to reason for admission, although explained this per RN and again by therapy. Decreased initiation for all  tasks, requiring multimodal cues to redirect. Pt reports letter from daughter was written prior to admission and that he needs to talk to his kids. pt fixed on talking to children. Pt laughing throughout session inappropriately.    General Comments       Exercises     Shoulder Instructions      Home Living Family/patient expects to be discharged to:: Private residence Living Arrangements: Children (Son) Available Help at Discharge: Available PRN/intermittently;Family (Son works full-time) Type of Home: [employer] Access: Stairs to enter Technical brewer of Steps: 3 Entrance Stairs-Rails: None Home Layout: Two level;Bed/bath upstairs Alternate Level Stairs-Number of Steps: 15 Alternate Level Stairs-Rails: None           Home Equipment: None          Prior Functioning/Environment Level of Independence: Independent        Comments: Pt works full-time; drives.         OT Problem List: Decreased strength;Decreased activity tolerance;Impaired balance (sitting and/or standing);Decreased cognition;Decreased safety awareness;Decreased knowledge of use of DME or AE;Decreased knowledge of precautions;Pain;Impaired UE functional use      OT Treatment/Interventions: Self-care/ADL training;Therapeutic exercise;Neuromuscular education;DME and/or AE instruction;Therapeutic activities;Cognitive remediation/compensation;Visual/perceptual remediation/compensation;Patient/family education;Balance training    OT Goals(Current goals can be found in the care plan section) Acute Rehab OT Goals Patient Stated Goal: Return home OT Goal Formulation: Patient unable to participate in goal setting Potential to Achieve Goals: Good  OT Frequency: Min 2X/week   Barriers to D/C: Decreased caregiver support          Co-evaluation              End of Session Equipment Utilized During Treatment: Gait belt;Rolling walker Nurse Communication: Mobility status;Precautions  Activity  Tolerance: Patient tolerated treatment well Patient left: in chair;with call bell/phone within reach;with chair alarm set  OT Visit Diagnosis: Unsteadiness on feet (R26.81)                Time: 6579-0383 OT Time Calculation (min): 20 min Charges:  OT General Charges $OT Visit: 1 Procedure OT Evaluation $OT Eval Moderate Complexity: 1 Procedure G-Codes:      Jeri Modena   OTR/L Pager: [phone] Office: [phone] .   Parke Poisson B 06/23/2016, 4:18 PM

## 2016-06-23 NOTE — Progress Notes (Signed)
STROKE TEAM PROGRESS NOTE   SUBJECTIVE (INTERVAL HISTORY) Patient was  transferred to neurological ICU yesterday and started on Precedex drip. He seems calm and cooperative. Repeat CT scan of the head has been ordered but not completed yet OBJECTIVE  Recent Labs Lab 06/18/16 1522  GLUCAP 173*    Recent Labs Lab 06/19/16 1225 06/20/16 0631 06/21/16 0322 06/22/16 1232 06/23/16 0501  NA 140 140 143 142 147*  K 3.5 3.4* 3.5 3.4* 4.2  CL 106 107 113* 115* 116*  CO2 22 24 22  20* 22  GLUCOSE 120* 101* 97 116* 106*  BUN 17 22* 23* 16 17  CREATININE 1.62* 1.82* 1.59* 1.37* 1.40*  CALCIUM 9.1 8.7* 8.6* 8.6* 8.6*  MG  --   --   --  1.8 2.1  PHOS  --   --   --  3.4 4.2    Recent Labs Lab 06/18/16 1530  AST 27  ALT 18  ALKPHOS 69  BILITOT 1.3*  PROT 7.4  ALBUMIN 4.2    Recent Labs Lab 06/19/16 1225 06/20/16 0631 06/21/16 0322 06/22/16 1232 06/23/16 0501  WBC 9.8 8.0 10.2 9.3 8.0  NEUTROABS  --   --   --   --  6.2  HGB 14.9 13.6 14.1 13.2 13.0  HCT 44.6 41.5 42.8 40.8 39.6  MCV 94.3 95.8 96.2 95.6 96.8  PLT 204 169 172 151 162   No results for input(s): LABPROT, INR in the last 72 hours. No results for input(s): COLORURINE, LABSPEC, [place], GLUCOSEU, HGBUR, BILIRUBINUR, KETONESUR, PROTEINUR, UROBILINOGEN, NITRITE, LEUKOCYTESUR in the last 72 hours.  Invalid input(s): APPERANCEUR      Component Value Date/Time   LABOPIA NONE DETECTED 06/18/2016 1743   COCAINSCRNUR NONE DETECTED 06/18/2016 1743   LABBENZ POSITIVE (A) 06/18/2016 1743   AMPHETMU NONE DETECTED 06/18/2016 1743   THCU NONE DETECTED 06/18/2016 1743   LABBARB NONE DETECTED 06/18/2016 1743     Recent Labs Lab 06/18/16 1530  ETH <5    Imaging  Ct Angio Head W Or Wo Contrast 06/18/2016 1. Negative CTA of the brain. No acute vascular abnormality identified. No AVM or other vascular abnormality seen underlying the right temporal lobe hemorrhage.  2. Atheromatous plaque involving the carotid  siphons with moderate to advanced multifocal narrowing (approximately 50-75%), left greater than right.  3. No significant interval change in size and appearance of right temporal lobe hemorrhage, measuring 2.6 x 1.7 x 1.8 cm. No significant mass effect.      Ct Head Wo Contrast 06/20/2016 1. Stable right anterolateral temporal lobe hematoma.  2. Stable advanced chronic microvascular ischemic changes, chronic infarctions, and volume loss of the brain.  3. Stable mildly displaced acute nasal bone fractures.  4. No new acute infarct or hemorrhage identified.    Ct Head Wo Contrast 06/19/2016 1. Stable acute hemorrhage within the right lateral temporal lobe. This area is atypical for hypertensive hemorrhage and traumatic hemorrhagic contusion or underlying hemorrhagic mass should be considered.  2. Stable advanced chronic microvascular ischemic changes of the brain, chronic infarcts, and volume loss.  3. Acute appearing mildly displaced nasal bone fractures and chronic left lamina papyracea fracture are stable.     Ct Head Wo Contrast 06/18/2016 Small area of acute hemorrhage in the right lateral temporal lobe. Hemorrhage volume 3.3 mL.  No significant surrounding edema or mass lesion.  Differential considerations would include a traumatic hemorrhagic contusion. Venous infarction is possible.  Hemorrhagic mass or infarct are consider less likely. Further evaluation with  MRI and CTA suggested.  Generalized atrophy with diffuse white matter disease most likely chronic.  Hypodensity in the right parietal lobe most likely a chronic infarct.     Ct Cervical Spine Wo Contrast 06/18/2016 No evidence of acute cervical spine fracture, traumatic subluxation or static signs of instability. Multilevel spondylosis, contributing to spinal and foraminal narrowing as described.    Carotid Doppler: Preliminary findings: Consistent with a high end 60 - 79 percent stenosis involving the proximal left  internal carotid artery. Findings consistent with a high end 1- 39 percent stenosis involving the proximal right internal carotid artery. Intimal thickening vs soft plaque bilaterally.  Bilateral vertebral arteries are patent and antegrade.    PHYSICAL EXAM Vitals:   06/23/16 0900 06/23/16 1000 06/23/16 1100 06/23/16 1200  BP: (!) 160/91 (!) 153/89 (!) 144/83 (!) 165/90  Pulse: (!) 54 (!) 53 (!) 53 (!) 46  Resp: (!) 25 17 (!) 21 12  Temp:    98 F (36.7 C)  TempSrc:    Axillary  SpO2: 93% 90% 96% 98%  Weight:      Height:       General: Vital signs reviewed.  Patient is well-developed and well-nourished, in no acute distress and    Cardiovascular: Bradycardic, regular rhythm, no murmurs, gallops, or rubs. Pulmonary/Chest: Clear to auscultation bilaterally, no wheezes, rales, or rhonchi. Abdominal: Soft, non-tender, non-distended, BS + Neurological Exam  :  Drowsy but opens eyes and follows commands. Diminished attention, registration and recall. Normal facial apraxia or dysarthria. Follows two-step commands. Diminished recall. Extraocular movements are pharyngeal or nystagmus. Blinks to threat bilaterally. Fundi were not visualized. Face is symmetric without weakness. Not cooperative for detailed visual field testing but suspect left superior quadrant vision field loss. Face is symmetric. Tongue is midline. Motor system exam no upper or lower extremity drift. Symmetric and equal strength in all 4 extremities. No focal weakness. Deep tendon reflexes are symmetric plantars are downgoing. Gait not tested.   ASSESSMENT/PLAN Gabriel Romero is a 66 y.o. male with history of hypertension, alcohol abuse admitted for a seizure and found to have an intracranial temporal lobe hemorrhage.   Right Temporal lobe intraparenchymal hematoma: Unclear if the hematoma is secondary to fall, tumor, aneurysm or AVM.   CT head: Small area of acute hemorrhage in the right lateral temporal lobe. Hemorrhage  volume 3.3 mL. No significant surrounding edema or mass lesion. Hypodensity in the right parietal lobe most likely a chronic infarct.  CTA Brain: Negative CTA of the brain. No AVM or other vascular abnormality seen underlying the right temporal lobe hemorrhage. Atheromatous plaque involving the carotid siphons with moderate to advanced multifocal narrowing (approximately 50-75%), left greater than right.   Repeat CT Head 3/30: Stable right anterolateral temporal lobe hematoma. Stable advanced chronic microvascular ischemic changes, chronic infarctions, and volume loss of the brain.  MRI - pending   Carotid Doppler: Consistent with a high end 60 - 79 percent stenosis involving the proximal Lt ICA.   Findings consistent with a high end 1- 39 percent stenosis involving the proximal right internal carotid artery. Intimal thickening vs soft plaque bilaterally.  Bilateral vertebral arteries are patent and antegrade.  2D Echo - EF 40-45%. No cardiac source of emboli identified.  LDL 123  HgbA1c 5.6  SCDs for VTE prophylaxis given ICH  Diet regular Room service appropriate? Yes; Fluid consistency: Thin   No antithrombotic prior to admission, now on No antithrombotic  Ongoing aggressive stroke risk factor management  Therapy recommendations:  CIR  Disposition:  Pending  Seizure:  On Keppra 500 mg BID  EEG - mild gen slowing. No seizures  No history of seizures or recurrent seizures   Alcohol level on admission <5, UDS positive for benzos, UA negative, TSH normal, HIV and RPR non reactive  Alcohol Withdrawal  Patient became very agitated yesterday requiring haldol and precedex gtt  Heavy alcohol use at home, today patient agitated and uncooperative  precedex held due to bradycardia  CIWA - transfer back to the ICU. Critical care to manage withdrawal.   Blood pressure significantly elevated in agitated state.  Diabetes  HgbA1c 5.6 goal < 7.0  CBG monitoring    Hypertension  Home meds: None  history of uncontrolled HTN, but not taking any medications at home  Maintain BP [id number] systolic-Cleviprex prn    Hyperlipidemia  Home meds: None  Currently on atorvastatin 20 mg QD  LDL 123, goal < 70  Continue statin at discharge  Other Stroke Risk Factors  Cigarette smoker, advised to stop smoking  ETOH use  Obesity, Body mass index is 28.47 kg/m.   Other Active Problems  CKD III:  Creatinine 1.5-1.8  Alcohol Abuse: Drinks 8 beers per day. On CIWA, Precedex   Tobacco abuse- Nicotine patch  Hospital day # 5    I have personally examined this patient, reviewed notes, independently viewed imaging studies, participated in medical decision making and plan of care.ROS completed by me personally and pertinent positives fully documented  I have made any additions or clarifications directly to the above note.   D/W Dr Ashok Cordia.No family at bedside Check CT head today. Stroke will continue to follow. This patient is critically ill and at significant risk of neurological worsening, death and care requires constant monitoring of vital signs, hemodynamics,respiratory and cardiac monitoring, extensive review of multiple databases, frequent neurological assessment, discussion with family, other specialists and medical decision making of high complexity.I have made any additions or clarifications directly to the above note.This critical care time does not reflect procedure time, or teaching time or supervisory time of PA/NP/Med Resident etc but could involve care discussion time.  I spent 30 minutes of neurocritical care time  in the care of  this patient.     Antony Contras, MD Medical Director [facility] Pager: [phone] 06/23/2016 1:17 PM    To contact Stroke Continuity provider, please refer to [email or web address] After hours, contact General Neurology

## 2016-06-23 NOTE — Progress Notes (Signed)
qPhysical Therapy Treatment Patient Details Name: Gabriel Romero MRN: [id number] DOB: 10-05-1950 Today's Date: 06/23/2016    History of Present Illness Pt is a 66 y.o. male admitted to ED on 06/18/16 with new onset seizures after a fall. CT shows acute hemorrhage in R lateral temporal lobe; C-spine clear. Pertinent PMH includes severe HTN, alcohol use.      PT Comments    Pt progressing with mobility, demonstrating less impulsivity with movement. Today, pt able to perform bed mobility, pre-gait, and take steps to chair with RW and min guard to minA for balance. Pt remains confused and emotional, at times becoming tearful during session, reporting anxiety about his condition. Pleasant and motivated to work with PT. Will continue to follow acutely.     Follow Up Recommendations  CIR;Supervision/Assistance - 24 hour     Equipment Recommendations  Other (comment) (Defer to next venue)    Recommendations for Other Services Rehab consult     Precautions / Restrictions Precautions Precautions: Fall Restrictions Weight Bearing Restrictions: No    Mobility  Bed Mobility Overal bed mobility: Needs Assistance Bed Mobility: Supine to Sit     Supine to sit: Min assist;HOB elevated     General bed mobility comments: Increased time with bed mob with minA for BUE support into sitting; pt able to scoot to EOB with min guard. Required multimodal cues to direct to task.  Transfers Overall transfer level: Needs assistance Equipment used: Rolling walker (2 wheeled) Transfers: Sit to/from Stand Sit to Stand: Min assist         General transfer comment: Pt able to stand on 2nd attempt with RW and min guard. Cues for hand placement on RW.   Ambulation/Gait Ambulation/Gait assistance: Min assist Ambulation Distance (Feet): 2 Feet Assistive device: Rolling walker (2 wheeled) Gait Pattern/deviations: Step-to pattern;Shuffle     General Gait Details: Able to take shuffling steps to  bedside chair with RW and minA; slowed movement.    Stairs            Wheelchair Mobility    Modified Rankin (Stroke Patients Only) Modified Rankin (Stroke Patients Only) Pre-Morbid Rankin Score: No symptoms Modified Rankin: Moderately severe disability     Balance Overall balance assessment: Needs assistance Sitting-balance support: No upper extremity supported;Feet supported Sitting balance-Leahy Scale: Fair     Standing balance support: No upper extremity supported;During functional activity Standing balance-Leahy Scale: Poor Standing balance comment: Pt able to perform BLE weight shifts and marching with RW and min guard; reliant on BUE support from RW for balance.                            Cognition Arousal/Alertness: Awake/alert Behavior During Therapy: Anxious (Tearful throughout session) Overall Cognitive Status: Impaired/Different from baseline Area of Impairment: Orientation;Attention;Memory;Following commands                 Orientation Level: Disoriented to;Situation Current Attention Level: Sustained Memory: Decreased short-term memory Following Commands: Follows one step commands inconsistently;Follows one step commands with increased time;Follows multi-step commands inconsistently Safety/Judgement: Decreased awareness of safety;Decreased awareness of deficits Awareness: Intellectual Problem Solving: Requires verbal cues;Slow processing General Comments: Pt emotional and tearful throughout session, stating "something is wrong with me"; disoriented to reason for admission, although explained this per RN and again by therapy. Decreased initiation for all tasks, requiring multimodal cues to redirect. Pt holding letter from daughter at beginning of session, reports he is "trying" to read, but  only able to read "Daddy" after ~5 min; read letter to pt who became more emotional.        Exercises      General Comments        Pertinent  Vitals/Pain Pain Assessment: No/denies pain    Home Living Family/patient expects to be discharged to:: Private residence Living Arrangements: Children (Son) Available Help at Discharge: Available PRN/intermittently;Family (Son works full-time) Type of Home: [employer] Access: Stairs to enter Chiropractor: None Home Layout: Two level;Bed/bath upstairs Home Equipment: None      Prior Function Level of Independence: Independent      Comments: Pt works full-time; drives.    PT Goals (current goals can now be found in the care plan section) Acute Rehab PT Goals Patient Stated Goal: Return home Progress towards PT goals: Progressing toward goals    Frequency    Min 4X/week      PT Plan Current plan remains appropriate    Co-evaluation             End of Session Equipment Utilized During Treatment: Gait belt Activity Tolerance: Patient tolerated treatment well Patient left: with chair alarm set;in chair;with call bell/phone within reach;with nursing/sitter in room Nurse Communication: Mobility status PT Visit Diagnosis: Unsteadiness on feet (R26.81);Other abnormalities of gait and mobility (R26.89);Muscle weakness (generalized) (M62.81)     Time: 6283-6629 PT Time Calculation (min) (ACUTE ONLY): 25 min  Charges:  $Therapeutic Activity: 23-37 mins                    G Codes:       Enis Gash, SPT [phone]  Mabeline Caras 06/23/2016, 4:37 PM

## 2016-06-23 NOTE — Progress Notes (Addendum)
[facility] Attending Note  Presenting HPI:  66 y.o. obese male with severe hypertension and hx of etoh intake nos. Admitted 06/18/2016 with new onset seizures and associated temporal lobe hemorrhage. Chart review shows that even  As of 06/19/16 he developed agitated encephalopathy initially needing haldol and precedex gtt. Daughter also described hx of snorign with pauses concerning for undiagnosed OSA. Also rx with cleviprex of high bp. EEG 3/31 with toxic encephalopaty. On 06/21/16 precedex stopped due to bradycardia HR 40-50s and moved to 15M medical floor. Overnight there severe agitatino with fall out of bed needing bodygaruds and increased ativan. PCCM consulted for take over primary service and and agitation management. Neuro elected to stay on as consultant.  Subjective:  No acute events overnight. No nausea. Patient is thirsty. More appropriate and alert this morning. Continuing on Precedex.  Review of Systems:  No chest pain or pressure. No dyspnea or cough.  Temp:  [98.2 F (36.8 C)-98.6 F (37 C)] 98.2 F (36.8 C) (04/02 0400) Pulse Rate:  [57-84] 57 (04/02 0600) Resp:  [15-21] 19 (04/02 0600) BP: (128-215)/(68-103) 146/69 (04/02 0600) SpO2:  [92 %-99 %] 96 % (04/02 0600)  General:  No family at bedside. No acute distress. Nurse at bedside. Integument:  Warm & dry. No rash on exposed skin. HEENT: Dry mucus memebranes. No scleral icterus.  Neurological:  No meningismus. Oriented to person, place, year, and president.  Musculoskeletal:  No joint effusion or erythema appreciated. Symmetric muscle bulk. Strength grossly 5/5 and symmetric. Pulmonary:  Normal work of breathing on room air. Overall clear to auscultation bilaterally. No accessory muscle use. Cardiovascular:  Regular rhythm. Slightly bradycardic. No appreciable JVD. Normal S1 & S2. Telemetry:  Sinus rhythm. Abdomen:  Soft. Nondistended. Normoactive bowel sounds.  LINES/TUBES: PIV  CBC Latest Ref  Rng & Units 06/23/2016 06/22/2016 06/21/2016  WBC 4.0 - 10.5 K/uL 8.0 9.3 10.2  Hemoglobin 13.0 - 17.0 g/dL 13.0 13.2 14.1  Hematocrit 39.0 - 52.0 % 39.6 40.8 42.8  Platelets 150 - 400 K/uL 162 151 172    BMP Latest Ref Rng & Units 06/23/2016 06/22/2016 06/21/2016  Glucose 65 - 99 mg/dL 106(H) 116(H) 97  BUN 6 - 20 mg/dL 17 16 23(H)  Creatinine 0.61 - 1.24 mg/dL 1.40(H) 1.37(H) 1.59(H)  Sodium 135 - 145 mmol/L 147(H) 142 143  Potassium 3.5 - 5.1 mmol/L 4.2 3.4(L) 3.5  Chloride 101 - 111 mmol/L 116(H) 115(H) 113(H)  CO2 22 - 32 mmol/L 22 20(L) 22  Calcium 8.9 - 10.3 mg/dL 8.6(L) 8.6(L) 8.6(L)    Hepatic Function Latest Ref Rng & Units 06/18/2016 03/04/2008  Total Protein 6.5 - 8.1 g/dL 7.4 7.0  Albumin 3.5 - 5.0 g/dL 4.2 4.0  AST 15 - 41 U/L 27 16  ALT 17 - 63 U/L 18 17  Alk Phosphatase 38 - 126 U/L 69 55  Total Bilirubin 0.3 - 1.2 mg/dL 1.3(H) 1.1  Bilirubin, Direct 0.0 - 0.3 mg/dL - 0.2    IMAGING/STUDIES: CTA HEAD 3/28: 1. Negative CTA of the brain. No acute vascular abnormality identified. No AVM or other vascular abnormality seen underlying the right temporal lobe hemorrhage. 2. Atheromatous plaque involving the carotid siphons with moderate to advanced multifocal narrowing (approximately 50-75%), left greater than right. 3. No significant interval change in size and appearance of right temporal lobe hemorrhage, measuring 2.6 x 1.7 x 1.8 cm. No significant mass effect. CT C-SPINE 3/28:  No evidence of acute cervical spine fracture, traumatic subluxation or static  signs of instability. Multilevel spondylosis, contributing to spinal and foraminal narrowing as described. CAROTID DUPLEX 3/29: - The vertebral arteries appear patent with antegrade flow. - Findings consistent with a high end 1- 39% stenosis involving the proximal right internal carotid artery. - Findings consistent with a high end 60 - 79% stenosis involving the proximal left internal carotid artery. CT HEAD W/O 3/30: 1.  Stable right anterolateral temporal lobe hematoma. 2. Stable advanced chronic microvascular ischemic changes, chronic infarctions, and volume loss of the brain. 3. Stable mildly displaced acute nasal bone fractures. 4. No new acute infarct or hemorrhage identified. TTE 3/30:  LV normal in size with severe concentric hypertrophy. EF 40-45% with diffuse hypokinesis & grade 1 diastolic dysfunction.LA & RA normal in size. RV normal in size and function.Possibly bicuspid aortic valve. No stenosis or regurgitation. Aortic root normal in size. No mitral stenosis or regurgitation. No pulmonic stenosis or regurgitation. Trivial tricuspid regurgitation. No pericardial effusion. EEG 3/31:  This EEG is suggestive of a mild generalized non-specific cerebral dysfunction(encephalopathy). There was no seizure or seizure predisposition recorded on this study. Please note that a normal EEG does not preclude the possibility of epilepsy.    MICROBIOLOGY: MRSA PCR 3/28:  Negative  HIV 3/29: Nonreactive RPR 3/29:  Nonreactive  ANTIBIOTICS: None  SIGNIFICANT EVENTS: 03/28 - Admit to Stroke Service 04/01 - Transfer to ICU w/ alcohol withdrawal  ASSESSMENT/PLAN:  66 y.o. male admitted with right temporal lobe hemorrhage and subsequent seizure with known history of hypertension and alcohol abuse. Mental status gradually improving.  1. Left temporal lobe hemorrhage: Neurology following. Management and neuro checks per neurology. 2. New-onset seizures: Likely secondary to temporal lobe hemorrhage. Neurology following and guiding antiepileptic drugs. Continuing Keppra. 3. Acute encephalopathy: Likely secondary to alcohol withdrawal. Continuing thiamine IV daily. Monitoring closely as we continue treatment of alcohol withdrawal. 4. Acute alcohol withdrawal with delirium: Continuing to wean Precedex infusion. Starting Valium 5 mg by mouth 3 times a day. Continuing Ativan IV scheduled for now. Continuing CIWA  protocol. 5. Essential hypertension: Goal systolic blood pressure as per neurology. Continuing hydralazine IV when necessary. Monitoring vitals per unit protocol.  6. Hyperlipidemia: Continuing Lipitor 20 mg by mouth daily at bedtime. 7. Hypernatremia: Mild. Continuing to trend electrolytes daily. 8. Acute renal failure: Improving. Monitoring urine output. Trending renal function daily with BUN/creatinine. 9. Tobacco use disorder: Continuing nicotine patch.  Prophylaxis:  SCDs. Changing Protonix to PO daily. Diet:  Advancing to clear liquid diet as tolerated. Code Status:  Full CODE STATUS per previous physician discussions. Disposition:  Patient will remain in the ICU as long as we are titrating off his Precedex drip.  Family Update:  Patient updated at bedside.   I have personally spent a total of 33 minutes of critical care time today caring for the patient, updating the patient at bedside, & reviewing the aptient's electronic medical record.  Sonia Baller Ashok Cordia, M.D. [facility] Pager:  [phone] After 3pm or if no response, call [phone] 6:36 AM 06/23/16

## 2016-06-24 DIAGNOSIS — I10 Essential (primary) hypertension: Secondary | ICD-10-CM

## 2016-06-24 LAB — CBC WITH DIFFERENTIAL/PLATELET
BASOS ABS: 0 10*3/uL (ref 0.0–0.1)
Basophils Relative: 0 %
Eosinophils Absolute: 0.3 10*3/uL (ref 0.0–0.7)
Eosinophils Relative: 3 %
HCT: 40.7 % (ref 39.0–52.0)
Hemoglobin: 13.2 g/dL (ref 13.0–17.0)
LYMPHS ABS: 1.4 10*3/uL (ref 0.7–4.0)
Lymphocytes Relative: 14 %
MCH: 31.4 pg (ref 26.0–34.0)
MCHC: 32.4 g/dL (ref 30.0–36.0)
MCV: 96.9 fL (ref 78.0–100.0)
MONO ABS: 0.8 10*3/uL (ref 0.1–1.0)
Monocytes Relative: 9 %
NEUTROS ABS: 7.1 10*3/uL (ref 1.7–7.7)
Neutrophils Relative %: 74 %
Platelets: 187 10*3/uL (ref 150–400)
RBC: 4.2 MIL/uL — AB (ref 4.22–5.81)
RDW: 13.3 % (ref 11.5–15.5)
WBC: 9.6 10*3/uL (ref 4.0–10.5)

## 2016-06-24 LAB — RENAL FUNCTION PANEL
ALBUMIN: 3.4 g/dL — AB (ref 3.5–5.0)
ANION GAP: 4 — AB (ref 5–15)
BUN: 21 mg/dL — ABNORMAL HIGH (ref 6–20)
CO2: 23 mmol/L (ref 22–32)
Calcium: 8.6 mg/dL — ABNORMAL LOW (ref 8.9–10.3)
Chloride: 116 mmol/L — ABNORMAL HIGH (ref 101–111)
Creatinine, Ser: 1.37 mg/dL — ABNORMAL HIGH (ref 0.61–1.24)
GFR calc Af Amer: >60 mL/min (ref >60)
GFR, EST NON AFRICAN AMERICAN: 52 mL/min — AB (ref >60)
Glucose, Bld: 111 mg/dL — ABNORMAL HIGH (ref 65–99)
PHOSPHORUS: 2.5 mg/dL (ref 2.5–4.6)
POTASSIUM: 3.7 mmol/L (ref 3.5–5.1)
Sodium: 143 mmol/L (ref 135–145)

## 2016-06-24 LAB — MAGNESIUM: Magnesium: 2 mg/dL (ref 1.7–2.4)

## 2016-06-24 MED ORDER — LABETALOL HCL 5 MG/ML IV SOLN
10.0000 mg | INTRAVENOUS | Status: DC | PRN
  Administered 2016-06-24 – 2016-06-27 (×2): 10 mg via INTRAVENOUS
  Filled 2016-06-24 (×2): qty 4

## 2016-06-24 MED ORDER — VITAMIN B-1 100 MG PO TABS
100.0000 mg | ORAL_TABLET | Freq: Every day | ORAL | Status: DC
  Administered 2016-06-24 – 2016-06-28 (×5): 100 mg via ORAL
  Filled 2016-06-24 (×5): qty 1

## 2016-06-24 MED ORDER — LABETALOL HCL 5 MG/ML IV SOLN
INTRAVENOUS | Status: AC
  Filled 2016-06-24: qty 4

## 2016-06-24 MED ORDER — AMLODIPINE BESYLATE 5 MG PO TABS
5.0000 mg | ORAL_TABLET | Freq: Every day | ORAL | Status: DC
  Administered 2016-06-24 – 2016-06-25 (×2): 5 mg via ORAL
  Filled 2016-06-24 (×2): qty 1

## 2016-06-24 MED ORDER — FOLIC ACID 1 MG PO TABS
1.0000 mg | ORAL_TABLET | Freq: Every day | ORAL | Status: DC
  Administered 2016-06-24 – 2016-06-28 (×5): 1 mg via ORAL
  Filled 2016-06-24 (×5): qty 1

## 2016-06-24 MED ORDER — LABETALOL HCL 5 MG/ML IV SOLN
20.0000 mg | INTRAVENOUS | Status: DC | PRN
  Administered 2016-06-24: 20 mg via INTRAVENOUS
  Filled 2016-06-24: qty 4

## 2016-06-24 NOTE — Progress Notes (Signed)
STROKE TEAM PROGRESS NOTE   SUBJECTIVE (INTERVAL HISTORY) Patient   seems calm and cooperative. Repeat CT scan of the head y`day showed stable appearance of hematoma OBJECTIVE  Recent Labs Lab 06/18/16 1522  GLUCAP 173*    Recent Labs Lab 06/20/16 0631 06/21/16 0322 06/22/16 1232 06/23/16 0501 06/24/16 0320  NA 140 143 142 147* 143  K 3.4* 3.5 3.4* 4.2 3.7  CL 107 113* 115* 116* 116*  CO2 24 22 20* 22 23  GLUCOSE 101* 97 116* 106* 111*  BUN 22* 23* 16 17 21*  CREATININE 1.82* 1.59* 1.37* 1.40* 1.37*  CALCIUM 8.7* 8.6* 8.6* 8.6* 8.6*  MG  --   --  1.8 2.1 2.0  PHOS  --   --  3.4 4.2 2.5    Recent Labs Lab 06/18/16 1530 06/24/16 0320  AST 27  --   ALT 18  --   ALKPHOS 69  --   BILITOT 1.3*  --   PROT 7.4  --   ALBUMIN 4.2 3.4*    Recent Labs Lab 06/20/16 0631 06/21/16 0322 06/22/16 1232 06/23/16 0501 06/24/16 0320  WBC 8.0 10.2 9.3 8.0 9.6  NEUTROABS  --   --   --  6.2 7.1  HGB 13.6 14.1 13.2 13.0 13.2  HCT 41.5 42.8 40.8 39.6 40.7  MCV 95.8 96.2 95.6 96.8 96.9  PLT 169 172 151 162 187   No results for input(s): LABPROT, INR in the last 72 hours. No results for input(s): COLORURINE, LABSPEC, [place], GLUCOSEU, HGBUR, BILIRUBINUR, KETONESUR, PROTEINUR, UROBILINOGEN, NITRITE, LEUKOCYTESUR in the last 72 hours.  Invalid input(s): APPERANCEUR      Component Value Date/Time   LABOPIA NONE DETECTED 06/18/2016 1743   COCAINSCRNUR NONE DETECTED 06/18/2016 1743   LABBENZ POSITIVE (A) 06/18/2016 1743   AMPHETMU NONE DETECTED 06/18/2016 1743   THCU NONE DETECTED 06/18/2016 1743   LABBARB NONE DETECTED 06/18/2016 1743     Recent Labs Lab 06/18/16 1530  ETH <5    Imaging  Ct Angio Head W Or Wo Contrast 06/18/2016 1. Negative CTA of the brain. No acute vascular abnormality identified. No AVM or other vascular abnormality seen underlying the right temporal lobe hemorrhage.  2. Atheromatous plaque involving the carotid siphons with moderate to advanced  multifocal narrowing (approximately 50-75%), left greater than right.  3. No significant interval change in size and appearance of right temporal lobe hemorrhage, measuring 2.6 x 1.7 x 1.8 cm. No significant mass effect.      Ct Head Wo Contrast 06/20/2016 1. Stable right anterolateral temporal lobe hematoma.  2. Stable advanced chronic microvascular ischemic changes, chronic infarctions, and volume loss of the brain.  3. Stable mildly displaced acute nasal bone fractures.  4. No new acute infarct or hemorrhage identified.    Ct Head Wo Contrast 06/19/2016 1. Stable acute hemorrhage within the right lateral temporal lobe. This area is atypical for hypertensive hemorrhage and traumatic hemorrhagic contusion or underlying hemorrhagic mass should be considered.  2. Stable advanced chronic microvascular ischemic changes of the brain, chronic infarcts, and volume loss.  3. Acute appearing mildly displaced nasal bone fractures and chronic left lamina papyracea fracture are stable.     Ct Head Wo Contrast 06/18/2016 Small area of acute hemorrhage in the right lateral temporal lobe. Hemorrhage volume 3.3 mL.  No significant surrounding edema or mass lesion.  Differential considerations would include a traumatic hemorrhagic contusion. Venous infarction is possible.  Hemorrhagic mass or infarct are consider less likely. Further evaluation with  MRI and CTA suggested.  Generalized atrophy with diffuse white matter disease most likely chronic.  Hypodensity in the right parietal lobe most likely a chronic infarct.     Ct Cervical Spine Wo Contrast 06/18/2016 No evidence of acute cervical spine fracture, traumatic subluxation or static signs of instability. Multilevel spondylosis, contributing to spinal and foraminal narrowing as described.    Carotid Doppler: Preliminary findings: Consistent with a high end 60 - 79 percent stenosis involving the proximal left internal carotid artery. Findings  consistent with a high end 1- 39 percent stenosis involving the proximal right internal carotid artery. Intimal thickening vs soft plaque bilaterally.  Bilateral vertebral arteries are patent and antegrade.    PHYSICAL EXAM Vitals:   06/24/16 0930 06/24/16 0942 06/24/16 1000 06/24/16 1030  BP: (!) 171/77 (!) 171/77 (!) 164/80 (!) 161/76  Pulse: 82  79 73  Resp: 19  20 (!) 21  Temp:      TempSrc:      SpO2: 94%  97% 95%  Weight:      Height:       General: Vital signs reviewed.  Patient is well-developed and well-nourished, in no acute distress and    Cardiovascular: Bradycardic, regular rhythm, no murmurs, gallops, or rubs. Pulmonary/Chest: Clear to auscultation bilaterally, no wheezes, rales, or rhonchi. Abdominal: Soft, non-tender, non-distended, BS + Neurological Exam  :  Drowsy but opens eyes and follows commands. Diminished attention, registration and recall. Normal facial apraxia or dysarthria. Follows two-step commands. Diminished recall. Extraocular movements are pharyngeal or nystagmus. Blinks to threat bilaterally. Fundi were not visualized. Face is symmetric without weakness. Not cooperative for detailed visual field testing but suspect left superior quadrant vision field loss. Face is symmetric. Tongue is midline. Motor system exam no upper or lower extremity drift. Symmetric and equal strength in all 4 extremities. No focal weakness. Deep tendon reflexes are symmetric plantars are downgoing. Gait not tested.   ASSESSMENT/PLAN Mr. DAMEN WINDSOR is a 66 y.o. male with history of hypertension, alcohol abuse admitted for a seizure and found to have an intracranial temporal lobe hemorrhage.   Right Temporal lobe intraparenchymal hematoma: Unclear if the hematoma is secondary to fall, tumor, aneurysm or AVM.   CT head: Small area of acute hemorrhage in the right lateral temporal lobe. Hemorrhage volume 3.3 mL. No significant surrounding edema or mass lesion. Hypodensity in the  right parietal lobe most likely a chronic infarct.  CTA Brain: Negative CTA of the brain. No AVM or other vascular abnormality seen underlying the right temporal lobe hemorrhage. Atheromatous plaque involving the carotid siphons with moderate to advanced multifocal narrowing (approximately 50-75%), left greater than right.   Repeat CT Head 3/30: Stable right anterolateral temporal lobe hematoma. Stable advanced chronic microvascular ischemic changes, chronic infarctions, and volume loss of the brain.  MRI - pending   Carotid Doppler: Consistent with a high end 60 - 79 percent stenosis involving the proximal Lt ICA.   Findings consistent with a high end 1- 39 percent stenosis involving the proximal right internal carotid artery. Intimal thickening vs soft plaque bilaterally.  Bilateral vertebral arteries are patent and antegrade.  2D Echo - EF 40-45%. No cardiac source of emboli identified.  LDL 123  HgbA1c 5.6  SCDs for VTE prophylaxis given ICH  Diet Heart Room service appropriate? Yes; Fluid consistency: Thin   No antithrombotic prior to admission, now on No antithrombotic  Ongoing aggressive stroke risk factor management  Therapy recommendations:  CIR  Disposition:  Pending  Seizure:  On Keppra 500 mg BID  EEG - mild gen slowing. No seizures  No history of seizures or recurrent seizures   Alcohol level on admission <5, UDS positive for benzos, UA negative, TSH normal, HIV and RPR non reactive  Alcohol Withdrawal  Patient became very agitated yesterday requiring haldol and precedex gtt  Heavy alcohol use at home, today patient agitated and uncooperative  precedex held due to bradycardia  CIWA - transfer back to the ICU. Critical care to manage withdrawal.   Blood pressure significantly elevated in agitated state.  Diabetes  HgbA1c 5.6 goal < 7.0  CBG monitoring   Hypertension  Home meds: None  history of uncontrolled HTN, but not taking any medications  at home  Maintain BP 545-625W systolic-     Hyperlipidemia  Home meds: None  Currently on atorvastatin 20 mg QD  LDL 123, goal < 70  Continue statin at discharge  Other Stroke Risk Factors  Cigarette smoker, advised to stop smoking  ETOH use  Obesity, Body mass index is 28.47 kg/m.   Other Active Problems  CKD III:  Creatinine 1.5-1.8  Alcohol Abuse: Drinks 8 beers per day. On CIWA, Precedex   Tobacco abuse- Nicotine patch  Hospital day # 6    I have personally examined this patient, reviewed notes, independently viewed imaging studies, participated in medical decision making and plan of care.ROS completed by me personally and pertinent positives fully documented  I have made any additions or clarifications directly to the above note.   D/W Dr Ashok Cordia.No family at bedside with [place] for seizures and continue ongoing management for alcohol withdrawal as per CCM team. Likely transfer to the floor the next 1-2 days.. This patient is critically ill and at significant risk of neurological worsening, death and care requires constant monitoring of vital signs, hemodynamics,respiratory and cardiac monitoring, extensive review of multiple databases, frequent neurological assessment, discussion with family, other specialists and medical decision making of high complexity.I have made any additions or clarifications directly to the above note.This critical care time does not reflect procedure time, or teaching time or supervisory time of PA/NP/Med Resident etc but could involve care discussion time.  I spent 30 minutes of neurocritical care time  in the care of  this patient.     Antony Contras, MD Medical Director [place] Pager: [phone] 06/24/2016 11:26 AM    To contact Stroke Continuity provider, please refer to [email or web address] After hours, contact General Neurology

## 2016-06-24 NOTE — Progress Notes (Signed)
PT transferred to 5M11 from 3MW. Safety precautions and orders reviewed with patient. VSS. TELE applied and confirmed. No other distress noted. Will cont to monitor,   Ave Filter, RN

## 2016-06-24 NOTE — Progress Notes (Addendum)
Pt noted confused, getting ready and wanting to go home. Staffs at bedside. Pt removed TELE. Critical MD paged and aware. Schedule valium given.    Ave Filter, RN

## 2016-06-24 NOTE — Progress Notes (Signed)
qPhysical Therapy Treatment Patient Details Name: Gabriel Romero MRN: [id number] DOB: 1950-06-05 Today's Date: 06/24/2016    History of Present Illness Pt is a 66 y.o. male admitted to ED on 06/18/16 with new onset seizures after a fall. CT shows acute hemorrhage in R lateral temporal lobe; C-spine clear. Pertinent PMH includes severe HTN, alcohol use.      PT Comments    Pt demonstrating improved orientation today and remains motivated to work with PT. Continues to require intermittent verbal cues to redirect to task. Able to stand with RW and min guard for balance; further mobility limited by increased BP (RN aware). Will continue to work with acutely.  Resting BP: 167/110 (seated) Standing BP: 190/87 2-min sitting BP: 179/80 5-min post-IV BP med administration: 219/93    Follow Up Recommendations  CIR;Supervision/Assistance - 24 hour     Equipment Recommendations  Other (comment) (Defer to next venue)    Recommendations for Other Services       Precautions / Restrictions Precautions Precautions: Fall Precaution Comments: SBP 120-160 Restrictions Weight Bearing Restrictions: No    Mobility  Bed Mobility               General bed mobility comments: Received sitting in chair  Transfers Overall transfer level: Needs assistance Equipment used: Rolling walker (2 wheeled) Transfers: Sit to/from Stand Sit to Stand: Min guard         General transfer comment: Pt able to stand on 4th attempt after cued to push from chair arm rest instead of holding onto RW; min guard for balance. BP 190//87 with standing.  Ambulation/Gait             General Gait Details: Deferred secondary to increased BP.    Stairs            Wheelchair Mobility    Modified Rankin (Stroke Patients Only) Modified Rankin (Stroke Patients Only) Pre-Morbid Rankin Score: No symptoms Modified Rankin: Moderately severe disability     Balance Overall balance assessment: Needs  assistance Sitting-balance support: No upper extremity supported;Feet supported Sitting balance-Leahy Scale: Fair     Standing balance support: Bilateral upper extremity supported Standing balance-Leahy Scale: Poor Standing balance comment: Reliant on BUE support from RW.                             Cognition Arousal/Alertness: Awake/alert Behavior During Therapy: WFL for tasks assessed/performed Overall Cognitive Status: Impaired/Different from baseline Area of Impairment: Attention;Memory;Following commands;Safety/judgement;Awareness;Problem solving                   Current Attention Level: Sustained (Progressing towards selective) Memory: Decreased short-term memory Following Commands: Follows one step commands with increased time;Follows multi-step commands inconsistently Safety/Judgement: Decreased awareness of safety;Decreased awareness of deficits Awareness: Emergent Problem Solving: Requires verbal cues;Slow processing General Comments: Pt demonstrates improved orientation today, able to explain details regarding fall. Continues to have some impulsivity with movement, but demonstrating an overall improvement in safety awareness, stating he does not want to have another fall; educ on not getting up without nurse or therapy present. Intermittent tearfulness when discussing situation and children.       Exercises General Exercises - Upper Extremity Elbow Flexion: AROM;Both;20 reps Elbow Extension: AROM;Both;20 reps General Exercises - Lower Extremity Long Arc Quad: Strengthening;Both;15 reps Hip Flexion/Marching: Strengthening;Both;15 reps    General Comments        Pertinent Vitals/Pain Pain Assessment: No/denies pain    Home  Living                      Prior Function            PT Goals (current goals can now be found in the care plan section) Progress towards PT goals: Progressing toward goals    Frequency    Min  4X/week      PT Plan Current plan remains appropriate    Co-evaluation             End of Session Equipment Utilized During Treatment: Gait belt Activity Tolerance: Patient tolerated treatment well Patient left: with chair alarm set;in chair;with call bell/phone within reach Nurse Communication: Mobility status PT Visit Diagnosis: Unsteadiness on feet (R26.81);Other abnormalities of gait and mobility (R26.89);Muscle weakness (generalized) (M62.81)     Time: 4259-5638 PT Time Calculation (min) (ACUTE ONLY): 28 min  Charges:  $Therapeutic Exercise: 8-22 mins $Therapeutic Activity: 8-22 mins                    G Codes:      Enis Gash, SPT [phone]  Mabeline Caras 06/24/2016, 12:08 PM

## 2016-06-24 NOTE — Progress Notes (Signed)
SLP Cancellation Note  Patient Details Name: Gabriel Romero MRN: [id number] DOB: [birth year]   Cancelled treatment:       Reason Eval/Treat Not Completed: Other (comment) Attempted to see pt this afternoon - he is currently wanting to leave the hospital and has several staff members in the room to talk with him. Will return as able.   [name] 06/24/2016, 2:57 PM  [name], M.A. CCC-SLP [phone]

## 2016-06-24 NOTE — Progress Notes (Signed)
[place] [facility] Attending Note  Presenting HPI:  66 y.o. obese male with severe hypertension and hx of etoh intake nos. Admitted 06/18/2016 with new onset seizures and associated temporal lobe hemorrhage. Chart review shows that even  As of 06/19/16 he developed agitated encephalopathy initially needing haldol and precedex gtt. Daughter also described hx of snorign with pauses concerning for undiagnosed OSA. Also rx with cleviprex of high bp. EEG 3/31 with toxic encephalopaty. On 06/21/16 precedex stopped due to bradycardia HR 40-50s and moved to 22M medical floor. Overnight there severe agitatino with fall out of bed needing bodygaruds and increased ativan. PCCM consulted for take over primary service and and agitation management. Neuro elected to stay on as consultant.  Subjective:  Patient weaned off Precedex infusion yesterday. Reports some back discomfort. Otherwise no new joint pain or swelling. Reports no dyspnea or cough.  Review of Systems:  No abdominal pain or nausea. Tolerating diet. No subjective fever or chills. No headache or vision changes.  Temp:  [98 F (36.7 C)-99 F (37.2 C)] 98.9 F (37.2 C) (04/03 0400) Pulse Rate:  [46-100] 67 (04/03 0700) Resp:  [12-30] 21 (04/03 0830) BP: (144-195)/(68-121) 177/92 (04/03 0830) SpO2:  [90 %-100 %] 100 % (04/03 0800)  Gen.: Sitting up in chair at bedside. No distress. Nurse at bedside. Integument: No rash or bruising on exposed skin. Warm and dry. Neurological: Oriented to person, place, year, and president. No meningismus. Moving all 4 extremities equally. Pulmonary: Normal work of breathing on room air. Clear with auscultation. Cardiovascular: Regular rhythm. No edema or JVD appreciated with body positioning. HEENT: Moist mucous membranes. No scleral icterus. Pupils symmetric.  LINES/TUBES: PIV  CBC Latest Ref Rng & Units 06/24/2016 06/23/2016 06/22/2016  WBC 4.0 - 10.5 K/uL 9.6 8.0 9.3  Hemoglobin 13.0 - 17.0 g/dL 13.2  13.0 13.2  Hematocrit 39.0 - 52.0 % 40.7 39.6 40.8  Platelets 150 - 400 K/uL 187 162 151    BMP Latest Ref Rng & Units 06/24/2016 06/23/2016 06/22/2016  Glucose 65 - 99 mg/dL 111(H) 106(H) 116(H)  BUN 6 - 20 mg/dL 21(H) 17 16  Creatinine 0.61 - 1.24 mg/dL 1.37(H) 1.40(H) 1.37(H)  Sodium 135 - 145 mmol/L 143 147(H) 142  Potassium 3.5 - 5.1 mmol/L 3.7 4.2 3.4(L)  Chloride 101 - 111 mmol/L 116(H) 116(H) 115(H)  CO2 22 - 32 mmol/L 23 22 20(L)  Calcium 8.9 - 10.3 mg/dL 8.6(L) 8.6(L) 8.6(L)    Hepatic Function Latest Ref Rng & Units 06/24/2016 06/18/2016 03/04/2008  Total Protein 6.5 - 8.1 g/dL - 7.4 7.0  Albumin 3.5 - 5.0 g/dL 3.4(L) 4.2 4.0  AST 15 - 41 U/L - 27 16  ALT 17 - 63 U/L - 18 17  Alk Phosphatase 38 - 126 U/L - 69 55  Total Bilirubin 0.3 - 1.2 mg/dL - 1.3(H) 1.1  Bilirubin, Direct 0.0 - 0.3 mg/dL - - 0.2    IMAGING/STUDIES: CTA HEAD 3/28: 1. Negative CTA of the brain. No acute vascular abnormality identified. No AVM or other vascular abnormality seen underlying the right temporal lobe hemorrhage. 2. Atheromatous plaque involving the carotid siphons with moderate to advanced multifocal narrowing (approximately 50-75%), left greater than right. 3. No significant interval change in size and appearance of right temporal lobe hemorrhage, measuring 2.6 x 1.7 x 1.8 cm. No significant mass effect. CT C-SPINE 3/28:  No evidence of acute cervical spine fracture, traumatic subluxation or static signs of instability. Multilevel spondylosis, contributing to spinal and foraminal narrowing  as described. CAROTID DUPLEX 3/29: - The vertebral arteries appear patent with antegrade flow. - Findings consistent with a high end 1- 39% stenosis involving the proximal right internal carotid artery. - Findings consistent with a high end 60 - 79% stenosis involving the proximal left internal carotid artery. CT HEAD W/O 3/30: 1. Stable right anterolateral temporal lobe hematoma. 2. Stable advanced chronic  microvascular ischemic changes, chronic infarctions, and volume loss of the brain. 3. Stable mildly displaced acute nasal bone fractures. 4. No new acute infarct or hemorrhage identified. TTE 3/30:  LV normal in size with severe concentric hypertrophy. EF 40-45% with diffuse hypokinesis & grade 1 diastolic dysfunction.LA & RA normal in size. RV normal in size and function.Possibly bicuspid aortic valve. No stenosis or regurgitation. Aortic root normal in size. No mitral stenosis or regurgitation. No pulmonic stenosis or regurgitation. Trivial tricuspid regurgitation. No pericardial effusion. EEG 3/31:  This EEG is suggestive of a mild generalized non-specific cerebral dysfunction(encephalopathy). There was no seizure or seizure predisposition recorded on this study. Please note that a normal EEG does not preclude the possibility of epilepsy.  CT HEAD W/O 4/2: 1. Unchanged size of anterior right temporal lobe intraparenchymal hematoma without new area of hemorrhage or mass effect. 2. Advanced chronic microvascular ischemia and old infarcts. EKG 4/3:  Personally reviewed. QTc 475 ms w/ some artifact. Otherwise sinus rhythm.    MICROBIOLOGY: MRSA PCR 3/28:  Negative  HIV 3/29: Nonreactive RPR 3/29:  Nonreactive  ANTIBIOTICS: None  SIGNIFICANT EVENTS: 03/28 - Admit to Stroke Service 04/01 - Transfer to ICU w/ alcohol withdrawal 04/02 - Weaned off Precedex drip  ASSESSMENT/PLAN:  66 y.o. right hemorrhage and subsequent seizure known history of hypertension and alcohol use. Patient seems to be stabilizing from his alcohol withdrawal. Mental status is appropriate at this time. Essential hypertension is the main issue we're dealing with. Patient denies any prior antihypertensive medications.   1. Left temporal lobe hemorrhage: Neurology following. Management and neurochecks per neurology. 2. 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 Patient continuing on Keppra as per  neurology recommendations. 3. Acute encephalopathy: Secondary to alcohol withdrawal. Resolved. Continuing oral thiamine. Monitoring closely for further signs of alcohol withdrawal.  4. Acute Alcohol withdrawal with delirium: Discontinuing scheduled IV lorazepam. Continuing CIWA protocol. Continuing Valium 5 mg by mouth every 8 hours.  5. Essential hypertension:  Continuing to monitor vitals per unit protocol. Starting Norvasc '5mg'$  po daily. Continuing Labetalol & Hydralazine IV as needed to maintain blood pressure. 6. Hyperlipidemia: Continuing Lipitor daily at bedtime. 7. Hyponatremia: Resolved. 8. Acute renal failure: Resolved 9. Tobacco use disorder: Continuing nicotine patch daily.  I have spent a total of 37 minutes of time today caring for the patient, reviewing the patient's electronic medical record, and with more than 50% of that time spent coordinating transfer of care with the patient as well as reviewing the continuing plan of care with the patient and Dr. Leonie Man at bedside.  Sonia Baller Ashok Cordia, M.D. [facility] Pager:  [phone] After 3pm or if no response, call [phone] 8:35 AM 06/24/16

## 2016-06-24 NOTE — Progress Notes (Signed)
[facility] Progress Note Patient Name: KENG JEWEL DOB: 1950/09/05 MRN: [id number]   Date of Service  06/24/2016  HPI/Events of Note  hypertensive  eICU Interventions  Prn labetalol     Intervention Category Major Interventions: Hypertension - evaluation and management  Simonne Maffucci 06/24/2016, 6:15 AM

## 2016-06-24 NOTE — Progress Notes (Signed)
Inpatient Rehabilitation  Continuing to follow along for improved BP and insurance authorization prior to hopeful IP Rehab admission.  Plan for my co-worker Karene Fry, [phone] to follow-up in my absence over the next week.  Please call her with questions.    Carmelia Roller., CCC/SLP Admission Coordinator  [place]  Cell [phone]

## 2016-06-25 ENCOUNTER — Encounter (HOSPITAL_COMMUNITY): Payer: Self-pay | Admitting: *Deleted

## 2016-06-25 LAB — RENAL FUNCTION PANEL
ANION GAP: 6 (ref 5–15)
Albumin: 3 g/dL — ABNORMAL LOW (ref 3.5–5.0)
BUN: 18 mg/dL (ref 6–20)
CALCIUM: 8.5 mg/dL — AB (ref 8.9–10.3)
CO2: 22 mmol/L (ref 22–32)
Chloride: 116 mmol/L — ABNORMAL HIGH (ref 101–111)
Creatinine, Ser: 1.35 mg/dL — ABNORMAL HIGH (ref 0.61–1.24)
GFR, EST NON AFRICAN AMERICAN: 53 mL/min — AB (ref >60)
Glucose, Bld: 108 mg/dL — ABNORMAL HIGH (ref 65–99)
PHOSPHORUS: 3.6 mg/dL (ref 2.5–4.6)
Potassium: 3.5 mmol/L (ref 3.5–5.1)
SODIUM: 144 mmol/L (ref 135–145)

## 2016-06-25 LAB — CBC WITH DIFFERENTIAL/PLATELET
BASOS PCT: 0 %
Basophils Absolute: 0 10*3/uL (ref 0.0–0.1)
Eosinophils Absolute: 0.3 10*3/uL (ref 0.0–0.7)
Eosinophils Relative: 4 %
HEMATOCRIT: 37.3 % — AB (ref 39.0–52.0)
HEMOGLOBIN: 12 g/dL — AB (ref 13.0–17.0)
Lymphocytes Relative: 20 %
Lymphs Abs: 1.6 10*3/uL (ref 0.7–4.0)
MCH: 30.9 pg (ref 26.0–34.0)
MCHC: 32.2 g/dL (ref 30.0–36.0)
MCV: 96.1 fL (ref 78.0–100.0)
MONOS PCT: 9 %
Monocytes Absolute: 0.7 10*3/uL (ref 0.1–1.0)
NEUTROS ABS: 5.3 10*3/uL (ref 1.7–7.7)
NEUTROS PCT: 67 %
Platelets: 174 10*3/uL (ref 150–400)
RBC: 3.88 MIL/uL — ABNORMAL LOW (ref 4.22–5.81)
RDW: 13.6 % (ref 11.5–15.5)
WBC: 7.9 10*3/uL (ref 4.0–10.5)

## 2016-06-25 LAB — MAGNESIUM: Magnesium: 1.8 mg/dL (ref 1.7–2.4)

## 2016-06-25 MED ORDER — LEVETIRACETAM 500 MG PO TABS
500.0000 mg | ORAL_TABLET | Freq: Two times a day (BID) | ORAL | Status: DC
  Administered 2016-06-25 – 2016-06-28 (×6): 500 mg via ORAL
  Filled 2016-06-25 (×7): qty 1

## 2016-06-25 MED ORDER — AMLODIPINE BESYLATE 10 MG PO TABS
10.0000 mg | ORAL_TABLET | Freq: Every day | ORAL | Status: DC
  Administered 2016-06-26 – 2016-06-28 (×3): 10 mg via ORAL
  Filled 2016-06-25 (×3): qty 1

## 2016-06-25 MED ORDER — NICOTINE 7 MG/24HR TD PT24
7.0000 mg | MEDICATED_PATCH | Freq: Every day | TRANSDERMAL | Status: DC
  Administered 2016-06-26 – 2016-06-28 (×3): 7 mg via TRANSDERMAL
  Filled 2016-06-25 (×3): qty 1

## 2016-06-25 NOTE — Progress Notes (Signed)
[place] [facility] Attending Note  Presenting HPI:  66 y.o. obese male with severe hypertension and hx of etoh intake nos. Admitted 06/18/2016 with new onset seizures and associated temporal lobe hemorrhage. Chart review shows that even  As of 06/19/16 he developed agitated encephalopathy initially needing haldol and precedex gtt. Daughter also described hx of snorign with pauses concerning for undiagnosed OSA. Also rx with cleviprex of high bp. EEG 3/31 with toxic encephalopaty. On 06/21/16 precedex stopped due to bradycardia HR 40-50s and moved to 82M medical floor. Overnight there severe agitatino with fall out of bed needing bodygaruds and increased ativan. PCCM consulted for take over primary service and and agitation management. Neuro elected to stay on as consultant.  Subjective:  Appears comfortable. BP up but my be residual from ETOH wd.  Review of Systems:  No abdominal pain or nausea. Tolerating diet. No subjective fever or chills. No headache or vision changes.  Temp:  [98.2 F (36.8 C)-98.6 F (37 C)] 98.5 F (36.9 C) (04/04 1005) Pulse Rate:  [65-80] 80 (04/04 1005) Resp:  [18-21] 19 (04/04 1005) BP: (137-178)/(63-93) 172/75 (04/04 1005) SpO2:  [93 %-99 %] 95 % (04/04 1005)  Gen.: Sitting up in chair at bedside. No distress. Alert and follows commands Integument: No rash or bruising on exposed skin. Warm and dry. Neurological: Oriented to person, place, year, and president. No meningismus. Moving all 4 extremities equally. Pulmonary: Normal work of breathing on room air. Clear with auscultation. Off o2 Cardiovascular: Regular rhythm. No edema or JVD appreciated with body positioning. HEENT: Moist mucous membranes. No scleral icterus. Pupils symmetric.  LINES/TUBES: PIV  CBC Latest Ref Rng & Units 06/25/2016 06/24/2016 06/23/2016  WBC 4.0 - 10.5 K/uL 7.9 9.6 8.0  Hemoglobin 13.0 - 17.0 g/dL 12.0(L) 13.2 13.0  Hematocrit 39.0 - 52.0 % 37.3(L) 40.7 39.6  Platelets 150 -  400 K/uL 174 187 162    BMP Latest Ref Rng & Units 06/25/2016 06/24/2016 06/23/2016  Glucose 65 - 99 mg/dL 108(H) 111(H) 106(H)  BUN 6 - 20 mg/dL 18 21(H) 17  Creatinine 0.61 - 1.24 mg/dL 1.35(H) 1.37(H) 1.40(H)  Sodium 135 - 145 mmol/L 144 143 147(H)  Potassium 3.5 - 5.1 mmol/L 3.5 3.7 4.2  Chloride 101 - 111 mmol/L 116(H) 116(H) 116(H)  CO2 22 - 32 mmol/L '22 23 22  '$ Calcium 8.9 - 10.3 mg/dL 8.5(L) 8.6(L) 8.6(L)    Hepatic Function Latest Ref Rng & Units 06/25/2016 06/24/2016 06/18/2016  Total Protein 6.5 - 8.1 g/dL - - 7.4  Albumin 3.5 - 5.0 g/dL 3.0(L) 3.4(L) 4.2  AST 15 - 41 U/L - - 27  ALT 17 - 63 U/L - - 18  Alk Phosphatase 38 - 126 U/L - - 69  Total Bilirubin 0.3 - 1.2 mg/dL - - 1.3(H)  Bilirubin, Direct 0.0 - 0.3 mg/dL - - -    IMAGING/STUDIES: CTA HEAD 3/28: 1. Negative CTA of the brain. No acute vascular abnormality identified. No AVM or other vascular abnormality seen underlying the right temporal lobe hemorrhage. 2. Atheromatous plaque involving the carotid siphons with moderate to advanced multifocal narrowing (approximately 50-75%), left greater than right. 3. No significant interval change in size and appearance of right temporal lobe hemorrhage, measuring 2.6 x 1.7 x 1.8 cm. No significant mass effect. CT C-SPINE 3/28:  No evidence of acute cervical spine fracture, traumatic subluxation or static signs of instability. Multilevel spondylosis, contributing to spinal and foraminal narrowing as described. CAROTID DUPLEX 3/29: - The vertebral  arteries appear patent with antegrade flow. - Findings consistent with a high end 1- 39% stenosis involving the proximal right internal carotid artery. - Findings consistent with a high end 60 - 79% stenosis involving the proximal left internal carotid artery. CT HEAD W/O 3/30: 1. Stable right anterolateral temporal lobe hematoma. 2. Stable advanced chronic microvascular ischemic changes, chronic infarctions, and volume loss of the brain. 3.  Stable mildly displaced acute nasal bone fractures. 4. No new acute infarct or hemorrhage identified. TTE 3/30:  LV normal in size with severe concentric hypertrophy. EF 40-45% with diffuse hypokinesis & grade 1 diastolic dysfunction.LA & RA normal in size. RV normal in size and function.Possibly bicuspid aortic valve. No stenosis or regurgitation. Aortic root normal in size. No mitral stenosis or regurgitation. No pulmonic stenosis or regurgitation. Trivial tricuspid regurgitation. No pericardial effusion. EEG 3/31:  This EEG is suggestive of a mild generalized non-specific cerebral dysfunction(encephalopathy). There was no seizure or seizure predisposition recorded on this study. Please note that a normal EEG does not preclude the possibility of epilepsy.  CT HEAD W/O 4/2: 1. Unchanged size of anterior right temporal lobe intraparenchymal hematoma without new area of hemorrhage or mass effect. 2. Advanced chronic microvascular ischemia and old infarcts. EKG 4/3:  Personally reviewed. QTc 475 ms w/ some artifact. Otherwise sinus rhythm.    MICROBIOLOGY: MRSA PCR 3/28:  Negative  HIV 3/29: Nonreactive RPR 3/29:  Nonreactive  ANTIBIOTICS: None  SIGNIFICANT EVENTS: 03/28 - Admit to Stroke Service 04/01 - Transfer to ICU w/ alcohol withdrawal 04/02 - Weaned off Precedex drip 4/4 on floor. Less confused. SBP 170's, Norvasc increased.   ASSESSMENT/PLAN:  66 y.o. right hemorrhage and subsequent seizure known history of hypertension and alcohol use. Patient seems to be stabilizing from his alcohol withdrawal. Mental status is appropriate at this time. Essential hypertension is the main issue we're dealing with. Patient denies any prior antihypertensive medications.   1. Left temporal lobe hemorrhage: Neurology following. Management and neurochecks per neurology. 2. 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 Patient continuing on Keppra as per neurology  recommendations. 3. Acute encephalopathy: Secondary to alcohol withdrawal. Resolved. Continuing oral thiamine. Monitoring closely for further signs of alcohol withdrawal. Improved 4/4  4. Acute Alcohol withdrawal with delirium: Discontinuing scheduled IV lorazepam. Continuing CIWA protocol. Continuing Valium 5 mg by mouth every 8 hours. Less confused 4//4 5. Essential hypertension:  Continuing to monitor vitals per unit protocol. Starting Norvasc '5mg'$  po daily will increase to 10 mg 4/4 . Continuing Labetalol & Hydralazine IV as needed to maintain blood pressure but would want to move away from IV meds to allow tx to rehab. 6. Hyperlipidemia: Continuing Lipitor daily at bedtime. 7. Hyponatremia: Resolved. 8. Acute renal failure: Resolved 9. Tobacco use disorder: Continuing nicotine patch daily.  Richardson Landry Naiomi Musto ACNP Maryanna Shape PCCM Pager [phone] till 3 pm If no answer page [phone] 06/25/2016, 11:15 AM

## 2016-06-25 NOTE — Progress Notes (Signed)
qPhysical Therapy Treatment Patient Details Name: Gabriel Romero MRN: [id number] DOB: [birth year] Today's Date: 06/25/2016    History of Present Illness Pt is a 66 y.o. male admitted to ED on 06/18/16 with new onset seizures after a fall. CT shows acute hemorrhage in R lateral temporal lobe; C-spine clear. Pertinent PMH includes severe HTN, alcohol use.      PT Comments    Patient progressing with mobility this session and tolerating increased time upright.  Very unstable and with definite gait abnormalities affecting independence and safety.  Feel he will benefit from CIR level rehab at d/c to return to independent.   Follow Up Recommendations  CIR;Supervision/Assistance - 24 hour     Equipment Recommendations  Other (comment) (TBA)    Recommendations for Other Services       Precautions / Restrictions Precautions Precautions: Fall    Mobility  Bed Mobility Overal bed mobility: Needs Assistance Bed Mobility: Supine to Sit     Supine to sit: Min assist     General bed mobility comments: pt attempting with momentum, but assist needed to right trunk  Transfers Overall transfer level: Needs assistance Equipment used: Rolling walker (2 wheeled) Transfers: Sit to/from Stand Sit to Stand: Min guard         General transfer comment: initially stood with UE support, then practice without UE support x 3  Ambulation/Gait Ambulation/Gait assistance: Min assist Ambulation Distance (Feet): 100 Feet (x 2) Assistive device: 1 person hand held assist Gait Pattern/deviations: Step-through pattern;Decreased stride length;Trunk flexed;Shuffle     General Gait Details: Patient progressing at times with improved upright posture with cues and facilitation, also for increased step length, decreased BOS   Stairs Stairs: Yes   Stair Management: Two rails;Alternating pattern;Forwards Number of Stairs: 4 General stair comments: assist for safety  Wheelchair Mobility    Modified  Rankin (Stroke Patients Only) Modified Rankin (Stroke Patients Only) Pre-Morbid Rankin Score: No symptoms Modified Rankin: Moderately severe disability     Balance Overall balance assessment: Needs assistance   Sitting balance-Leahy Scale: Good       Standing balance-Leahy Scale: Fair Standing balance comment: balance activities at rail in hallway, forward march, side cross overs, and backwards walking                            Cognition Arousal/Alertness: Awake/alert Behavior During Therapy: WFL for tasks assessed/performed Overall Cognitive Status: Impaired/Different from baseline                     Current Attention Level: Sustained Memory: Decreased short-term memory Following Commands: Follows one step commands with increased time Safety/Judgement: Decreased awareness of safety;Decreased awareness of deficits Awareness: Emergent Problem Solving: Requires verbal cues;Slow processing        Exercises      General Comments        Pertinent Vitals/Pain Pain Assessment: No/denies pain    Home Living                      Prior Function            PT Goals (current goals can now be found in the care plan section) Progress towards PT goals: Progressing toward goals    Frequency    Min 4X/week      PT Plan Current plan remains appropriate    Co-evaluation             End  of Session Equipment Utilized During Treatment: Gait belt Activity Tolerance: Patient tolerated treatment well Patient left: in bed;with call bell/phone within reach;Other (comment) (with OT present)   PT Visit Diagnosis: Unsteadiness on feet (R26.81);Other abnormalities of gait and mobility (R26.89);Muscle weakness (generalized) (M62.81)     Time: 1340-1405 PT Time Calculation (min) (ACUTE ONLY): 25 min  Charges:  $Gait Training: 8-22 mins $Neuromuscular Re-education: 8-22 mins                    G CodesMagda Kiel,  [place] [phone] 06/25/2016    Reginia Naas 06/25/2016, 4:57 PM

## 2016-06-25 NOTE — Care Management Important Message (Signed)
Important Message  Patient Details  Name: Gabriel Romero MRN: [id number] Date of Birth: Dec 05, 1950   Medicare Important Message Given:  Yes    [name] 06/25/2016, 12:22 PM

## 2016-06-25 NOTE — Progress Notes (Signed)
Rehab admissions - I spoke with attending PA.  Patient may be medically ready for rehab in the next 24 hours or so.  I will open the case with insurance carrier and request acute inpatient rehab admission for tomorrow.  I will follow up after I hear back from insurance carrier.  Call me for questions.  #[phone]

## 2016-06-25 NOTE — Progress Notes (Signed)
  Speech Language Pathology Treatment: Cognitive-Linquistic  Patient Details Name: Gabriel Romero MRN: [id number] DOB: May 09, 1950 Today's Date: 06/25/2016 Time: 4098-1191 SLP Time Calculation (min) (ACUTE ONLY): 14 min  Assessment / Plan / Recommendation Clinical Impression  Pt has improved sustained attention compared to previous SLP visit with only Min cues needed throughout session. SLP provided Mod cues for basic problem solving for pt to utilize his personal cell phone in order to call his daughter, with her number already saved in his phone. Mod cues also needed for intellectual and emergent awareness of cognitive deficits. Pt was briefly tearful while talking about his current situation. Will continue to follow.   HPI HPI: 66 y.o.malewith a history of severe hypertension who presents s/p with new onset seizures. CT Head showed acute ICH right lateral temporal lobe. MRI brain pending.       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 CCC-SLP [phone]

## 2016-06-25 NOTE — Progress Notes (Signed)
Occupational Therapy Treatment Patient Details Name: Gabriel Romero MRN: [id number] DOB: [birth year] Today's Date: 06/25/2016    History of present illness Pt is a 66 y.o. male admitted to ED on 06/18/16 with new onset seizures after a fall. CT shows acute hemorrhage in R lateral temporal lobe; C-spine clear. Pertinent PMH includes severe HTN, alcohol use.     OT comments  Pt progressing with OT goals this session and recalling more details about how he fell. Pt with decreased balance and awareness during session impacting his ability to perform ADL safely. Pt still requires CIR level therapy to return to PLOF and maximize safety and independence in ADL and functional transfers. Next session to focus on multi-step commands/sequencing tasks during sink level grooming.   Follow Up Recommendations  CIR    Equipment Recommendations  Other (comment) (defer to next venue)    Recommendations for Other Services Speech consult    Precautions / Restrictions Precautions Precautions: Fall Restrictions Weight Bearing Restrictions: No       Mobility Bed Mobility Overal bed mobility: Needs Assistance Bed Mobility: Supine to Sit     Supine to sit: Min assist     General bed mobility comments: Pt OOB in recliner when OT arrived  Transfers Overall transfer level: Needs assistance Equipment used: Rolling walker (2 wheeled) Transfers: Sit to/from Stand Sit to Stand: Min guard         General transfer comment: stood without UE support (just practiced with PT)    Balance Overall balance assessment: Needs assistance Sitting-balance support: No upper extremity supported;Feet supported Sitting balance-Leahy Scale: Good Sitting balance - Comments: sitting EOB   Standing balance support: No upper extremity supported;During functional activity Standing balance-Leahy Scale: Fair Standing balance comment: sink level ADL                           ADL either performed or assessed  with clinical judgement   ADL Overall ADL's : Needs assistance/impaired     Grooming: Wash/dry hands;Min guard;Standing Grooming Details (indicate cue type and reason): sink level                 Toilet Transfer: Minimal assistance;Cueing for safety;Ambulation Toilet Transfer Details (indicate cue type and reason): simulated with recliner         Functional mobility during ADLs: Minimal assistance       Vision   Vision Assessment?: Yes Tracking/Visual Pursuits: Able to track stimulus in all quads without difficulty Convergence: Within functional limits Additional Comments: read from menu with some errors with glasses on; able to seek and find objects in room   Perception     Praxis      Cognition Arousal/Alertness: Awake/alert Behavior During Therapy: [facility] for tasks assessed/performed Overall Cognitive Status: Impaired/Different from baseline Area of Impairment: Attention;Memory;Following commands;Safety/judgement;Awareness;Problem solving                   Current Attention Level: Sustained Memory: Decreased short-term memory Following Commands: Follows one step commands with increased time Safety/Judgement: Decreased awareness of safety;Decreased awareness of deficits Awareness: Emergent Problem Solving: Requires verbal cues;Slow processing          Exercises     Shoulder Instructions       General Comments      Pertinent Vitals/ Pain       Pain Assessment: No/denies pain  Home Living  Prior Functioning/Environment              Frequency  Min 2X/week        Progress Toward Goals  OT Goals(current goals can now be found in the care plan section)  Progress towards OT goals: Progressing toward goals  Acute Rehab OT Goals 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 OT Goal Formulation: With patient Potential to Achieve Goals: Good  Plan Discharge  plan remains appropriate    Co-evaluation                 End of Session Equipment Utilized During Treatment: Gait belt  OT Visit Diagnosis: Unsteadiness on feet (R26.81)   Activity Tolerance Patient tolerated treatment well   Patient Left in chair;with call bell/phone within reach;with chair alarm set   Nurse Communication Mobility status;Precautions        Time: 3154-0086 OT Time Calculation (min): 29 min  Charges: OT General Charges $OT Visit: 1 Procedure OT Treatments $Self Care/Home Management : 23-37 mins Hulda Humphrey OTR/L [place] 06/25/2016, 5:39 PM

## 2016-06-25 NOTE — Progress Notes (Addendum)
STROKE TEAM PROGRESS NOTE   SUBJECTIVE (INTERVAL HISTORY) Patient  Is awake, alert, calm and cooperative. Likely transfer to rehab in 1-2 days OBJECTIVE  Recent Labs Lab 06/18/16 1522  GLUCAP 173*    Recent Labs Lab 06/21/16 0322 06/22/16 1232 06/23/16 0501 06/24/16 0320 06/25/16 0400  NA 143 142 147* 143 144  K 3.5 3.4* 4.2 3.7 3.5  CL 113* 115* 116* 116* 116*  CO2 22 20* 22 23 22   GLUCOSE 97 116* 106* 111* 108*  BUN 23* 16 17 21* 18  CREATININE 1.59* 1.37* 1.40* 1.37* 1.35*  CALCIUM 8.6* 8.6* 8.6* 8.6* 8.5*  MG  --  1.8 2.1 2.0 1.8  PHOS  --  3.4 4.2 2.5 3.6    Recent Labs Lab 06/18/16 1530 06/24/16 0320 06/25/16 0400  AST 27  --   --   ALT 18  --   --   ALKPHOS 69  --   --   BILITOT 1.3*  --   --   PROT 7.4  --   --   ALBUMIN 4.2 3.4* 3.0*    Recent Labs Lab 06/21/16 0322 06/22/16 1232 06/23/16 0501 06/24/16 0320 06/25/16 0400  WBC 10.2 9.3 8.0 9.6 7.9  NEUTROABS  --   --  6.2 7.1 5.3  HGB 14.1 13.2 13.0 13.2 12.0*  HCT 42.8 40.8 39.6 40.7 37.3*  MCV 96.2 95.6 96.8 96.9 96.1  PLT 172 151 162 187 174   No results for input(s): LABPROT, INR in the last 72 hours. No results for input(s): COLORURINE, LABSPEC, [place], GLUCOSEU, HGBUR, BILIRUBINUR, KETONESUR, PROTEINUR, UROBILINOGEN, NITRITE, LEUKOCYTESUR in the last 72 hours.  Invalid input(s): APPERANCEUR      Component Value Date/Time   LABOPIA NONE DETECTED 06/18/2016 1743   COCAINSCRNUR NONE DETECTED 06/18/2016 1743   LABBENZ POSITIVE (A) 06/18/2016 1743   AMPHETMU NONE DETECTED 06/18/2016 1743   THCU NONE DETECTED 06/18/2016 1743   LABBARB NONE DETECTED 06/18/2016 1743     Recent Labs Lab 06/18/16 1530  ETH <5    Imaging  Ct Angio Head W Or Wo Contrast 06/18/2016 1. Negative CTA of the brain. No acute vascular abnormality identified. No AVM or other vascular abnormality seen underlying the right temporal lobe hemorrhage.  2. Atheromatous plaque involving the carotid siphons with  moderate to advanced multifocal narrowing (approximately 50-75%), left greater than right.  3. No significant interval change in size and appearance of right temporal lobe hemorrhage, measuring 2.6 x 1.7 x 1.8 cm. No significant mass effect.      Ct Head Wo Contrast 06/20/2016 1. Stable right anterolateral temporal lobe hematoma.  2. Stable advanced chronic microvascular ischemic changes, chronic infarctions, and volume loss of the brain.  3. Stable mildly displaced acute nasal bone fractures.  4. No new acute infarct or hemorrhage identified.    Ct Head Wo Contrast 06/19/2016 1. Stable acute hemorrhage within the right lateral temporal lobe. This area is atypical for hypertensive hemorrhage and traumatic hemorrhagic contusion or underlying hemorrhagic mass should be considered.  2. Stable advanced chronic microvascular ischemic changes of the brain, chronic infarcts, and volume loss.  3. Acute appearing mildly displaced nasal bone fractures and chronic left lamina papyracea fracture are stable.     Ct Head Wo Contrast 06/18/2016 Small area of acute hemorrhage in the right lateral temporal lobe. Hemorrhage volume 3.3 mL.  No significant surrounding edema or mass lesion.  Differential considerations would include a traumatic hemorrhagic contusion. Venous infarction is possible.  Hemorrhagic mass or  infarct are consider less likely. Further evaluation with MRI and CTA suggested.  Generalized atrophy with diffuse white matter disease most likely chronic.  Hypodensity in the right parietal lobe most likely a chronic infarct.     Ct Cervical Spine Wo Contrast 06/18/2016 No evidence of acute cervical spine fracture, traumatic subluxation or static signs of instability. Multilevel spondylosis, contributing to spinal and foraminal narrowing as described.    Carotid Doppler: Preliminary findings: Consistent with a high end 60 - 79 percent stenosis involving the proximal left internal carotid  artery. Findings consistent with a high end 1- 39 percent stenosis involving the proximal right internal carotid artery. Intimal thickening vs soft plaque bilaterally.  Bilateral vertebral arteries are patent and antegrade.    PHYSICAL EXAM Vitals:   06/24/16 1303 06/25/16 0118 06/25/16 0521 06/25/16 1005  BP: (!) 166/81 137/63 (!) 178/93 (!) 172/75  Pulse: 74 65 80 80  Resp: 18 18 18 19   Temp: 98.2 F (36.8 C) 98.6 F (37 C) 98.6 F (37 C) 98.5 F (36.9 C)  TempSrc: Oral Oral Oral Oral  SpO2: 99% 93% 95% 95%  Weight:      Height:       General: Vital signs reviewed.  Patient is well-developed and well-nourished, in no acute distress and    Cardiovascular: Bradycardic, regular rhythm, no murmurs, gallops, or rubs. Pulmonary/Chest: Clear to auscultation bilaterally, no wheezes, rales, or rhonchi. Abdominal: Soft, non-tender, non-distended, BS + Neurological Exam  :  Awake, alert, disoriented. Diminished attention, registration and recall. No dysarthria. Follows two-step commands. Diminished recall. Extraocular movements are pharyngeal or nystagmus. Blinks to threat bilaterally. Fundi were not visualized. Face is symmetric without weakness. Not cooperative for detailed visual field testing but suspect left superior quadrant vision field loss. Face is symmetric. Tongue is midline. Motor system exam no upper or lower extremity drift. Symmetric and equal strength in all 4 extremities. No focal weakness. Deep tendon reflexes are symmetric plantars are downgoing. Gait not tested.   ASSESSMENT/PLAN [name] is a 66 y.o. male with history of hypertension, alcohol abuse admitted for a seizure and found to have an intracranial temporal lobe hemorrhage.   Right Temporal lobe intraparenchymal hematoma: Unclear if the hematoma is secondary to fall, tumor, aneurysm or AVM.   CT head: Small area of acute hemorrhage in the right lateral temporal lobe. Hemorrhage volume 3.3 mL. No  significant surrounding edema or mass lesion. Hypodensity in the right parietal lobe most likely a chronic infarct.  CTA Brain: Negative CTA of the brain. No AVM or other vascular abnormality seen underlying the right temporal lobe hemorrhage. Atheromatous plaque involving the carotid siphons with moderate to advanced multifocal narrowing (approximately 50-75%), left greater than right.   Repeat CT Head 3/30: Stable right anterolateral temporal lobe hematoma. Stable advanced chronic microvascular ischemic changes, chronic infarctions, and volume loss of the brain.  MRI - pending   Carotid Doppler: Consistent with a high end 60 - 79 percent stenosis involving the proximal Lt ICA.   Findings consistent with a high end 1- 39 percent stenosis involving the proximal right internal carotid artery. Intimal thickening vs soft plaque bilaterally.  Bilateral vertebral arteries are patent and antegrade.  2D Echo - EF 40-45%. No cardiac source of emboli identified.  LDL 123  HgbA1c 5.6  SCDs for VTE prophylaxis given ICH  Diet Heart Room service appropriate? Yes; Fluid consistency: Thin   No antithrombotic prior to admission, now on No antithrombotic  Ongoing aggressive stroke risk  factor management  Therapy recommendations:  CIR  Disposition:  Pending  Seizure:  On Keppra 500 mg BID  EEG - mild gen slowing. No seizures  No history of seizures or recurrent seizures   Alcohol level on admission <5, UDS positive for benzos, UA negative, TSH normal, HIV and RPR non reactive  Alcohol Withdrawal  Patient became very agitated yesterday requiring haldol and precedex gtt  Heavy alcohol use at home, today patient agitated and uncooperative  precedex held due to bradycardia  CIWA - transfer back to the ICU. Critical care to manage withdrawal.   Blood pressure significantly elevated in agitated state.  Diabetes  HgbA1c 5.6 goal < 7.0  CBG monitoring   Hypertension  Home meds:  None  history of uncontrolled HTN, but not taking any medications at home  Maintain BP [id number] systolic-     Hyperlipidemia  Home meds: None  Currently on atorvastatin 20 mg QD  LDL 123, goal < 70  Continue statin at discharge  Other Stroke Risk Factors  Cigarette smoker, advised to stop smoking  ETOH use  Obesity, Body mass index is 28.47 kg/m.   Other Active Problems  CKD III:  Creatinine 1.5-1.8  Alcohol Abuse: Drinks 8 beers per day. On CIWA, Precedex   Tobacco abuse- Nicotine patch  Hospital day # 7     Patient's  encephalopathy from his hemorrhage as well as alcohol withdrawal seems to gradually improving. Continue strict control of hypertension. Continue Keppra 500 mg twice daily at discharge. Patient should be advised not to drive .Follow-up as an outpatient in stroke clinic in 6 weeks. Transfer to inpatient rehabilitation when bed available. Stroke team will sign off. Kindly call for questions.    Antony Contras, MD Medical Director [facility] Pager: [phone] 06/25/2016 1:26 PM    To contact Stroke Continuity provider, please refer to [email or web address] After hours, contact General Neurology

## 2016-06-26 ENCOUNTER — Encounter (HOSPITAL_COMMUNITY): Payer: Self-pay | Admitting: General Practice

## 2016-06-26 LAB — BASIC METABOLIC PANEL
ANION GAP: 8 (ref 5–15)
BUN: 16 mg/dL (ref 6–20)
CO2: 24 mmol/L (ref 22–32)
Calcium: 8.5 mg/dL — ABNORMAL LOW (ref 8.9–10.3)
Chloride: 112 mmol/L — ABNORMAL HIGH (ref 101–111)
Creatinine, Ser: 1.26 mg/dL — ABNORMAL HIGH (ref 0.61–1.24)
GFR, EST NON AFRICAN AMERICAN: 58 mL/min — AB (ref >60)
Glucose, Bld: 100 mg/dL — ABNORMAL HIGH (ref 65–99)
POTASSIUM: 3.5 mmol/L (ref 3.5–5.1)
SODIUM: 144 mmol/L (ref 135–145)

## 2016-06-26 LAB — CBC WITH DIFFERENTIAL/PLATELET
BASOS ABS: 0 10*3/uL (ref 0.0–0.1)
BASOS PCT: 0 %
EOS ABS: 0.3 10*3/uL (ref 0.0–0.7)
EOS PCT: 4 %
HCT: 36.1 % — ABNORMAL LOW (ref 39.0–52.0)
Hemoglobin: 11.6 g/dL — ABNORMAL LOW (ref 13.0–17.0)
LYMPHS PCT: 19 %
Lymphs Abs: 1.6 10*3/uL (ref 0.7–4.0)
MCH: 30.9 pg (ref 26.0–34.0)
MCHC: 32.1 g/dL (ref 30.0–36.0)
MCV: 96 fL (ref 78.0–100.0)
MONO ABS: 0.9 10*3/uL (ref 0.1–1.0)
Monocytes Relative: 11 %
Neutro Abs: 5.5 10*3/uL (ref 1.7–7.7)
Neutrophils Relative %: 66 %
Platelets: 184 10*3/uL (ref 150–400)
RBC: 3.76 MIL/uL — AB (ref 4.22–5.81)
RDW: 13 % (ref 11.5–15.5)
WBC: 8.3 10*3/uL (ref 4.0–10.5)

## 2016-06-26 LAB — MAGNESIUM: MAGNESIUM: 1.8 mg/dL (ref 1.7–2.4)

## 2016-06-26 NOTE — Progress Notes (Signed)
Rehab admissions - I did received authorization for acute inpatient rehab admission.  But, our rehab Md, Dr. Letta Pate has reviewed the case and feels that the patient is doing too well functionally to meet criteria for an acute inpatient rehab admission.  I have let the case manager and social worker know.  Patient is interested in pursuing SNF options.  #[phone]

## 2016-06-26 NOTE — Progress Notes (Signed)
Patient does not use call bell/phone even with repeated instruction; pt consistently sets bed alarm off when needs to use bathroom. More sensitive bed alarm setting used during shift.  Continue to educate patient and monitor.

## 2016-06-26 NOTE — Progress Notes (Signed)
Pulmonary & Critical Care Attending Note  Presenting HPI:  65 y.o. obese male with severe hypertension and hx of etoh intake nos. Admitted 3/28/2018with new onset seizures and associated temporal lobe hemorrhage. Chart review shows that even As of 06/19/16 he developed agitated encephalopathy initially needing haldol and precedex gtt. Daughter also described hx of snorign with pauses concerning for undiagnosed OSA. Also rx with cleviprex of high bp. EEG 3/31 with toxic encephalopaty. On 06/21/16 precedex stopped due to bradycardia HR 40-50s and moved to 68M medical floor. Overnight there severe agitatino with fall out of bed needing bodygaruds and increased ativan. PCCM consulted for take over primary service and and agitation management. Neuro elected to stay on as consultant.  Subjective:  Patient denies any chest pain or pressure. Denies any dyspnea.  Review of Systems:  Denies any nausea or emesis. No abdominal pain. No headache. No fever or chills.   Temp:  [97.5 F (36.4 C)-98.9 F (37.2 C)] 97.5 F (36.4 C) (04/05 0512) Pulse Rate:  [63-89] 63 (04/05 0512) Resp:  [16-19] 18 (04/05 0512) BP: (164-173)/(73-98) 173/86 (04/05 0512) SpO2:  [94 %-99 %] 96 % (04/05 0512) Weight:  [205 lb 4.8 oz (93.1 kg)] 205 lb 4.8 oz (93.1 kg) (04/05 0436)  General:  Awake. No distress. Sitting at nurses' station. Integument:  Warm & dry. No rash on exposed skin. HEENT:  No scleral icterus or injection. Pupils symmetric.  Pulmonary:  Clear bilaterally to auscultation. No accessory muscle use on room air. Good aeration bilaterally.  Cardiovascular:  Regular rate & rhythm. No JVD apprecaited. No edema. Abdomen:  Soft. Nontender. Normal bowel sounds. Neurological:  Cranial nerves grossly in tact. No meningismus. Moving all 4 extremities equally. Oriented x4.   CBC Latest Ref Rng & Units 06/26/2016 06/25/2016 06/24/2016  WBC 4.0 - 10.5 K/uL 8.3 7.9 9.6  Hemoglobin 13.0 - 17.0 g/dL 11.6(L) 12.0(L) 13.2   Hematocrit 39.0 - 52.0 % 36.1(L) 37.3(L) 40.7  Platelets 150 - 400 K/uL 184 174 187    BMP Latest Ref Rng & Units 06/26/2016 06/25/2016 06/24/2016  Glucose 65 - 99 mg/dL 100(H) 108(H) 111(H)  BUN 6 - 20 mg/dL 16 18 21(H)  Creatinine 0.61 - 1.24 mg/dL 1.26(H) 1.35(H) 1.37(H)  Sodium 135 - 145 mmol/L 144 144 143  Potassium 3.5 - 5.1 mmol/L 3.5 3.5 3.7  Chloride 101 - 111 mmol/L 112(H) 116(H) 116(H)  CO2 22 - 32 mmol/L 24 22 23   Calcium 8.9 - 10.3 mg/dL 8.5(L) 8.5(L) 8.6(L)    IMAGING/STUDIES: CTA HEAD 3/28: 1. Negative CTA of the brain. No acute vascular abnormality identified. No AVM or other vascular abnormality seen underlying the right temporal lobe hemorrhage. 2. Atheromatous plaque involving the carotid siphons with moderate to advanced multifocal narrowing (approximately 50-75%), left greater than right. 3. No significant interval change in size and appearance of right temporal lobe hemorrhage, measuring 2.6 x 1.7 x 1.8 cm. No significant mass effect. CT C-SPINE 3/28:  No evidence of acute cervical spine fracture, traumatic subluxation or static signs of instability. Multilevel spondylosis, contributing to spinal and foraminal narrowing as described. CAROTID DUPLEX 3/29: - The vertebral arteries appear patent with antegrade flow. - Findings consistent with a high end 1- 39% stenosis involving the proximal right internal carotid artery. - Findings consistent with a high end 60 - 79% stenosis involving the proximal left internal carotid artery. CT HEAD W/O 3/30: 1. Stable right anterolateral temporal lobe hematoma. 2. Stable advanced chronic microvascular ischemic changes, chronic infarctions, and volume  loss of the brain. 3. Stable mildly displaced acute nasal bone fractures. 4. No new acute infarct or hemorrhage identified. TTE 3/30:  LV normal in size with severe concentric hypertrophy. EF 40-45% with diffuse hypokinesis & grade 1 diastolic dysfunction.LA & RA normal in size. RV  normal in size and function.Possibly bicuspid aortic valve. No stenosis or regurgitation. Aortic root normal in size. No mitral stenosis or regurgitation. No pulmonic stenosis or regurgitation. Trivial tricuspid regurgitation. No pericardial effusion. EEG 3/31:  This EEG is suggestive of a mild generalized non-specific cerebral dysfunction(encephalopathy). There was no seizure or seizure predisposition recorded on this study. Please note that a normal EEG does not preclude the possibility of epilepsy.  CT HEAD W/O 4/2: 1. Unchanged size of anterior right temporal lobe intraparenchymal hematoma without new area of hemorrhage or mass effect. 2. Advanced chronic microvascular ischemia and old infarcts. EKG 4/3:  Previously reviewed. QTc 475 ms w/ some artifact. Otherwise sinus rhythm.   MICROBIOLOGY: MRSA PCR 3/28:  Negative  HIV 3/29: Nonreactive RPR 3/29:  Nonreactive  ANTIBIOTICS: None  SIGNIFICANT EVENTS: 03/28 - Admit to Stroke Service 04/01 - Transfer to ICU w/ alcohol withdrawal 04/02 - Weaned off Precedex drip  ASSESSMENT/PLAN:  66 y.o. male with right temporal lobe hemorrhage & subsequent seizure. Patient with alcohol use complicated by withdrawal this admission. Patient also had known essential hypertension not previously on medications. Blood pressure still remained somewhat elevated but medication uptitrated yesterday. Mental status remains stable and he seems to have recovered from his withdrawal. He will need a slow taper of his oral benzodiazepines.  1. Left temporal lobe hemorrhage: Plan for follow-up in 6 weeks with neurology. No further imaging necessary at this time. Plan for further rehabilitation. 2. New onset seizures: Continuing Keppra by mouth twice a day per neurology recommendations. Patient is not allowed to drive. Plan for follow-up in 6 weeks with neurology. 3. Acute encephalopathy: Secondary to alcohol withdrawal. Resolved. Continuing oral thiamine. 4. Acute  alcohol withdrawal with delirium: Resolved. Continuing Valium 5 mg by mouth 3 times a day. Recommend a prolonged benzodiazepine taper with close monitoring for signs of withdrawal and seizure activity. 5. Essential hypertension: Continuing Norvasc 10 mg by mouth daily. Additional antihypertensives can be added as needed once this drug reaches steady state. Goal systolic blood pressure less than 194mmHg per Neurology. 6. Hyperlipidemia: Continuing Lipitor by mouth daily at bedtime. 7. Hyponatremia: Resolved. 8. Acute renal failure: Resolved. 9. Tobacco use disorder: Continuing daily nicotine patch. 10. Disposition: Plan to discharge patient to CIR once bed available.  Sonia Baller Ashok Cordia, M.D. [facility] Pulmonary & Critical Care Pager:  [phone] After 3pm or if no response, call [phone] 9:36 AM 06/26/16

## 2016-06-26 NOTE — NC FL2 (Signed)
[place] LEVEL OF CARE SCREENING TOOL     IDENTIFICATION  Patient Name: Gabriel Romero Birthdate: 08-14-1950 Sex: male Admission Date (Current Location): 06/18/2016  [facility] and [place] Number:  Herbalist and Address:  [facility] [place], [place] [address] [place], [place] [address]      Provider Number: [id number]  Attending Physician Name and Address:  Brand Males, MD  Relative Name and Phone Number:       Current Level of Care: Hospital Recommended Level of Care: [place] Prior Approval Number:    Date Approved/Denied:   PASRR Number: [id number]  Discharge Plan: SNF    Current Diagnoses: Patient Active Problem List   Diagnosis Date Noted  . Seizure ([place])   . Alcohol withdrawal syndrome, with delirium ([place])   . Smoker   . ICH (intracerebral hemorrhage) ([place]) 06/18/2016    Orientation RESPIRATION BLADDER Height & Weight     Self, Time, Situation, Place  Normal Continent Weight: 205 lb 4.8 oz (93.1 kg) Height:  5\' 10"  (177.8 cm)  BEHAVIORAL SYMPTOMS/MOOD NEUROLOGICAL BOWEL NUTRITION STATUS   (None) Convulsions/Seizures Continent Diet (Heart healthy)  AMBULATORY STATUS COMMUNICATION OF NEEDS Skin   Limited Assist Verbally Skin abrasions, Bruising                       Personal Care Assistance Level of Assistance  Bathing, Feeding, Dressing Bathing Assistance: Limited assistance Feeding assistance: Independent Dressing Assistance: Limited assistance     Functional Limitations Info  Sight, Hearing, Speech Sight Info: Adequate Hearing Info: Adequate Speech Info: Adequate    SPECIAL CARE FACTORS FREQUENCY  PT (By licensed PT), OT (By licensed OT)     PT Frequency: 5 x week OT Frequency: 5 x week            Contractures Contractures Info: Not present    Additional Factors Info  Code Status, Allergies Code Status Info: Full Allergies Info: Sulfa Antibiotics            Current Medications (06/26/2016):  This is the current hospital active medication list Current Facility-Administered Medications  Medication Dose Route Frequency Provider Last Rate Last Dose  . acetaminophen (TYLENOL) solution 650 mg  650 mg Per Tube Q4H PRN Greta Doom, MD       Or  . acetaminophen (TYLENOL) suppository 650 mg  650 mg Rectal Q4H PRN Greta Doom, MD      . amLODipine (NORVASC) tablet 10 mg  10 mg Oral Daily Grace Bushy Minor, NP   10 mg at 06/26/16 0902  . atorvastatin (LIPITOR) tablet 20 mg  20 mg Oral q1800 Javier Glazier, MD   20 mg at 06/25/16 1804  . diazepam (VALIUM) tablet 5 mg  5 mg Oral TID Javier Glazier, MD   5 mg at 06/26/16 0902  . folic acid (FOLVITE) tablet 1 mg  1 mg Oral Daily Javier Glazier, MD   1 mg at 06/26/16 0902  . hydrALAZINE (APRESOLINE) injection 10 mg  10 mg Intravenous Q4H PRN Florinda Marker, MD   10 mg at 06/24/16 0846  . labetalol (NORMODYNE,TRANDATE) injection 10 mg  10 mg Intravenous Q2H PRN Javier Glazier, MD   10 mg at 06/24/16 1124  . levETIRAcetam (KEPPRA) tablet 500 mg  500 mg Oral BID Brand Males, MD   500 mg at 06/26/16 0902  . LORazepam (ATIVAN) tablet 1 mg  1 mg Oral Q2H  PRN Rosalin Hawking, MD   1 mg at 06/21/16 2103   Or  . LORazepam (ATIVAN) injection 1 mg  1 mg Intravenous Q2H PRN Rosalin Hawking, MD   1 mg at 06/24/16 0344  . multivitamin (PROSIGHT) tablet 1 tablet  1 tablet Oral Daily Javier Glazier, MD   1 tablet at 06/26/16 0902  . nicotine (NICODERM CQ - dosed in mg/24 hr) patch 7 mg  7 mg Transdermal Daily Grace Bushy Minor, NP   7 mg at 06/26/16 0902  . pantoprazole (PROTONIX) EC tablet 40 mg  40 mg Oral Daily Javier Glazier, MD   40 mg at 06/26/16 0902  . thiamine (VITAMIN B-1) tablet 100 mg  100 mg Oral Daily Javier Glazier, MD   100 mg at 06/26/16 0902     Discharge Medications: Please see discharge summary for a list of discharge medications.  Relevant Imaging Results:  Relevant Lab  Results:   Additional Information SS#: [id number]  Candie Chroman, LCSW

## 2016-06-26 NOTE — Progress Notes (Signed)
qPhysical Therapy Treatment Patient Details Name: Gabriel Romero MRN: [id number] DOB: 1951/03/04 Today's Date: 06/26/2016    History of Present Illness Pt is a 66 y.o. male admitted to ED on 06/18/16 with new onset seizures after a fall. CT shows acute hemorrhage in R lateral temporal lobe; C-spine clear. Pertinent PMH includes severe HTN, alcohol use.      PT Comments    Pt ambulation goal completed and care plan has been updated.  He is progressing well towards other PT goals, requiring min guard for transfers and ambulation for safety. He required min assist with stairs for stability. The current plan remains appropriate, as pt would benefit from continued rehab services to increase overall strength, stability, and safety with functional mobility.   Follow Up Recommendations  CIR;Supervision/Assistance - 24 hour     Equipment Recommendations  Other (comment)    Recommendations for Other Services Rehab consult     Precautions / Restrictions Precautions Precautions: Fall Restrictions Weight Bearing Restrictions: No    Mobility  Bed Mobility               General bed mobility comments: OOB in recliner at nurses station at start of session.  Transfers Overall transfer level: Needs assistance Equipment used: None Transfers: Sit to/from Stand Sit to Stand: Min guard         General transfer comment: Min guard for safety.  Ambulation/Gait Ambulation/Gait assistance: Min guard Ambulation Distance (Feet): 250 Feet Assistive device: None;Straight cane Gait Pattern/deviations: Step-through pattern;Decreased stride length;Drifts right/left;Trunk flexed Gait velocity: decreased Gait velocity interpretation: Below normal speed for age/gender General Gait Details: Pt started without AD but after appearing unsteady, given single point cane and steadiness improved.  VC's for upright posture and to stay in center of hallway.  Reached for handrail with hand opposite  cane.   Stairs Stairs: Yes   Stair Management: One rail Right;Two rails;Step to pattern Number of Stairs: 5 General stair comments: Hand held assist for safety and steadying as pt appeared unsteady at first.  Pt states he feels better holding onto the railing.  Wheelchair Mobility    Modified Rankin (Stroke Patients Only) Modified Rankin (Stroke Patients Only) Pre-Morbid Rankin Score: No symptoms Modified Rankin: Moderately severe disability     Balance Overall balance assessment: Needs assistance Sitting-balance support: No upper extremity supported;Feet supported Sitting balance-Leahy Scale: Good     Standing balance support: No upper extremity supported Standing balance-Leahy Scale: Good Standing balance comment: better with cane but can maintain without.                            Cognition Arousal/Alertness: Awake/alert Behavior During Therapy: WFL for tasks assessed/performed Overall Cognitive Status: Impaired/Different from baseline Area of Impairment: Attention;Safety/judgement;Memory                 Orientation Level: Disoriented to;Situation Current Attention Level: Sustained Memory: Decreased short-term memory Following Commands: Follows one step commands with increased time Safety/Judgement: Decreased awareness of safety;Decreased awareness of deficits Awareness: Emergent Problem Solving: Slow processing;Requires verbal cues General Comments: Pt easily distracted.  Nurse had him in recliner at station because kept getting up and setting off bed/chair alarms alone in room.      Exercises      General Comments General comments (skin integrity, edema, etc.): Pt had one sudden episode of sadness over his condition and talking to his family about it, suggesting lability.      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 PT Goal Formulation: With patient Time For Goal Achievement: 07/03/16 Potential to Achieve Goals: Good Progress towards PT goals: Progressing toward goals;Goals met and updated - see care plan    Frequency    Min 4X/week      PT Plan Current plan remains appropriate    Co-evaluation             End of Session Equipment Utilized During Treatment: Gait belt Activity Tolerance: Patient tolerated treatment well Patient left: in chair (at nurses station) Nurse Communication: Mobility status PT Visit Diagnosis: Unsteadiness on feet (R26.81);Other abnormalities of gait and mobility (R26.89);Muscle weakness (generalized) (M62.81)     Time: 5379-4327 PT Time Calculation (min) (ACUTE ONLY): 19 min  Charges:                       G Codes:       Gabriel Romero SPT   Gabriel Romero 06/26/2016, 2:21 PM

## 2016-06-26 NOTE — Progress Notes (Signed)
Pt noted more alert and oriented x4 today. He verbalized he understand why he is in the hospital and will try to comply with staffs. Will continue to monitor.   Ave Filter, RN

## 2016-06-26 NOTE — Clinical Social Work Note (Signed)
Clinical Social Work Assessment  Patient Details  Name: Gabriel Romero MRN: [id number] Date of Birth: 03/18/1951  Date of referral:  06/26/16               Reason for consult:  Facility Placement, Discharge Planning                Permission sought to share information with:  Facility Sport and exercise psychologist, Family Supports Permission granted to share information::  Yes, Verbal Permission Granted  Name::        Agency::  SNF's  Relationship::     Contact Information:     Housing/Transportation Living arrangements for the past 2 months:  Single Family Home Source of Information:  Patient, Medical Team, Friend/Neighbor Patient Interpreter Needed:  None Criminal Activity/Legal Involvement Pertinent to Current Situation/Hospitalization:  No - Comment as needed Significant Relationships:  Adult Children, Friend Lives with:  Adult Children Do you feel safe going back to the place where you live?  Yes Need for family participation in patient care:  Yes (Comment)  Care giving concerns:  PT recommending CIR but patient is doing too well for their services.   Social Worker assessment / plan:  CSW met with patient. Friend at bedside. Patient gave permission for CSW to speak in front of him. CSW introduced role and explained that PT recommendations would be discussed. At the time of assessment, CIR did not yet have insurance authorization. Patient is agreeable to SNF if he is unable to go to CIR. CIR staff have determined that the patient is doing well enough that he does not need their rehab services. During assessment, patient was hyperfocused on disability and work. His friend was able to easily redirect him but he had to do this several times. No further concerns. CSW encouraged patient to contact CSW as needed. CSW will continue to follow patient for support and facilitate discharge to SNF once medically stable.  Employment status:  Transport planner PT  Recommendations:  Inpatient Rehab Consult Information / Referral to community resources:  [place]  Patient/Family's Response to care:  Patient agreeable to SNF placement. Patient's children and friend supportive and involved in patient's care. Patient appreciated social work intervention.  Patient/Family's Understanding of and Emotional Response to Diagnosis, Current Treatment, and Prognosis:  Patient appears to have a good understanding of the reason for admission and PT recommendations but appears anxious in regards to going back to work. Patient appears happy with hospital care.  Emotional Assessment Appearance:  Appears stated age Attitude/Demeanor/Rapport:  Other (Pleasant) Affect (typically observed):  Accepting, Appropriate, Calm, Pleasant Orientation:  Oriented to Self, Oriented to Place, Oriented to  Time, Oriented to Situation Alcohol / Substance use:  Alcohol Use Psych involvement (Current and /or in the community):  No (Comment)  Discharge Needs  Concerns to be addressed:  Care Coordination Readmission within the last 30 days:  No Current discharge risk:  Dependent with Mobility Barriers to Discharge:  Continued Medical Work up, [place], LCSW 06/26/2016, 4:11 PM

## 2016-06-26 NOTE — Care Management Note (Signed)
Case Management Note  Patient Details  Name: Gabriel Romero MRN: [id number] Date of Birth: 1950/11/30  Subjective/Objective:                    Action/Plan:  DC to CIR today.  Expected Discharge Date:                  Expected Discharge Plan:  IP Rehab Facility  In-House Referral:  Clinical Social Work  Discharge planning Services  CM Consult  Post Acute Care Choice:    Choice offered to:     DME Arranged:    DME Agency:     HH Arranged:    [place] Agency:     Status of Service:  Completed, signed off  If discussed at [employer], dates discussed:    Additional Comments:  Carles Collet, RN 06/26/2016, 3:40 PM

## 2016-06-26 NOTE — Clinical Social Work Placement (Signed)
   CLINICAL SOCIAL WORK PLACEMENT  NOTE  Date:  06/26/2016  Patient Details  Name: Gabriel Romero MRN: [id number] Date of Birth: 08-02-1950  Clinical Social Work is seeking post-discharge placement for this patient at the [place] level of care (*CSW will initial, date and re-position this form in  chart as items are completed):  Yes   Patient/family provided with [place] Work Department's list of facilities offering this level of care within the geographic area requested by the patient (or if unable, by the patient's family).  Yes   Patient/family informed of their freedom to choose among providers that offer the needed level of care, that participate in Medicare, Medicaid or managed care program needed by the patient, have an available bed and are willing to accept the patient.  Yes   Patient/family informed of [facility]'s ownership interest in [facility] and [facility], as well as of the fact that they are under no obligation to receive care at these facilities.  PASRR submitted to EDS on 06/26/16     PASRR number received on 06/26/16     Existing PASRR number confirmed on       FL2 transmitted to all facilities in geographic area requested by pt/family on 06/26/16     FL2 transmitted to all facilities within larger geographic area on       Patient informed that his/her managed care company has contracts with or will negotiate with certain facilities, including the following:            Patient/family informed of bed offers received.  Patient chooses bed at       Physician recommends and patient chooses bed at      Patient to be transferred to   on  .  Patient to be transferred to facility by       Patient family notified on   of transfer.  Name of family member notified:        PHYSICIAN Please sign FL2     Additional Comment:    _______________________________________________ Candie Chroman, LCSW 06/26/2016, 4:15  PM

## 2016-06-26 NOTE — Progress Notes (Signed)
PCCM Attending Note: Reviewed the patient's chart that the patient is too functionally well for inpatient rehab. Social Worker is looking into placement in a skilled nursing facility for ongoing rehab needs. Earlier today I spoke with the patient's daughter and made her aware of his previous rehab plans with CIR. I also told her that he should not be driving until cleared by Neurology. I also made her aware that he would need to follow-up with Neurology 6 weeks after discharge. I am going to transfer care to [facility] while his disposition is being further investigated starting tomorrow (4/6). PCCM will be available as needed and sign off on 4/6.  Sonia Baller Ashok Cordia, M.D. [facility] Pager:  [phone] After 3pm or if no response, call [phone] 5:26 PM 06/26/16

## 2016-06-26 NOTE — Progress Notes (Signed)
Pt's remove TELE and decline it at this time. MD aware.  Ave Filter, RN

## 2016-06-26 NOTE — Progress Notes (Signed)
Patient noted more alert x4 today, less combative and agitated, able to follow command and follow safety procedure to prevent falls.

## 2016-06-26 NOTE — Clinical Social Work Note (Signed)
CSW acknowledges consult. Per admissions coordinator for CIR, they have insurance authorization and can take patient today if stable. She will page MD regarding discharge.   CSW signing off.  Dayton Scrape, [place]

## 2016-06-26 NOTE — Progress Notes (Signed)
Occupational Therapy Treatment Patient Details Name: TAEDEN GELLER MRN: [id number] DOB: 1950/04/16 Today's Date: 06/26/2016    History of present illness Pt is a 66 y.o. male admitted to ED on 06/18/16 with new onset seizures after a fall. CT shows acute hemorrhage in R lateral temporal lobe; C-spine clear. Pertinent PMH includes severe HTN, alcohol use.     OT comments  Pt is making steady progress towards goals.  Pt currently min guard with and without RW with cues to visually attend to Rt during mobility and self-care tasks.  Pt with decreased awareness of deficits and Rt inattention, extremely internally and externally distracted.    Follow Up Recommendations  CIR    Equipment Recommendations       Recommendations for Other Services      Precautions / Restrictions Precautions Precautions: Fall Restrictions Weight Bearing Restrictions: No       Mobility Bed Mobility               General bed mobility comments: OOB in recliner at nurses station at start of session.  Transfers Overall transfer level: Needs assistance Equipment used: None Transfers: Sit to/from Stand Sit to Stand: Supervision         General transfer comment: Min guard for safety.    Balance Overall balance assessment: Needs assistance Sitting-balance support: No upper extremity supported;Feet supported Sitting balance-Leahy Scale: Good     Standing balance support: No upper extremity supported Standing balance-Leahy Scale: Good Standing balance comment: better with cane but can maintain without.                           ADL either performed or assessed with clinical judgement           Perception Perception Comments: Rt inattention with mobility       Cognition Arousal/Alertness: Awake/alert Behavior During Therapy: WFL for tasks assessed/performed Overall Cognitive Status: Impaired/Different from baseline Area of Impairment: Attention;Safety/judgement;Memory                  Orientation Level: Disoriented to;Situation Current Attention Level: Sustained Memory: Decreased short-term memory Following Commands: Follows one step commands with increased time Safety/Judgement: Decreased awareness of safety;Decreased awareness of deficits Awareness: Emergent Problem Solving: Slow processing;Requires verbal cues General Comments: Pt easily distracted.  Nurse had him in recliner at station because kept getting up and setting off bed/chair alarms alone in room.              General Comments Pt had one sudden episode of sadness over his condition and talking to his family about it, suggesting lability.    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

## 2016-06-27 ENCOUNTER — Inpatient Hospital Stay (HOSPITAL_COMMUNITY): Payer: Medicare HMO | Admitting: Occupational Therapy

## 2016-06-27 ENCOUNTER — Inpatient Hospital Stay (HOSPITAL_COMMUNITY): Payer: Medicare HMO

## 2016-06-27 DIAGNOSIS — I10 Essential (primary) hypertension: Secondary | ICD-10-CM | POA: Diagnosis present

## 2016-06-27 LAB — RENAL FUNCTION PANEL
ANION GAP: 9 (ref 5–15)
Albumin: 2.9 g/dL — ABNORMAL LOW (ref 3.5–5.0)
BUN: 15 mg/dL (ref 6–20)
CO2: 25 mmol/L (ref 22–32)
CREATININE: 1.26 mg/dL — AB (ref 0.61–1.24)
Calcium: 8.8 mg/dL — ABNORMAL LOW (ref 8.9–10.3)
Chloride: 109 mmol/L (ref 101–111)
GFR, EST NON AFRICAN AMERICAN: 58 mL/min — AB (ref >60)
Glucose, Bld: 99 mg/dL (ref 65–99)
POTASSIUM: 3.6 mmol/L (ref 3.5–5.1)
Phosphorus: 3.4 mg/dL (ref 2.5–4.6)
Sodium: 143 mmol/L (ref 135–145)

## 2016-06-27 LAB — MAGNESIUM: Magnesium: 1.8 mg/dL (ref 1.7–2.4)

## 2016-06-27 NOTE — Clinical Social Work Note (Addendum)
CSW provided bed offers. Patient has chosen [facility] due to proximity to his home. Patient still focused on getting disability this morning. CSW notified admissions coordinator at [facility] of acceptance. They will start insurance authorization.  Dayton Scrape, [place]  2:58 pm [facility] has not yet heard anything about authorization but are trying to get in touch with someone to check the status of it. They stated that [facility] is not open on the weekends.  Dayton Scrape, [place]

## 2016-06-27 NOTE — Progress Notes (Signed)
Pt will not use call light will not follow redirection jumps out of bed needs to be in camera room or have a Air cabin crew.

## 2016-06-27 NOTE — Progress Notes (Addendum)
Pt SBP exceeding 160 parameter was 176 and just now 194 gave him 10 mg labetalol will recheck BP shortly. BP was 155/80 pulse was 82 will continue to monitor.

## 2016-06-27 NOTE — Progress Notes (Addendum)
PROGRESS NOTE    Gabriel Romero  FIE:[id number] DOB: [birth year] DOA: 06/18/2016 PCP: No PCP Per Patient    Brief Narrative. 66 y.o. obese male with severe hypertension and hx of etoh intake nos. Admitted 3/28/2018with new onset seizures and associated temporal lobe hemorrhage. Chart review shows that even As of 06/19/16 he developed agitated encephalopathy initially needing haldol and precedex gtt. Daughter also described hx of snoring with pauses concerning for undiagnosed OSA. Also rx with cleviprex of high bp. EEG 3/31 with toxic encephalopaty. On 06/21/16 precedex stopped due to bradycardia HR 40-50s and moved to 85M medical floor. Overnight there severe agitation with fall out of bed needing bodyguard rails and increased ativan. PCCM consulted for take over primary service and and agitation management. Neuro elected to stay on as consultant. Patient subsequently transferred to the care of [facility] 06/27/2016   Assessment & Plan:   Principal Problem:   ICH (intracerebral hemorrhage) ([place]) Active Problems:   Seizure ([place])   Alcohol withdrawal syndrome, with delirium ([place])   Smoker   HTN (hypertension)  #1 intracerebral hemorrhage/left temporal lobe hemorrhage Patient had serial head CTs done as well as CT angiogram. Patient was seen in consultation by neurosurgery as well as neurology. Patient improved clinically. Is recommended no intervention needed at this time with no further imaging. Patient likely for rehabilitation. Outpatient follow-up with neurology in 6 weeks.  #2 new onset seizures Likely secondary to intracerebral hemorrhage. Patient with no further seizures at this time. Continue Keppra for seizure prophylaxis. Patient not allowed to drive for at least 6 months. Outpatient follow-up with neurology.  #3 acute encephalopathy/alcohol withdrawal Patient had to be placed on the Precedex drip and managed by critical care medicine. Patient improved clinically and  subsequently weaned off the Precedex drip. She currently on Valium 5 mg by mouth 3 times a day. Critical care medicine recommended prolonged benzodiazepine taper with close monitoring for signs of withdrawal or seizure activity.  #4 hypertension Blood pressure is improving. Continue current dose of Norvasc and follow. Goal systolic blood pressure less than 1 80 mmHg per neurology.  #5 hyperlipidemia Continue statin.  #6 hyponatremia Resolved.  #7 acute renal failure  resolved.  #8 tobacco use disorder Continue nicotine patch.    DVT prophylaxis: SCDs Code Status: Full Family Communication: Updated patient. No family at bedside. Disposition Plan: To skilled nursing facility when bed available.   Consultants:   Neurosurgery Dr. Cyndy Freeze 06/18/2016  PCCM: Dr. Chase Caller 06/22/2016  Inpatient rehabilitation Dr. Naaman Plummer 06/19/2016  Procedures:   2-D echo 06/20/2016  Carotid Dopplers 06/19/2016  CT head 06/18/2016, 06/19/2016, 06/20/2016, 06/23/2016  CT C-spine 06/10/2016   CT angiogram head 06/18/2016  EEG 06/21/2016  Antimicrobials:   None  MICROBIOLOGY: MRSA PCR 3/28: Negative  HIV 3/29: Nonreactive RPR 3/29: Nonreactive   SIGNIFICANT EVENTS: 03/28 - Admit to Stroke Service 04/01 - Transfer to ICU w/ alcohol withdrawal 04/02 - Weaned off Precedex drip  Subjective: Patient sitting up in chair watching television. No chest pain. No shortness of breath.  Objective: Vitals:   06/27/16 0534 06/27/16 0850 06/27/16 1313 06/27/16 1732  BP: (!) 152/79 (!) 135/105 (!) 154/70 (!) 155/77  Pulse: 68 71 71 71  Resp: 20 18 18 18   Temp: 98.8 F (37.1 C) 98.1 F (36.7 C) 97.6 F (36.4 C) 99.2 F (37.3 C)  TempSrc: Oral Oral Oral Oral  SpO2: 95% 100% 97% 98%  Weight:      Height:       No intake  or output data in the 24 hours ending 06/27/16 1917 Filed Weights   06/18/16 1527 06/18/16 1845 06/26/16 0436  Weight: 124.7 kg (275 lb) 90 kg (198 lb 6.6 oz) 93.1  kg (205 lb 4.8 oz)    Examination:  General exam: Appears calm and comfortable  Respiratory system: Clear to auscultation. Respiratory effort normal. Cardiovascular system: S1 & S2 heard, RRR. No JVD, murmurs, rubs, gallops or clicks. No pedal edema. Gastrointestinal system: Abdomen is nondistended, soft and nontender. No organomegaly or masses felt. Normal bowel sounds heard. Central nervous system: Alert and oriented. No focal neurological deficits. Extremities: Symmetric 5 x 5 power. Skin: No rashes, lesions or ulcers Psychiatry: Judgement and insight appear normal. Mood & affect appropriate.     Data Reviewed: I have personally reviewed following labs and imaging studies  CBC:  Recent Labs Lab 06/22/16 1232 06/23/16 0501 06/24/16 0320 06/25/16 0400 06/26/16 0422  WBC 9.3 8.0 9.6 7.9 8.3  NEUTROABS  --  6.2 7.1 5.3 5.5  HGB 13.2 13.0 13.2 12.0* 11.6*  HCT 40.8 39.6 40.7 37.3* 36.1*  MCV 95.6 96.8 96.9 96.1 96.0  PLT 151 162 187 174 876   Basic Metabolic Panel:  Recent Labs Lab 06/22/16 1232 06/23/16 0501 06/24/16 0320 06/25/16 0400 06/26/16 0422 06/27/16 0600 06/27/16 0921  NA 142 147* 143 144 144 143  --   K 3.4* 4.2 3.7 3.5 3.5 3.6  --   CL 115* 116* 116* 116* 112* 109  --   CO2 20* 22 23 22 24 25   --   GLUCOSE 116* 106* 111* 108* 100* 99  --   BUN 16 17 21* 18 16 15   --   CREATININE 1.37* 1.40* 1.37* 1.35* 1.26* 1.26*  --   CALCIUM 8.6* 8.6* 8.6* 8.5* 8.5* 8.8*  --   MG 1.8 2.1 2.0 1.8 1.8  --  1.8  PHOS 3.4 4.2 2.5 3.6  --  3.4  --    GFR: Estimated Creatinine Clearance: 66.1 mL/min (A) (by C-G formula based on SCr of 1.26 mg/dL (H)). Liver Function Tests:  Recent Labs Lab 06/24/16 0320 06/25/16 0400 06/27/16 0600  ALBUMIN 3.4* 3.0* 2.9*   No results for input(s): LIPASE, AMYLASE in the last 168 hours. No results for input(s): AMMONIA in the last 168 hours. Coagulation Profile: No results for input(s): INR, PROTIME in the last 168  hours. Cardiac Enzymes: No results for input(s): CKTOTAL, CKMB, CKMBINDEX, TROPONINI in the last 168 hours. BNP (last 3 results) No results for input(s): PROBNP in the last 8760 hours. HbA1C: No results for input(s): HGBA1C in the last 72 hours. CBG: No results for input(s): GLUCAP in the last 168 hours. Lipid Profile: No results for input(s): CHOL, HDL, LDLCALC, TRIG, CHOLHDL, LDLDIRECT in the last 72 hours. Thyroid Function Tests: No results for input(s): TSH, T4TOTAL, FREET4, T3FREE, THYROIDAB in the last 72 hours. Anemia Panel: No results for input(s): VITAMINB12, FOLATE, FERRITIN, TIBC, IRON, RETICCTPCT in the last 72 hours. Sepsis Labs:  Recent Labs Lab 06/22/16 1232  LATICACIDVEN 0.8    Recent Results (from the past 240 hour(s))  MRSA PCR Screening     Status: None   Collection Time: 06/18/16  7:00 PM  Result Value Ref Range Status   MRSA by PCR NEGATIVE NEGATIVE Final    Comment:        The GeneXpert MRSA Assay (FDA approved for NASAL specimens only), is one component of a comprehensive MRSA colonization surveillance program. It is not intended to  diagnose MRSA infection nor to guide or monitor treatment for MRSA infections.          Radiology Studies: No results found.      Scheduled Meds: . amLODipine  10 mg Oral Daily  . atorvastatin  20 mg Oral q1800  . diazepam  5 mg Oral TID  . folic acid  1 mg Oral Daily  . levETIRAcetam  500 mg Oral BID  . multivitamin  1 tablet Oral Daily  . nicotine  7 mg Transdermal Daily  . pantoprazole  40 mg Oral Daily  . thiamine  100 mg Oral Daily   Continuous Infusions:   LOS: 9 days    Time spent: 40 minutes    Devrin Monforte, MD [facility] Pager [phone]  If 7PM-7AM, please contact night-coverage [email or web address] Password [email or web address] 06/27/2016, 7:17 PM

## 2016-06-28 LAB — RENAL FUNCTION PANEL
ANION GAP: 10 (ref 5–15)
Albumin: 2.8 g/dL — ABNORMAL LOW (ref 3.5–5.0)
BUN: 16 mg/dL (ref 6–20)
CALCIUM: 8.7 mg/dL — AB (ref 8.9–10.3)
CO2: 25 mmol/L (ref 22–32)
Chloride: 108 mmol/L (ref 101–111)
Creatinine, Ser: 1.21 mg/dL (ref 0.61–1.24)
GFR calc non Af Amer: >60 mL/min (ref >60)
Glucose, Bld: 97 mg/dL (ref 65–99)
PHOSPHORUS: 3.7 mg/dL (ref 2.5–4.6)
Potassium: 3.6 mmol/L (ref 3.5–5.1)
SODIUM: 143 mmol/L (ref 135–145)

## 2016-06-28 MED ORDER — THIAMINE HCL 100 MG PO TABS: 100.0000 mg | ORAL_TABLET | Freq: Every day | ORAL | Status: DC

## 2016-06-28 MED ORDER — PANTOPRAZOLE SODIUM 40 MG PO TBEC: 40.0000 mg | DELAYED_RELEASE_TABLET | Freq: Every day | ORAL | 1 refills | Status: AC

## 2016-06-28 MED ORDER — AMLODIPINE BESYLATE 10 MG PO TABS: 10.0000 mg | ORAL_TABLET | Freq: Every day | ORAL | 1 refills | Status: DC

## 2016-06-28 MED ORDER — PROSIGHT PO TABS: 1.0000 | ORAL_TABLET | Freq: Every day | ORAL | 0 refills | Status: DC

## 2016-06-28 MED ORDER — FOLIC ACID 1 MG PO TABS: 1.0000 mg | ORAL_TABLET | Freq: Every day | ORAL | Status: DC

## 2016-06-28 MED ORDER — ATORVASTATIN CALCIUM 20 MG PO TABS: 20.0000 mg | ORAL_TABLET | Freq: Every day | ORAL | 1 refills | Status: AC

## 2016-06-28 MED ORDER — DIAZEPAM 5 MG PO TABS: 5.0000 mg | ORAL_TABLET | Freq: Three times a day (TID) | ORAL | 0 refills | Status: DC

## 2016-06-28 MED ORDER — NICOTINE 7 MG/24HR TD PT24: 7.0000 mg | MEDICATED_PATCH | Freq: Every day | TRANSDERMAL | 0 refills | Status: DC

## 2016-06-28 MED ORDER — LEVETIRACETAM 500 MG PO TABS: 500.0000 mg | ORAL_TABLET | Freq: Two times a day (BID) | ORAL | 1 refills | Status: DC

## 2016-06-28 NOTE — Progress Notes (Addendum)
Reviewed case with medical director. Instructed to offer LOG for SNF contingent on patient agreeing to sign over SS/ Disability/ Income until insurance approved. Patient adamantly refused to surrender income for LOG. Patient also refusing all HH. CM explained to attending that all offers to establish additional supervision and care post discharge have been offered and patient has refused. CM left messages for call back on voicemail to son and daughter to discuss discharge today. No calls returned as of yet.  15:10 Spoke with son Mikki Santee. Explained options offered to patient for safe disposition that were refused. Explained to Mikki Santee that [facility] could be set up in the out patient realm through a PCP. Included [place] [facility] as a resource for PCP after DC. Mikki Santee understood to expect DC as soon as later today. Provided with RN station number to call by 5:00 if he does not hear update.

## 2016-06-28 NOTE — Discharge Instructions (Signed)
Fall Prevention in the Home Falls can cause injuries. They can happen to people of all ages. There are many things you can do to make your home safe and to help prevent falls. What can I do on the outside of my home?  Regularly fix the edges of walkways and driveways and fix any cracks.  Remove anything that might make you trip as you walk through a door, such as a raised step or threshold.  Trim any bushes or trees on the path to your home.  Use bright outdoor lighting.  Clear any walking paths of anything that might make someone trip, such as rocks or tools.  Regularly check to see if handrails are loose or broken. Make sure that both sides of any steps have handrails.  Any raised decks and porches should have guardrails on the edges.  Have any leaves, snow, or ice cleared regularly.  Use sand or salt on walking paths during winter.  Clean up any spills in your garage right away. This includes oil or grease spills. What can I do in the bathroom?  Use night lights.  Install grab bars by the toilet and in the tub and shower. Do not use towel bars as grab bars.  Use non-skid mats or decals in the tub or shower.  If you need to sit down in the shower, use a plastic, non-slip stool.  Keep the floor dry. Clean up any water that spills on the floor as soon as it happens.  Remove soap buildup in the tub or shower regularly.  Attach bath mats securely with double-sided non-slip rug tape.  Do not have throw rugs and other things on the floor that can make you trip. What can I do in the bedroom?  Use night lights.  Make sure that you have a light by your bed that is easy to reach.  Do not use any sheets or blankets that are too big for your bed. They should not hang down onto the floor.  Have a firm chair that has side arms. You can use this for support while you get dressed.  Do not have throw rugs and other things on the floor that can make you trip. What can I do in the  kitchen?  Clean up any spills right away.  Avoid walking on wet floors.  Keep items that you use a lot in easy-to-reach places.  If you need to reach something above you, use a strong step stool that has a grab bar.  Keep electrical cords out of the way.  Do not use floor polish or wax that makes floors slippery. If you must use wax, use non-skid floor wax.  Do not have throw rugs and other things on the floor that can make you trip. What can I do with my stairs?  Do not leave any items on the stairs.  Make sure that there are handrails on both sides of the stairs and use them. Fix handrails that are broken or loose. Make sure that handrails are as long as the stairways.  Check any carpeting to make sure that it is firmly attached to the stairs. Fix any carpet that is loose or worn.  Avoid having throw rugs at the top or bottom of the stairs. If you do have throw rugs, attach them to the floor with carpet tape.  Make sure that you have a light switch at the top of the stairs and the bottom of the stairs. If you do   not have them, ask someone to add them for you. What else can I do to help prevent falls?  Wear shoes that:  Do not have high heels.  Have rubber bottoms.  Are comfortable and fit you well.  Are closed at the toe. Do not wear sandals.  If you use a stepladder:  Make sure that it is fully opened. Do not climb a closed stepladder.  Make sure that both sides of the stepladder are locked into place.  Ask someone to hold it for you, if possible.  Clearly mark and make sure that you can see:  Any grab bars or handrails.  First and last steps.  Where the edge of each step is.  Use tools that help you move around (mobility aids) if they are needed. These include:  Canes.  Walkers.  Scooters.  Crutches.  Turn on the lights when you go into a dark area. Replace any light bulbs as soon as they burn out.  Set up your furniture so you have a clear path.  Avoid moving your furniture around.  If any of your floors are uneven, fix them.  If there are any pets around you, be aware of where they are.  Review your medicines with your doctor. Some medicines can make you feel dizzy. This can increase your chance of falling. Ask your doctor what other things that you can do to help prevent falls. This information is not intended to replace advice given to you by your health care provider. Make sure you discuss any questions you have with your health care provider. Document Released: 01/04/2009 Document Revised: 08/16/2015 Document Reviewed: 04/14/2014 Elsevier Interactive Patient Education  2017 Elsevier Inc.  

## 2016-06-28 NOTE — Progress Notes (Signed)
qPhysical Therapy Treatment Patient Details Name: Gabriel Romero MRN: [id number] DOB: 1951-01-02 Today's Date: 06/28/2016    History of Present Illness Pt is a 66 y.o. male admitted to ED on 06/18/16 with new onset seizures after a fall. CT shows acute hemorrhage in R lateral temporal lobe; C-spine clear. Pertinent PMH includes severe HTN, alcohol use.      PT Comments    Pt presenting with both cognitive and functional deficits and is at a high falls risk. Pt with impaired sequencing, delayed processing, perseveration with tangential speech, minimal executive functional, decreased safety awareness and decreased insight to deficits. 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 Pt now with significant balance impairment as noted by score of 9 on DGI, and shuffled gait pattern with increased trunk flexion. Pt an excellent candidate for CIR as pt was indep and working PTA and is very motivated to improve and return home. Spoke with Genie from [employer] and she reports she will re-evaluate him on Monday. Pt is UNSAFE to return home at this time due to above mentioned deficits, the chance of patient falling again is very high, AND no 24/7 supervision available.    Follow Up Recommendations  CIR;Supervision/Assistance - 24 hour     Equipment Recommendations       Recommendations for Other Services Rehab consult     Precautions / Restrictions Precautions Precautions: Fall Precaution Comments: decreased safety awarenses and insight to deficits Restrictions Weight Bearing Restrictions: No    Mobility  Bed Mobility               General bed mobility comments: pt up in chair upon PT arrival  Transfers Overall transfer level: Needs assistance Equipment used: None Transfers: Sit to/from Stand Sit to Stand: Min guard         General transfer comment: v/c's for hand placement and min guard for safety  Ambulation/Gait Ambulation/Gait assistance:  Min assist Ambulation Distance (Feet): 150 Feet Assistive device: None Gait Pattern/deviations: Step-to pattern;Decreased stride length;Shuffle;Trunk flexed;Narrow base of support Gait velocity: slow Gait velocity interpretation: Below normal speed for age/gender General Gait Details: pt unable to maintain upright posture > 3 steps despite max verbal and tactile cues pt demo'd progressive trunk flexion and inability to clear feet. pt with shuffled gait pattern and unable to pick up feet to step. Pt unable to change speed or step over objects without LOB   Stairs Stairs: Yes   Stair Management: Two rails;Alternating pattern Number of Stairs: 2 General stair comments: dependent on UEs to pull self up, guarded and cautious going down due to weakness and impaired balance  Wheelchair Mobility    Modified Rankin (Stroke Patients Only) Modified Rankin (Stroke Patients Only) Pre-Morbid Rankin Score: No symptoms Modified Rankin: Moderately severe disability     Balance Overall balance assessment: Needs assistance Sitting-balance support: No upper extremity supported Sitting balance-Leahy Scale: Good     Standing balance support: No upper extremity supported Standing balance-Leahy Scale: Fair Standing balance comment: requires physical assist during funtional tasks                 Standardized Balance Assessment Standardized Balance Assessment : Dynamic Gait Index   Dynamic Gait Index Level Surface: Mild Impairment Change in Gait Speed: Moderate Impairment Gait with Horizontal Head Turns: Moderate Impairment Gait with Vertical Head Turns: Moderate Impairment Gait and Pivot Turn: Mild Impairment Step Over Obstacle: Severe Impairment Step Around Obstacles: Moderate Impairment Steps: Moderate Impairment Total  Score: 9      Cognition Arousal/Alertness: Awake/alert Behavior During Therapy: WFL for tasks assessed/performed Overall Cognitive Status: Impaired/Different from  baseline Area of Impairment: Attention;Safety/judgement;Memory                 Orientation Level: Disoriented to;Situation Current Attention Level: Sustained Memory: Decreased short-term memory   Safety/Judgement: Decreased awareness of safety;Decreased awareness of deficits Awareness: Emergent Problem Solving: Slow processing;Difficulty sequencing;Requires verbal cues;Requires tactile cues General Comments: Pt easily distracted with tangental speech. Pt with poor attention span and difficulty following converstation and staying on task. Pt with poor executive functioning. Pt stating his balance is off and his legs are really weak however denying home health PT stating his son and him will go to the gym and he will walk .25 miles to [facility] daily. Pt not realistic and with poor comprehension of deficits and seriousness of his ICH.      Exercises      General Comments General comments (skin integrity, edema, etc.): pt perseverating on how he is going to quit drinking so much but he can't stop smoking      Pertinent Vitals/Pain Pain Assessment: No/denies pain    Home Living                      Prior Function            PT Goals (current goals can now be found in the care plan section) Acute Rehab PT Goals Patient Stated Goal: return home Progress towards PT goals: Progressing toward goals    Frequency    Min 4X/week      PT Plan Current plan remains appropriate    Co-evaluation             End of Session Equipment Utilized During Treatment: Gait belt Activity Tolerance: Patient tolerated treatment well Patient left: in chair;with call bell/phone within reach;with chair alarm set Nurse Communication: Mobility status PT Visit Diagnosis: Unsteadiness on feet (R26.81);Other abnormalities of gait and mobility (R26.89);Muscle weakness (generalized) (M62.81)     Time: 4481-8563 PT Time Calculation (min) (ACUTE ONLY): 30 min  Charges:  $Gait Training:  23-37 mins                    G Codes:       Kittie Plater, PT, DPT Pager #: [phone] Office #: [phone]    [place] 06/28/2016, 2:53 PM

## 2016-06-28 NOTE — Clinical Social Work Note (Addendum)
CSW left voicemail for admissions coordinator at [facility]. Waiting to see if they received authorization from [facility]. If not, patient will likely need to discharge home with home health because a determination will not be made until Monday.  Dayton Scrape, [place] [phone]  10:24 am [facility] did not get response on authorization from [facility]. CSW and RNCM met with patient to discuss home health. Patient does not think he needs home health and does not think he needs any equipment.   CSW signing off.  Dayton Scrape, [place] [phone]  2:52 pm Medical director of social work/case management department offered LOG for patient if he is agreeable [place] of county for SNF and is agreeable to signing over his social security check. CSW and RNCM met with patient. He is now refusing SNF and states he wants to return home without home health. Patient stated that he cannot afford to lose his check. Patient continues to be focused on getting disability. RNCM left voicemails for patient's children and has discussed with MD.  Dayton Scrape, [place] [phone]

## 2016-06-28 NOTE — Care Management Note (Addendum)
Case Management Note  Patient Details  Name: Gabriel Romero MRN: [id number] Date of Birth: 04-03-1950  Subjective/Objective:                Spoke with patient at the bedside, along with Gabriel Romero CSW. Patient has not received approval for SNF, HH offered. Patient declined [place] necessity stating he has been walking w/o assist and regained strength in his arms stating he has been working on his strength while inpatient. He also declined offer for DME. He lives at home with son. CM notified Dr Grandville Silos of above and patient stating readiness to DC today.  13:30 Spoke with son Gabriel Romero over the phone. Explained that patient has not received approval for SNF, approval not likely, and we would not hear back until Monday. Patient ambulating 250 feet 06/26/16. Patient wanting to go home. Patient also declining HH. Son states that patient lives with him, and that he works during the day, thus leaving patient alone.  CM explained that patient would likely be discharged soon, and for his safety should have 24 hour supervision. CM explained assistance that could be offered from private duty caregivers vs [facility] if he could get his Dad to agree to [facility]. Strongly suggested he start to look into arranging supervision for his father.  15:55 Per Dr Grandville Silos patient agreed to [facility]. Referral made to [facility], and accepted. Gabriel Romero,Gabriel Romero Son   [phone]      Action/Plan:  Anticipate DC to home today.  Expected Discharge Date:                  Expected Discharge Plan:  Home/Self Care  In-House Referral:  Clinical Social Work  Discharge planning Services  CM Consult  Post Acute Care Choice:    Choice offered to:  Patient  DME Arranged:    DME Agency:     HH Arranged:    [place] Agency:     Status of Service:  Completed, signed off  If discussed at [employer], dates discussed:    Additional Comments:  Carles Collet, RN 06/28/2016, 10:29 AM

## 2016-06-28 NOTE — Progress Notes (Signed)
Patient very impulsive, has gotten out of chair 6 times this shift without calling for staff assistance. Call bell explained to patient multiple times. Patient still does not demonstrate correct usage of call bell

## 2016-06-28 NOTE — Discharge Summary (Signed)
Physician Discharge Summary  Gabriel Romero NWG:[id number] DOB: 1950-04-16 DOA: 06/18/2016  PCP: No PCP Per Patient  Admit date: 06/18/2016 Discharge date: 06/28/2016  Time spent: 65 minutes  Recommendations for Outpatient Follow-up:  1. Patient was given information for follow-up with [place] to establish PCP. All follow-up with PCP patient will need a basic metabolic profile done to follow-up on electrolytes and renal function. Patient also need risk factor modification in terms of tobacco cessation, better blood pressure control, treatment of hyperlipidemia. 2. Follow-up with Dr. Leonie Man, neurology in 6 weeks.   Discharge Diagnoses:  Principal Problem:   ICH (intracerebral hemorrhage) ([place]) Active Problems:   Seizure ([place])   Alcohol withdrawal syndrome, with delirium ([place])   Smoker   HTN (hypertension)   Discharge Condition: Stable and improved  Diet recommendation: Heart healthy  Filed Weights   06/18/16 1527 06/18/16 1845 06/26/16 0436  Weight: 124.7 kg (275 lb) 90 kg (198 lb 6.6 oz) 93.1 kg (205 lb 4.8 oz)    History of present illness:  Per Dr Marcha Dutton is a 66 y.o. male with a history of severe hypertension who presented with new onset seizures in the setting of a typical hematoma. He did fall earlier in the day, but does not clear if he hit his head, there is no report of loss of consciousness. He then had sudden onset seizure activity was brought in via EMS. Head CT revealled a temporal lobe hemorrhage.   LKW: Earlier today, I'm not certain what time tpa given?: no, ICH ICH Score: 1 Patient subsequently admitted to the ICU on the neurology service.   Hospital Course:  Brief Narrative. 66 y.o.obese male with severe hypertension and hx of etoh intake nos. Admitted 3/28/2018with new onset seizures and associated temporal lobe hemorrhage. Chart review shows that even As of 06/19/16 he developed agitated encephalopathy initially  needing haldol and precedex gtt. Daughter also described hx of snoring with pauses concerning for undiagnosed OSA. Also rx with cleviprex of high bp. EEG 3/31 with toxic encephalopaty. On 06/21/16 precedex stopped due to bradycardia HR 40-50s and moved to 65M medical floor. Overnight there severe agitation with fall out of bed needing bodyguard rails and increased ativan. PCCM consulted for take over primary service and and agitation management. Neuro elected to stay on as consultant. Patient subsequently transferred to the care of [facility] 06/27/2016  #1 intracerebral hemorrhage/left temporal lobe hemorrhage Patient noted on admission to have a temporal lobe intraparenchymal hematoma. Unknown etiology of hematoma whether secondary to fall, tumor, aneurysm or AVM. CT head which was obtained on admission did show a right lateral temporal lobe acute hemorrhage with no surrounding edema or mass lesion. Hypodensity in the right parietal lobe most likely secondary to chronic infarct. CT angiogram of the brain was negative for AVMs. CT angiogram also did note at the room at his plaque involving the carotid siphons with moderate to advanced multifocal narrowing approximately 50-75%, left greater than right. Patient had repeat head CTs done which showed stabilization of temporal lobe hematoma. Carotid Dopplers done were consistent with a high and 60-79% stenosis involving the proximal left ICA. 2-D echo done at a EF of 40-45% with no cardiac source of emboli. Fasting lipid panel done had a LDL of 123 and patient was started on a statin. Hemoglobin A1c was 5.6. Patient was seen by PT/ OT/ ST. Initial recommendations were for inpatient rehabilitation however evaluation of inpatient rehabilitation M.D. patient was deemed to be too functional and  recommended skilled nursing facility placement.  Patient had serial head CTs done as well as CT angiogram which remained stable. Patient was seen in consultation by  neurosurgery as well as neurology. Patient improved clinically. It was recommended no intervention needed at this time with no further imaging. Patient was awaiting insurance approval for skilled nursing facility and offered LOG for skilled nursing facility continued on patient agreed to sign over Social Security/disability/income until insurance approved which patient adamantly refused to surrender income for LOG. Patient was offered safe disposition stat he refused. Home health services were offered to patient which she initially refused as he felt his insurance would not cover it. Patient was assured that his insurance should cover his home health services and a such patient agreed to home health services. Patient be discharged home with home health PT/OT/ST/NA/SW. Patient will follow-up with neurology 6 weeks post discharge. Patient will also follow-up with PCP.   #2 new onset seizures Likely secondary to intracerebral hemorrhage. Patient with no further seizures at this time. Patient was placed on Keppra on admission. EEG obtained showed mild general slowing with no seizures. TSH was normal, HIV and RPR were nonreactive, patient had no signs or symptoms of infection. UDS was positive for benzos. Alcohol level on admission was less than 5. Continued on Keppra for seizure prophylaxis. Patient not allowed to drive for at least 6 months. Outpatient follow-up with neurology.  #3 acute encephalopathy/alcohol withdrawal Patient had to be placed on the Precedex drip and managed by critical care medicine due to agitation and acute encephalopathy felt likely secondary to alcohol withdrawal. Patient improved clinically and subsequently weaned off the Precedex drip. Patient was subsequently placed on  Valium 5 mg by mouth 3 times a day. Critical care medicine recommended prolonged benzodiazepine taper with close monitoring for signs of withdrawal or seizure activity. Patient be discharged home on Valium 5 mg daily  3 days for 1 week and then Valium 5 mg twice a day for 1 week and the Valium 5 mg daily and then per PCP on outpatient follow-up.  #4 hypertension Patient was noted to be hypertensive on admission. Due to patient's intracranial bleed permissive hypertension was done during the hospitalization. Patient's blood pressure was slowly lowered. Patient was started on Norvasc 10 mg daily with goal systolic blood pressure less than 180 per neurology recommendations. Outpatient follow-up with further blood pressure control over the next 1-2 weeks.   #5 hyperlipidemia Fasting lipid panel obtained at a total cholesterol of 205, LDL of 123. Patient was placed on a statin. Outpatient follow-up.  #6 hyponatremia Resolved with hydration.  #7 acute renal failure   resolved with hydration.  #8 tobacco use disorder Patient was placed on a nicotine patch.     Procedures:  2-D echo 06/20/2016  Carotid Dopplers 06/19/2016  CT head 06/18/2016, 06/19/2016, 06/20/2016, 06/23/2016  CT C-spine 06/10/2016   CT angiogram head 06/18/2016  EEG 06/21/2016  Consultations:  Neurosurgery Dr. Cyndy Freeze 06/18/2016  PCCM: Dr. Chase Caller 06/22/2016  Inpatient rehabilitation Dr. Naaman Plummer 06/19/2016   Discharge Exam: Vitals:   06/28/16 1437 06/28/16 1455  BP: (!) 169/76 (!) 153/74  Pulse:    Resp:    Temp:      General: NAD Cardiovascular: RRR Respiratory: CTAB  Discharge Instructions   Discharge Instructions    Ambulatory referral to Neurology    Complete by:  As directed    An appointment is requested in approximately: 6 weeks.   Diet - low sodium heart healthy  Complete by:  As directed    Discharge instructions    Complete by:  As directed    No driving for at least 6 months, and until cleared by a neurologist. Stop smoking.  Stop drinking alcohol   Increase activity slowly    Complete by:  As directed      Current Discharge Medication List    START taking these  medications   Details  amLODipine (NORVASC) 10 MG tablet Take 1 tablet (10 mg total) by mouth daily. Qty: 30 tablet, Refills: 1    atorvastatin (LIPITOR) 20 MG tablet Take 1 tablet (20 mg total) by mouth daily at 6 PM. Qty: 30 tablet, Refills: 1    diazepam (VALIUM) 5 MG tablet Take 1 tablet (5 mg total) by mouth 3 (three) times daily. Take 1 tablet 3 times daily x 1 week, then 1 tablet 2 times daily x 1 week, then 1 tablet daily, then as per PCP. Qty: 42 tablet, Refills: 0    folic acid (FOLVITE) 1 MG tablet Take 1 tablet (1 mg total) by mouth daily.    levETIRAcetam (KEPPRA) 500 MG tablet Take 1 tablet (500 mg total) by mouth 2 (two) times daily. Qty: 60 tablet, Refills: 1    multivitamin (PROSIGHT) TABS tablet Take 1 tablet by mouth daily. Qty: 30 each, Refills: 0    nicotine (NICODERM CQ - DOSED IN MG/24 HR) 7 mg/24hr patch Place 1 patch (7 mg total) onto the skin daily. Qty: 28 patch, Refills: 0    pantoprazole (PROTONIX) 40 MG tablet Take 1 tablet (40 mg total) by mouth daily. Qty: 30 tablet, Refills: 1    thiamine 100 MG tablet Take 1 tablet (100 mg total) by mouth daily.       Allergies  Allergen Reactions  . Sulfa Antibiotics Rash    Contact information for follow-up providers    [facility] Follow up.   Why:  To establish PCP.  Contact information: [place], [place] [address] [phone]       SETHI,PRAMOD, MD. Schedule an appointment as soon as possible for a visit in 6 week(s).   Specialties:  Neurology, Radiology Contact information: [address] [place] [address] [place] Follow up.   Why:  home health to start 1-2 days after discharge, they will call to set up first home visit Contact information: [address] [place] [address] [phone]            Contact information for after-discharge care    Destination     [facility] .   Specialty:  Skilled Nursing Facility Contact information: [place] [phone]                   The results of significant diagnostics from this hospitalization (including imaging, microbiology, ancillary and laboratory) are listed below for reference.    Significant Diagnostic Studies: Ct Angio Head W Or Wo Contrast  Result Date: 06/18/2016 CLINICAL DATA:  Initial evaluation for acute intracranial hemorrhage. EXAM: CT ANGIOGRAPHY HEAD TECHNIQUE: Multidetector CT imaging of the head was performed using the standard protocol during bolus administration of intravenous contrast. Multiplanar CT image reconstructions and MIPs were obtained to evaluate the vascular anatomy. CONTRAST:  50 cc of Isovue 370. COMPARISON:  Prior CT from earlier the same day. FINDINGS: CTA HEAD Anterior circulation: Distal cervical segments  of the internal carotid arteries are well opacified and widely patent. Petrous segments widely patent bilaterally. Scattered calcified atheromatous plaque throughout the cavernous/ supraclinoid ICAs. Resultant moderate to advanced multifocal narrowing (approximately 50-75%), slightly worse to the left. Note made of a focal moderate stenosis of approximately 50% within the proximal cavernous left ICA (series 502, image 46). The ICA termini widely patent bilaterally. A1 segments widely patent. Anterior communicating artery normal. Anterior cerebral arteries patent to their distal aspects. M1 segments patent without stenosis or occlusion. No proximal M2 occlusion. Distal MCA branches well opacified and symmetric. No vascular abnormality seen underlying the peripheral right temporal lobe hemorrhage. Posterior circulation: Visualized vertebral arteries widely patent to the vertebrobasilar junction. Left PICA patent proximally. Right PICA not well visualized. Basilar artery widely patent to its distal aspect. Superior  cerebral arteries patent bilaterally. Both of the posterior cerebral arteries primarily supplied via the basilar and are widely patent to their distal aspects. Venous sinuses: Not well evaluated on this exam due to timing of the contrast bolus. No obvious venous sinus thrombosis. Anatomic variants: No significant anatomic variant. No aneurysm or vascular malformation. No abnormality seen underlying the right temporal hemorrhage. Delayed phase: No pathologic enhancement. Previously identified hemorrhage within the right temporal lobe is relatively stable measuring 2.6 x 1.7 x 1.8 cm. IMPRESSION: 1. Negative CTA of the brain. No acute vascular abnormality identified. No AVM or other vascular abnormality seen underlying the right temporal lobe hemorrhage. 2. Atheromatous plaque involving the carotid siphons with moderate to advanced multifocal narrowing (approximately 50-75%), left greater than right. 3. No significant interval change in size and appearance of right temporal lobe hemorrhage, measuring 2.6 x 1.7 x 1.8 cm. No significant mass effect. Electronically Signed   By: Jeannine Boga M.D.   On: 06/18/2016 19:14   Ct Head Wo Contrast  Result Date: 06/23/2016 CLINICAL DATA:  Intracranial hemorrhage EXAM: CT HEAD WITHOUT CONTRAST TECHNIQUE: Contiguous axial images were obtained from the base of the skull through the vertex without intravenous contrast. COMPARISON:  Head CT 06/20/2016 FINDINGS: Brain: Intraparenchymal hematoma within the anterior right temporal lobe measures 2.4 x 1.6 x 1.2 cm (AP x Transverse x CC). The size is unchanged. No new areas of hemorrhage are identified. There is no extra-axial collection. No midline shift or other mass effect. Size and configuration of the ventricles are unchanged. There is periventricular hypoattenuation compatible with chronic microvascular disease. There are old right basal ganglia and right parietal lobe infarcts, unchanged. Vascular: Atherosclerotic  calcification of the internal carotid arteries at the skull base. Skull: Unchanged nasal bone fractures. Visualized skull base is normal. Sinuses/Orbits: The visualized portions of the paranasal sinuses and mastoid air cells are free of fluid. No advanced mucosal thickening. The visualized orbits are normal. Other: None IMPRESSION: 1. Unchanged size of anterior right temporal lobe intraparenchymal hematoma without new area of hemorrhage or mass effect. 2. Advanced chronic microvascular ischemia and old infarcts. Electronically Signed   By: Ulyses Jarred M.D.   On: 06/23/2016 15:19   Ct Head Wo Contrast  Result Date: 06/20/2016 CLINICAL DATA:  66 y/o  M; intracranial hemorrhage for follow-up. EXAM: CT HEAD WITHOUT CONTRAST TECHNIQUE: Contiguous axial images were obtained from the base of the skull through the vertex without intravenous contrast. COMPARISON:  06/19/2015 CT of the head. FINDINGS: Brain: Stable hematoma within the right temporal lobe. No evidence for new large territory infarct, acute intracranial hemorrhage, or focal mass effect. Stable advanced chronic microvascular ischemic changes of white matter, chronic right  parietal infarct, multiple small chronic lacunar infarcts within the basal ganglia, and brain parenchymal volume loss. No hydrocephalus. No effacement of basilar cisterns. Vascular: Severe calcific atherosclerosis of cavernous and paraclinoid internal carotid arteries. Skull: Mildly displaced acute fracture of the nasal bones. No calvarial fracture. Sinuses/Orbits: Chronic left lamina papyracea fracture with mild medial displacement of the medial rectus muscle. Patchy ethmoid sinus mucosal thickening and left maxillary sinus mucous retention cyst. Periapical cysts of multiple maxillary teeth. Other: None. IMPRESSION: 1. Stable right anterolateral temporal lobe hematoma. 2. Stable advanced chronic microvascular ischemic changes, chronic infarctions, and volume loss of the brain. 3. Stable  mildly displaced acute nasal bone fractures. 4. No new acute infarct or hemorrhage identified. Electronically Signed   By: Kristine Garbe M.D.   On: 06/20/2016 06:19   Ct Head Wo Contrast  Result Date: 06/19/2016 CLINICAL DATA:  Follow-up of intracranial hemorrhage. EXAM: CT HEAD WITHOUT CONTRAST TECHNIQUE: Contiguous axial images were obtained from the base of the skull through the vertex without intravenous contrast. COMPARISON:  CT angiogram and CT of the head dated 06/18/2016. FINDINGS: Brain: Stable hematoma within the right temporal lobe. No new acute large territory infarct, intracranial hemorrhage, or focal mass effect identified. Advanced chronic microvascular ischemic changes of white matter, chronic right parietal infarct, multiple small chronic lacunar infarcts within the basal ganglia, and mild brain parenchymal volume loss are stable. Stable ventricle size. Vascular: Extensive calcific atherosclerosis of cavernous and paraclinoid internal carotid arteries. Skull: Mildly displaced acute fracture deformity of the nasal bones with mild displacement. No displaced calvarial fracture. Sinuses/Orbits: Mild diffuse ethmoid sinus mucosal thickening and mucous retention cysts within the left maxillary sinus. Chronic left lamina papyracea fracture with herniation of extra orbital fat and mild medial displacement of the medial rectus muscle. Other: Periapical lucencies of multiple teeth in the maxillary alveolar bone compatible with odontogenic disease. IMPRESSION: 1. Stable acute hemorrhage within the right lateral temporal lobe. This area is atypical for hypertensive hemorrhage and traumatic hemorrhagic contusion or underlying hemorrhagic mass should be considered. 2. Stable advanced chronic microvascular ischemic changes of the brain, chronic infarcts, and volume loss. 3. Acute appearing mildly displaced nasal bone fractures and chronic left lamina papyracea fracture are stable. Electronically  Signed   By: Kristine Garbe M.D.   On: 06/19/2016 00:35   Ct Head Wo Contrast  Result Date: 06/18/2016 CLINICAL DATA:  Altered mental status.  Seizure activity. EXAM: CT HEAD WITHOUT CONTRAST TECHNIQUE: Contiguous axial images were obtained from the base of the skull through the vertex without intravenous contrast. COMPARISON:  None. FINDINGS: Brain: Acute hemorrhage in the right lateral temporal lobe measures 27 x 19 x 13 mm. Hemorrhage volume 3.3 mL. No significant surrounding edema or mass. Mild atrophy without hydrocephalus. Hypodensity throughout the cerebral white matter bilaterally most likely chronic microvascular ischemia. Hypodensity in the right parietal lobe involving white matter and cortex. This is most compatible with infarction, probably old but difficult to state with certainty. No other areas of hemorrhage. No shift of the midline structures. No subarachnoid hemorrhage or subdural hemorrhage. Vascular: Arterial calcification in the cavernous carotid bilaterally. Negative for hyperdense vessel. Skull: Negative for skull fracture. No significant scalp soft tissue swelling. Chronic fracture left medial orbit. Sinuses/Orbits: Mucosal edema in the paranasal sinuses. Other: None IMPRESSION: Small area of acute hemorrhage in the right lateral temporal lobe. Hemorrhage volume 3.3 mL. No significant surrounding edema or mass lesion. Differential considerations would include a traumatic hemorrhagic contusion. Venous infarction is possible. Hemorrhagic mass or infarct  are consider less likely. Further evaluation with MRI and CTA suggested. Generalized atrophy with diffuse white matter disease most likely chronic. Hypodensity in the right parietal lobe most likely a chronic infarct. These results were called by telephone at the time of interpretation on 06/18/2016 at 3:59 pm to Dr. Zenovia Jarred , who verbally acknowledged these results. Electronically Signed   By: Franchot Gallo M.D.   On:  06/18/2016 16:00   Ct Cervical Spine Wo Contrast  Result Date: 06/18/2016 CLINICAL DATA:  Combative with seizure-like activity at work today. EXAM: CT CERVICAL SPINE WITHOUT CONTRAST TECHNIQUE: Multidetector CT imaging of the cervical spine was performed without intravenous contrast. Multiplanar CT image reconstructions were also generated. COMPARISON:  None. FINDINGS: Despite efforts by the technologist and patient, mild motion artifact is present on today's exam and could not be eliminated. This reduces exam sensitivity and specificity. Alignment: Straightening without focal angulation or listhesis. Vertebrae: No evidence of acute fracture or traumatic subluxation. No focal osseous lesions are seen. Paraspinal tissues: No paraspinal inflammatory changes or fluid collections are demonstrated. There is carotid atherosclerosis bilaterally. Disc levels: There is multilevel spondylosis with disc space narrowing, uncinate spurring and facet hypertrophy. These findings contribute to mild to moderate spinal stenosis at C5-6 and C6-7. In addition, there is moderate osseous foraminal narrowing bilaterally from C3-4 through C5-6. No acute soft disc herniation noted. IMPRESSION: No evidence of acute cervical spine fracture, traumatic subluxation or static signs of instability. Multilevel spondylosis, contributing to spinal and foraminal narrowing as described. Electronically Signed   By: Richardean Sale M.D.   On: 06/18/2016 16:02    Microbiology: Recent Results (from the past 240 hour(s))  MRSA PCR Screening     Status: None   Collection Time: 06/18/16  7:00 PM  Result Value Ref Range Status   MRSA by PCR NEGATIVE NEGATIVE Final    Comment:        The GeneXpert MRSA Assay (FDA approved for NASAL specimens only), is one component of a comprehensive MRSA colonization surveillance program. It is not intended to diagnose MRSA infection nor to guide or monitor treatment for MRSA infections.       Labs: Basic Metabolic Panel:  Recent Labs Lab 06/23/16 0501 06/24/16 0320 06/25/16 0400 06/26/16 0422 06/27/16 0600 06/27/16 0921 06/28/16 0513  NA 147* 143 144 144 143  --  143  K 4.2 3.7 3.5 3.5 3.6  --  3.6  CL 116* 116* 116* 112* 109  --  108  CO2 22 23 22 24 25   --  25  GLUCOSE 106* 111* 108* 100* 99  --  97  BUN 17 21* 18 16 15   --  16  CREATININE 1.40* 1.37* 1.35* 1.26* 1.26*  --  1.21  CALCIUM 8.6* 8.6* 8.5* 8.5* 8.8*  --  8.7*  MG 2.1 2.0 1.8 1.8  --  1.8  --   PHOS 4.2 2.5 3.6  --  3.4  --  3.7   Liver Function Tests:  Recent Labs Lab 06/24/16 0320 06/25/16 0400 06/27/16 0600 06/28/16 0513  ALBUMIN 3.4* 3.0* 2.9* 2.8*   No results for input(s): LIPASE, AMYLASE in the last 168 hours. No results for input(s): AMMONIA in the last 168 hours. CBC:  Recent Labs Lab 06/22/16 1232 06/23/16 0501 06/24/16 0320 06/25/16 0400 06/26/16 0422  WBC 9.3 8.0 9.6 7.9 8.3  NEUTROABS  --  6.2 7.1 5.3 5.5  HGB 13.2 13.0 13.2 12.0* 11.6*  HCT 40.8 39.6 40.7 37.3* 36.1*  MCV  95.6 96.8 96.9 96.1 96.0  PLT 151 162 187 174 184   Cardiac Enzymes: No results for input(s): CKTOTAL, CKMB, CKMBINDEX, TROPONINI in the last 168 hours. BNP: BNP (last 3 results) No results for input(s): BNP in the last 8760 hours.  ProBNP (last 3 results) No results for input(s): PROBNP in the last 8760 hours.  CBG: No results for input(s): GLUCAP in the last 168 hours.     SignedIrine Seal MD.  [facility] 06/28/2016, 4:17 PM

## 2016-06-28 NOTE — Progress Notes (Addendum)
RN discussed discharge instructions with patient and patient's son. Patient's son vocalized understanding of medications, fall risk prevention. Neuro assessment unchanged. Understands importance of medication compliance and follow up with pcp, neurology.   Per Clarise Cruz, case mgmt, she will call patient's son tomorrow and set up home health, instruct on disability

## 2016-06-29 NOTE — Progress Notes (Signed)
CM received call from RN (after hours) to please set up with [facility] for H services.  CM checked for orders this am and called the referral to [facility] rep, Jermaine. No other Cm needs were communciated.

## 2016-06-30 DIAGNOSIS — S81802D Unspecified open wound, left lower leg, subsequent encounter: Secondary | ICD-10-CM | POA: Diagnosis not present

## 2016-06-30 DIAGNOSIS — I10 Essential (primary) hypertension: Secondary | ICD-10-CM | POA: Diagnosis not present

## 2016-06-30 DIAGNOSIS — E669 Obesity, unspecified: Secondary | ICD-10-CM | POA: Diagnosis not present

## 2016-06-30 DIAGNOSIS — R69 Illness, unspecified: Secondary | ICD-10-CM | POA: Diagnosis not present

## 2016-06-30 DIAGNOSIS — I69351 Hemiplegia and hemiparesis following cerebral infarction affecting right dominant side: Secondary | ICD-10-CM | POA: Diagnosis not present

## 2016-06-30 DIAGNOSIS — E785 Hyperlipidemia, unspecified: Secondary | ICD-10-CM | POA: Diagnosis not present

## 2016-06-30 DIAGNOSIS — G40909 Epilepsy, unspecified, not intractable, without status epilepticus: Secondary | ICD-10-CM | POA: Diagnosis not present

## 2016-06-30 NOTE — Progress Notes (Signed)
Ativan was pulled on 4/3 for patient.  Dose not needed at that time.  Discovered in jacket pocket today and  wasted in sharps container.  Witnessed by Caprice Beaver, RN Paxtyn Boyar C 1:50 PM

## 2016-07-07 ENCOUNTER — Other Ambulatory Visit: Payer: Self-pay

## 2016-07-07 NOTE — Patient Outreach (Signed)
[place]) Care Management  07/07/2016  Gabriel Romero [birth year] [id number]  EMMI stroke Referral date 07/07/16 Referral EMMI stroke RED alert Referral reason: Feeling worse overall: yes Day #   Telephone call to patient regarding EMMI stroke red alert. HIPAA verified with patient. Discussed EMMI stroke program with patient.  Patient denies feeling worse overall.  Patient states he has been feeling very good. Patient denies any headaches. Patient states he has his medications and is taking them as prescribed. RNCM gave patient contact phone number for community health and wellness and Dr. Clydene Fake office. Advised patient to call and schedule appointments as instructed at discharge. Patient verbalized understanding.  Patient states he is receiving physical and speech [facility] with [facility]. Patient states he has social support from his son. Patient states his son will take him to his doctor appointments. Patient states he is independent in doing everything else for himself. Patient states he is going to follow up with social services for disability.   RNCM reviewed signs/ symptoms of stroke with patient. Advised to call 911 for stroke like symptoms.  RNCM advised patient to take his medications as prescribed. RNCM advised patient to call [facility] wellness to scheduled post hospital follow up visit. Also discussed importance of establishing primary MD.  PLAN; RNCM will refer patient to care management assistant to close due to having no further needs.  Quinn Plowman RN,BSN,CCM [facility] Telephonic  [phone]

## 2016-07-09 DIAGNOSIS — S81802D Unspecified open wound, left lower leg, subsequent encounter: Secondary | ICD-10-CM | POA: Diagnosis not present

## 2016-07-09 DIAGNOSIS — I69351 Hemiplegia and hemiparesis following cerebral infarction affecting right dominant side: Secondary | ICD-10-CM | POA: Diagnosis not present

## 2016-07-09 DIAGNOSIS — E785 Hyperlipidemia, unspecified: Secondary | ICD-10-CM | POA: Diagnosis not present

## 2016-07-09 DIAGNOSIS — E669 Obesity, unspecified: Secondary | ICD-10-CM | POA: Diagnosis not present

## 2016-07-09 DIAGNOSIS — I10 Essential (primary) hypertension: Secondary | ICD-10-CM | POA: Diagnosis not present

## 2016-07-09 DIAGNOSIS — R69 Illness, unspecified: Secondary | ICD-10-CM | POA: Diagnosis not present

## 2016-07-09 DIAGNOSIS — G40909 Epilepsy, unspecified, not intractable, without status epilepticus: Secondary | ICD-10-CM | POA: Diagnosis not present

## 2016-07-10 DIAGNOSIS — I69351 Hemiplegia and hemiparesis following cerebral infarction affecting right dominant side: Secondary | ICD-10-CM | POA: Diagnosis not present

## 2016-07-10 DIAGNOSIS — G40909 Epilepsy, unspecified, not intractable, without status epilepticus: Secondary | ICD-10-CM | POA: Diagnosis not present

## 2016-07-10 DIAGNOSIS — I10 Essential (primary) hypertension: Secondary | ICD-10-CM | POA: Diagnosis not present

## 2016-07-10 DIAGNOSIS — E669 Obesity, unspecified: Secondary | ICD-10-CM | POA: Diagnosis not present

## 2016-07-10 DIAGNOSIS — R69 Illness, unspecified: Secondary | ICD-10-CM | POA: Diagnosis not present

## 2016-07-10 DIAGNOSIS — S81802D Unspecified open wound, left lower leg, subsequent encounter: Secondary | ICD-10-CM | POA: Diagnosis not present

## 2016-07-10 DIAGNOSIS — E785 Hyperlipidemia, unspecified: Secondary | ICD-10-CM | POA: Diagnosis not present

## 2016-07-11 DIAGNOSIS — E669 Obesity, unspecified: Secondary | ICD-10-CM | POA: Diagnosis not present

## 2016-07-11 DIAGNOSIS — R69 Illness, unspecified: Secondary | ICD-10-CM | POA: Diagnosis not present

## 2016-07-11 DIAGNOSIS — I69351 Hemiplegia and hemiparesis following cerebral infarction affecting right dominant side: Secondary | ICD-10-CM | POA: Diagnosis not present

## 2016-07-11 DIAGNOSIS — G40909 Epilepsy, unspecified, not intractable, without status epilepticus: Secondary | ICD-10-CM | POA: Diagnosis not present

## 2016-07-11 DIAGNOSIS — S81802D Unspecified open wound, left lower leg, subsequent encounter: Secondary | ICD-10-CM | POA: Diagnosis not present

## 2016-07-11 DIAGNOSIS — E785 Hyperlipidemia, unspecified: Secondary | ICD-10-CM | POA: Diagnosis not present

## 2016-07-11 DIAGNOSIS — I10 Essential (primary) hypertension: Secondary | ICD-10-CM | POA: Diagnosis not present

## 2016-07-14 DIAGNOSIS — G40909 Epilepsy, unspecified, not intractable, without status epilepticus: Secondary | ICD-10-CM | POA: Diagnosis not present

## 2016-07-14 DIAGNOSIS — S81802D Unspecified open wound, left lower leg, subsequent encounter: Secondary | ICD-10-CM | POA: Diagnosis not present

## 2016-07-14 DIAGNOSIS — R69 Illness, unspecified: Secondary | ICD-10-CM | POA: Diagnosis not present

## 2016-07-14 DIAGNOSIS — I69351 Hemiplegia and hemiparesis following cerebral infarction affecting right dominant side: Secondary | ICD-10-CM | POA: Diagnosis not present

## 2016-07-14 DIAGNOSIS — E785 Hyperlipidemia, unspecified: Secondary | ICD-10-CM | POA: Diagnosis not present

## 2016-07-14 DIAGNOSIS — I10 Essential (primary) hypertension: Secondary | ICD-10-CM | POA: Diagnosis not present

## 2016-07-14 DIAGNOSIS — E669 Obesity, unspecified: Secondary | ICD-10-CM | POA: Diagnosis not present

## 2016-07-15 DIAGNOSIS — S81802D Unspecified open wound, left lower leg, subsequent encounter: Secondary | ICD-10-CM | POA: Diagnosis not present

## 2016-07-15 DIAGNOSIS — R69 Illness, unspecified: Secondary | ICD-10-CM | POA: Diagnosis not present

## 2016-07-15 DIAGNOSIS — I10 Essential (primary) hypertension: Secondary | ICD-10-CM | POA: Diagnosis not present

## 2016-07-15 DIAGNOSIS — E785 Hyperlipidemia, unspecified: Secondary | ICD-10-CM | POA: Diagnosis not present

## 2016-07-15 DIAGNOSIS — E669 Obesity, unspecified: Secondary | ICD-10-CM | POA: Diagnosis not present

## 2016-07-15 DIAGNOSIS — G40909 Epilepsy, unspecified, not intractable, without status epilepticus: Secondary | ICD-10-CM | POA: Diagnosis not present

## 2016-07-15 DIAGNOSIS — I69351 Hemiplegia and hemiparesis following cerebral infarction affecting right dominant side: Secondary | ICD-10-CM | POA: Diagnosis not present

## 2016-07-17 DIAGNOSIS — E785 Hyperlipidemia, unspecified: Secondary | ICD-10-CM | POA: Diagnosis not present

## 2016-07-17 DIAGNOSIS — G40909 Epilepsy, unspecified, not intractable, without status epilepticus: Secondary | ICD-10-CM | POA: Diagnosis not present

## 2016-07-17 DIAGNOSIS — I10 Essential (primary) hypertension: Secondary | ICD-10-CM | POA: Diagnosis not present

## 2016-07-17 DIAGNOSIS — K219 Gastro-esophageal reflux disease without esophagitis: Secondary | ICD-10-CM | POA: Diagnosis not present

## 2016-07-31 ENCOUNTER — Encounter: Payer: Self-pay | Admitting: Neurology

## 2016-07-31 ENCOUNTER — Ambulatory Visit (INDEPENDENT_AMBULATORY_CARE_PROVIDER_SITE_OTHER): Payer: Medicare HMO | Admitting: Neurology

## 2016-07-31 VITALS — HR 62 | Ht 70.0 in | Wt 192.8 lb

## 2016-07-31 DIAGNOSIS — I611 Nontraumatic intracerebral hemorrhage in hemisphere, cortical: Secondary | ICD-10-CM | POA: Diagnosis not present

## 2016-07-31 NOTE — Patient Instructions (Signed)
I had a long discussion with the patient and his daughter regarding his recent intracerebral hemorrhage, personally reviewed imaging films and answered questions. I recommend he maintain aggressive blood pressure control with goal below 130/90 and I advised him to follow-up with his primary care physician for the same. Continue Keppra for seizure prevention. I have complimented him on quitting alcohol and counseled him to quit smoking as well. Check MRI scan of the brain with and without contrast to look for any underlying structural or vascular lesions. I have advised him not to drive as but not given a lot. He will return for follow-up in 3 months with my nurse practitioner or call earlier if necessary.

## 2016-08-01 DIAGNOSIS — E785 Hyperlipidemia, unspecified: Secondary | ICD-10-CM | POA: Diagnosis not present

## 2016-08-01 DIAGNOSIS — I10 Essential (primary) hypertension: Secondary | ICD-10-CM | POA: Diagnosis not present

## 2016-08-01 DIAGNOSIS — G40909 Epilepsy, unspecified, not intractable, without status epilepticus: Secondary | ICD-10-CM | POA: Diagnosis not present

## 2016-08-01 DIAGNOSIS — K219 Gastro-esophageal reflux disease without esophagitis: Secondary | ICD-10-CM | POA: Diagnosis not present

## 2016-08-01 NOTE — Progress Notes (Signed)
[facility] [address] [place]. [place] [address] [phone]       OFFICE FOLLOW-UP NOTE  [name] Date of Birth:  Jan 10, 1951 Medical Record Number:  [id number]   HPI: Mr [name] is a 60 year pleasant male who is seen today for the first office follow-up visit following hospital admission for an to cerebral hemorrhage and seizure in April 2018.Gabriel Romero is a 66 y.o. male with a history of severe hypertension who presents with new onset seizures in the setting of a typical hematoma. He did fall earlier in the day, but does not clear if he hit his head, there is no report of loss of consciousness. He then had sudden onset seizure activity was brought in via EMS. Head CT reveals a temporal lobe hemorrhage. LKW: Earlier today, I'm not certain what time.tpa given?: no, ICH. ICH Score: 1. He was admitted to the intensive care unit where blood pressure was tightly controlled. CT angiogram of the brain was negative for large vessel stenosis or aneurysms. There was Noted siphon atheromatous plaque but 50-75% left greater than right narrowing. The right temporal intracerebral hemorrhage measure 2.6 x 1.7 x 1.8 cm. It remained stable and follow-up imaging studies. CT scan of cervical spine showed no significant compression on degenerative changes. Patient was started on Keppra for seizures and did well and had no further recurrent seizures. EEG was done which showed mild generalized slowing and no ongoing seizure activity. Patient became agitated and required Haldol and Precedex drip for alcohol withdrawal. He was seen by critical care team in medical hospitalist team to help with alcohol withdrawal management. He was advised not to drive. He returns today for follow-up in a complaint by his daughter. Patient has quit drinking completely and has not had any alcohol since discharge. Is also trying to quit smoking and has not quit completely yet. Is currently living at home with  his son. His blood pressure is doing better and he is being followed by his primary care physician who is adjusting his medications. His tolerate it Well without incident side effects. He has not been driving.  ROS:   14 system review of systems is positive for anxiety, nervousness, memory difficulties and all other systems negative PMH:  Past Medical History:  Diagnosis Date  . ETOH abuse   . Hypertension   . Seizures ([place])   . Stroke [facility])     Social History:  Social History   Social History  . Marital status: Single    Spouse name: N/A  . Number of children: N/A  . Years of education: N/A   Occupational History  . Bread delivery     [employer]   Social History Main Topics  . Smoking status: Current Some Day Smoker    Packs/day: 0.50    Types: Cigarettes  . Smokeless tobacco: Never Used  . Alcohol use 7.2 oz/week    12 Cans of beer per week     Comment: last quit march 2018  . Drug use: Yes    Frequency: 7.0 times per week    Types: Marijuana  . Sexual activity: Not on file   Other Topics Concern  . Not on file   Social History Narrative  . No narrative on file    Medications:   Current Outpatient Prescriptions on File Prior to Visit  Medication Sig Dispense Refill  . atorvastatin (LIPITOR) 20 MG tablet Take 1 tablet (20 mg total) by mouth daily at 6  PM. 30 tablet 1  . folic acid (FOLVITE) 1 MG tablet Take 1 tablet (1 mg total) by mouth daily.    [name] levETIRAcetam (KEPPRA) 500 MG tablet Take 1 tablet (500 mg total) by mouth 2 (two) times daily. 60 tablet 1  . multivitamin (PROSIGHT) TABS tablet Take 1 tablet by mouth daily. 30 each 0  . nicotine (NICODERM CQ - DOSED IN MG/24 HR) 7 mg/24hr patch Place 1 patch (7 mg total) onto the skin daily. 28 patch 0  . pantoprazole (PROTONIX) 40 MG tablet Take 1 tablet (40 mg total) by mouth daily. 30 tablet 1  . thiamine 100 MG tablet Take 1 tablet (100 mg total) by mouth daily.     No current facility-administered  medications on file prior to visit.     Allergies:   Allergies  Allergen Reactions  . Sulfa Antibiotics Rash    Physical Exam General: Frail middle-aged male, seated, in no evident distress Head: head normocephalic and atraumatic.  Neck: supple with no carotid or supraclavicular bruits Cardiovascular: regular rate and rhythm, no murmurs Musculoskeletal: no deformity Skin:  no rash/petichiae Vascular:  Normal pulses all extremities Vitals:   07/31/16 1125  Pulse: 62   Neurologic Exam Mental Status: Awake and fully alert. Oriented to place and time. Recent and remote memory poor. Attention span, concentration and fund of knowledge diminished. Recall 0/3. Able to name only 6 animals with 4 legs.. Mood and affect appropriate.  Cranial Nerves: Fundoscopic exam reveals sharp disc margins. Pupils equal, briskly reactive to light. Extraocular movements full without nystagmus. Visual fields full to confrontation. Hearing intact. Facial sensation intact. Face, tongue, palate moves normally and symmetrically.  Motor: Normal bulk and tone. Normal strength in all tested extremity muscles. Mild fine action tremor of outstretched upper extremities left greater than right. Sensory.: intact to touch ,pinprick .position and vibratory sensation.  Coordination: Rapid alternating movements normal in all extremities. Finger-to-nose and heel-to-shin performed accurately bilaterally. Gait and Station: Arises from chair without difficulty. Stance is normal. Gait demonstrates normal stride length and balance . Able to heel, toe and tandem walk with mild difficulty.  Reflexes: 1+ and symmetric. Toes downgoing.   NIHSS  1 Modified Rankin  2   ASSESSMENT: 75 year With right temporal lobar hematoma in March 2018 of indeterminate etiology. Hypertensive versus alcohol related. 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 I recommend he maintain aggressive blood pressure control with goal below 130/90 and I advised him to follow-up with his primary care physician for the same. Continue Keppra for seizure prevention. I have complimented him on quitting alcohol and counseled him to quit smoking as well. Check MRI scan of the brain with and without contrast to look for any underlying structural or vascular lesions. I have advised him not to drive as per [place] law. He will return for follow-up in 3 months with my nurse practitioner or call earlier if necessary. Greater than 50% of time during this 25 minute visit was spent on counseling,explanation of diagnosis of seizure and intracranial hemorrhage, planning of further management, discussion with patient and family and coordination of care Antony Contras, MD  [facility] [address] [place], [place] [address]  Phone [phone] Fax [phone] Note: This document was prepared with digital dictation and possible smart phrase technology. Any transcriptional errors that result from this  process are unintentional

## 2016-08-16 ENCOUNTER — Ambulatory Visit
Admission: RE | Admit: 2016-08-16 | Discharge: 2016-08-16 | Disposition: A | Discharge status: Home / Self Care | Payer: Medicare HMO | Source: Ambulatory Visit | Attending: Neurology | Admitting: Neurology

## 2016-08-16 DIAGNOSIS — I611 Nontraumatic intracerebral hemorrhage in hemisphere, cortical: Secondary | ICD-10-CM

## 2016-08-16 MED ORDER — GADOBENATE DIMEGLUMINE 529 MG/ML IV SOLN
18.0000 mL | Freq: Once | INTRAVENOUS | Status: AC | PRN
  Administered 2016-08-16: 18 mL via INTRAVENOUS

## 2016-08-22 ENCOUNTER — Telehealth: Payer: Self-pay | Admitting: Neurology

## 2016-08-22 DIAGNOSIS — G40909 Epilepsy, unspecified, not intractable, without status epilepticus: Secondary | ICD-10-CM | POA: Diagnosis not present

## 2016-08-22 DIAGNOSIS — I10 Essential (primary) hypertension: Secondary | ICD-10-CM | POA: Diagnosis not present

## 2016-08-22 DIAGNOSIS — K219 Gastro-esophageal reflux disease without esophagitis: Secondary | ICD-10-CM | POA: Diagnosis not present

## 2016-08-22 DIAGNOSIS — E785 Hyperlipidemia, unspecified: Secondary | ICD-10-CM | POA: Diagnosis not present

## 2016-08-22 NOTE — Telephone Encounter (Signed)
Pt daughter would like to be called as soon as the MRI results are available

## 2016-08-26 ENCOUNTER — Other Ambulatory Visit: Payer: Self-pay | Admitting: Neurology

## 2016-08-26 MED ORDER — ASPIRIN EC 81 MG PO TBEC: 81.0000 mg | DELAYED_RELEASE_TABLET | Freq: Every day | ORAL | 1 refills | Status: AC

## 2016-08-26 NOTE — Telephone Encounter (Signed)
[name] returning call regarding MRI.

## 2016-08-26 NOTE — Telephone Encounter (Signed)
I called inspect spoke to the patient's daughter and communicated results of MRI scan of the brain showing small lacunar acute infarcts as well as multiple remote age microhemorrhages given recent lobar hemorrhage the most likely etiology is microvascular disease related to uncontrolled hypertension. I recommend strict control of hypertension with blood pressure goal below 140/90 and start aspirin 81 mg daily. Patient is having some cognitive issues as well. I recommend close family supervision. He was advised to follow-up with primary physician for strict blood pressure control and keep his scheduled appointment with me in August. If patient had further cognitive worsening she was advised to call for an earlier appointment with me

## 2016-08-26 NOTE — Telephone Encounter (Signed)
I called and left a message on the patient's daughter's answering machine to call me back to discuss MRI results.

## 2016-08-26 NOTE — Telephone Encounter (Signed)
Pt's daughter called back. She said she did not rec' a call back from Dr Leonie Man. I advised her he calls when he is available in between seeing patients and he would call her. She said she is tired of waiting, she is anxious about her father's test results and demanded a call back today from Dr Leonie Man today.

## 2016-09-17 ENCOUNTER — Telehealth: Payer: Self-pay | Admitting: Neurology
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
## 2016-09-18 NOTE — Telephone Encounter (Signed)
Message sent to Dr.Sethi for review. 

## 2016-09-22 NOTE — Telephone Encounter (Signed)
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

## 2016-09-22 NOTE — Telephone Encounter (Addendum)
Rn call patients daughter Gabriel Romero, and confusion and memory. Brook stated its gotton little better. Rn ask if patient was seen by his PCP to rule out UTI. Rn stated sometimes patients can have memory and confusion with a UTI.Patients daughter talk to Dr. Leonie Man on 08/26/2016. See phone note from 08/22/2016, about her fathers cognitive issues. She call back 09/17/2016 about the confusion getting worse. Rn stated Dr. Leonie Man request pt get a earlier appt. Rn offer patients daughter an appt on 09/25/2016 with the work in slots. Pts daughter decline the appt. She stated her father will be seeing the PCP on tomorrow,and she will ask about evaluation for UTI. Daughter will keep appt in August 2018 with NP.Rn advised daughter to call back if she has any other concerns.

## 2016-09-23 DIAGNOSIS — I10 Essential (primary) hypertension: Secondary | ICD-10-CM | POA: Diagnosis not present

## 2016-09-23 DIAGNOSIS — G40909 Epilepsy, unspecified, not intractable, without status epilepticus: Secondary | ICD-10-CM | POA: Diagnosis not present

## 2016-09-23 DIAGNOSIS — K219 Gastro-esophageal reflux disease without esophagitis: Secondary | ICD-10-CM | POA: Diagnosis not present

## 2016-09-23 DIAGNOSIS — E785 Hyperlipidemia, unspecified: Secondary | ICD-10-CM | POA: Diagnosis not present

## 2016-10-23 NOTE — Progress Notes (Signed)
[facility]  PATIENT: Gabriel Romero DOB: 1950/09/25   REASON FOR VISIT: Follow-up for cerebral hemorrhage and seizure April 2018 HISTORY FROM:patietn and daughter [name]    HISTORY OF PRESENT ILLNESS:UPDATE 08/03/2018CM [name], 66 year old male returns for follow-up with his daughter. He has a history of cerebral hemorrhage and seizure which occurred in April 2018. Repeat MRI 08/16/16 This MRI of the brain with and without contrast shows the following: 1.    Many chronic microhemorrhages in the hemispheres, deep gray matter, brainstem and cerebellum consistent with cerebral amyloid angiopathy. The sequela of the right temporal lobe hemorrhage that occurred earlier this year is noted with expected evolutionary changes. 2.    Four small acute infarctions noted in the mesial left frontal lobe, posterior right frontal lobe, left thalamus and right anterior pons.  3.    Severe chronic microvascular ischemic change, possibly with a superimposed right parietal chronic stroke. 4.    Cortical atrophy most pronounced in the mesial temporal lobes.  Corpus callosal atrophy is also noted. 5.    Minimal chronic ethmoid sinusitis. Aspirin was restarted and he is tolerating without bruising or bleeding. Blood pressure in the office today 140/80. Memory is much better according to the daughter. Patient is no longer drinking alcohol. He has cut down from 2 packs a day smoking to half a pack. He remains on Keppra 500 mg twice daily for seizure. He has not had further seizure activity. He knows he cannot drive for 6 months. He is currently living with his son. Appetite is good and he is sleeping well at night. He returns for reevaluation  HISTORY 07/31/16 PSMr Gabriel Romero is a 19 year pleasant male who is seen today for the first office follow-up visit following hospital admission for an to cerebral hemorrhage and seizure in April 2018.Gabriel L Dupreeis a 66 y.o.malewith a history of severe  hypertension who presents with new onset seizures in the setting of a typical hematoma. He did fall earlier in the day, but does not clear if he hit his head, there is no report of loss of consciousness. He then had sudden onset seizure activity was brought in via EMS. Head CT reveals a temporal lobe hemorrhage. LKW: Earlier today, I'm not certain what time.tpa given?: no, ICH. ICH Score: 1. He was admitted to the intensive care unit where blood pressure was tightly controlled. CT angiogram of the brain was negative for large vessel stenosis or aneurysms. There was Noted siphon atheromatous plaque but 50-75% left greater than right narrowing. The right temporal intracerebral hemorrhage measure 2.6 x 1.7 x 1.8 cm. It remained stable and follow-up imaging studies. CT scan of cervical spine showed no significant compression on degenerative changes. Patient was started on Keppra for seizures and did well and had no further recurrent seizures. EEG was done which showed mild generalized slowing and no ongoing seizure activity. Patient became agitated and required Haldol and Precedex drip for alcohol withdrawal. He was seen by critical care team in medical hospitalist team to help with alcohol withdrawal management. He was advised not to drive. He returns today for follow-up in a complaint by his daughter. Patient has quit drinking completely and has not had any alcohol since discharge. Is also trying to quit smoking and has not quit completely yet. Is currently living at home with his son. His blood pressure is doing better and he is being followed by his primary care physician who is adjusting his medications. He has not been driving.  REVIEW OF SYSTEMS: Full 14 system review of systems performed and notable only for those listed, all others are neg:  Constitutional: neg  Cardiovascular: neg Ear/Nose/Throat: neg  Skin: neg Eyes: neg Respiratory: neg Gastroitestinal: neg  Hematology/Lymphatic: neg  Endocrine:  neg Musculoskeletal:neg Allergy/Immunology: neg Neurological: neg Psychiatric: neg Sleep : neg   ALLERGIES: Allergies  Allergen Reactions  . Sulfa Antibiotics Rash    HOME MEDICATIONS: Outpatient Medications Prior to Visit  Medication Sig Dispense Refill  . aspirin EC 81 MG tablet Take 1 tablet (81 mg total) by mouth daily. 1 tablet 1  . atorvastatin (LIPITOR) 20 MG tablet Take 1 tablet (20 mg total) by mouth daily at 6 PM. 30 tablet 1  . folic acid (FOLVITE) 1 MG tablet Take 1 tablet (1 mg total) by mouth daily.    [name] levETIRAcetam (KEPPRA) 500 MG tablet Take 1 tablet (500 mg total) by mouth 2 (two) times daily. 60 tablet 1  . multivitamin (PROSIGHT) TABS tablet Take 1 tablet by mouth daily. 30 each 0  . nicotine (NICODERM CQ - DOSED IN MG/24 HR) 7 mg/24hr patch Place 1 patch (7 mg total) onto the skin daily. 28 patch 0  . pantoprazole (PROTONIX) 40 MG tablet Take 1 tablet (40 mg total) by mouth daily. 30 tablet 1  . thiamine 100 MG tablet Take 1 tablet (100 mg total) by mouth daily.    [name] lisinopril (PRINIVIL,ZESTRIL) 10 MG tablet Take 10 mg by mouth daily.     No facility-administered medications prior to visit.     PAST MEDICAL HISTORY: Past Medical History:  Diagnosis Date  . ETOH abuse   . Hypertension   . Seizures ([place])   . Stroke [facility])     PAST SURGICAL HISTORY: History reviewed. No pertinent surgical history.  FAMILY HISTORY: Family History  Problem Relation Age of Onset  . Dementia Mother   . Hypertension Father     SOCIAL HISTORY: Social History   Social History  . Marital status: Single    Spouse name: N/A  . Number of children: 2  . Years of education: N/A   Occupational History  . Bread delivery     [employer]   Social History Main Topics  . Smoking status: Current Some Day Smoker    Packs/day: 0.50    Types: Cigarettes  . Smokeless tobacco: Never Used  . Alcohol use 7.2 oz/week    12 Cans of beer per week     Comment: last quit  march 2018  . Drug use: Yes    Frequency: 7.0 times per week    Types: Marijuana  . Sexual activity: Not on file   Other Topics Concern  . Not on file   Social History Narrative   Lives with son     PHYSICAL EXAM  Vitals:   10/24/16 0816  BP: (!) 140/80   Pulse: 68  Weight: 177 lb 3.2 oz (80.4 kg)   Body mass index is 25.43 kg/m.  Generalized: Well developed, in no acute distress  Head: normocephalic and atraumatic,. Oropharynx benign  Neck: Supple, no carotid bruits  Cardiac: Regular rate rhythm, no murmur  Musculoskeletal: No deformity   Neurological examination   Mentation: Alert oriented to time, place, history taking. Attention span and concentration appropriate. Recent and remote memory intact.  Follows all commands speech and language fluent.   Cranial nerve II-XII: Fundoscopic exam reveals sharp disc margins.Pupils were equal round reactive to light extraocular movements were full, visual field were  full on confrontational test. Facial sensation and strength were normal. hearing was intact to finger rubbing bilaterally. Uvula tongue midline. head turning and shoulder shrug were normal and symmetric.Tongue protrusion into cheek strength was normal. Motor: normal bulk and tone, full strength in the BUE, BLE, fine finger movements normal, no pronator drift. No focal weakness Sensory: normal and symmetric to light touch, pinprick, and  Vibration, including upper and lower extremities Coordination: finger-nose-finger, heel-to-shin bilaterally, no dysmetria Reflexes: 1+ upper lower and symmetric plantar responses were flexor bilaterally. Gait and Station: Rising up from seated position without assistance, normal stance,  moderate stride, good arm swing, smooth turning, able to perform tiptoe, and heel walking without difficulty. Tandem gait is mildly unsteady  DIAGNOSTIC DATA (LABS, IMAGING, TESTING) - I reviewed patient records, labs, notes, testing and imaging myself  where available.  Lab Results  Component Value Date   WBC 8.3 06/26/2016   HGB 11.6 (L) 06/26/2016   HCT 36.1 (L) 06/26/2016   MCV 96.0 06/26/2016   PLT 184 06/26/2016      Component Value Date/Time   NA 143 06/28/2016 0513   K 3.6 06/28/2016 0513   CL 108 06/28/2016 0513   CO2 25 06/28/2016 0513   GLUCOSE 97 06/28/2016 0513   BUN 16 06/28/2016 0513   CREATININE 1.21 06/28/2016 0513   CALCIUM 8.7 (L) 06/28/2016 0513   PROT 7.4 06/18/2016 1530   ALBUMIN 2.8 (L) 06/28/2016 0513   AST 27 06/18/2016 1530   ALT 18 06/18/2016 1530   ALKPHOS 69 06/18/2016 1530   BILITOT 1.3 (H) 06/18/2016 1530   GFRNONAA >60 06/28/2016 0513   GFRAA >60 06/28/2016 0513   Lab Results  Component Value Date   CHOL 205 (H) 06/19/2016   HDL 57 06/19/2016   LDLCALC 123 (H) 06/19/2016   TRIG 123 06/19/2016   CHOLHDL 3.6 06/19/2016   Lab Results  Component Value Date   HGBA1C 5.6 06/19/2016   Lab Results  Component Value Date   VITAMINB12 213 06/19/2016   Lab Results  Component Value Date   TSH 3.656 06/19/2016      ASSESSMENT AND PLAN 65 year With right temporal lobar hematoma in March 2018 of indeterminate etiology. Hypertensive versus alcohol related. 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  Stressed the importance of management of risk factors to prevent further stroke Continue aspirin for secondary stroke prevention Maintain strict control of hypertension with blood pressure goal below 130/90, today's reading 140/80 continue antihypertensive medications Cholesterol with LDL cholesterol less than 70, followed by primary care,  most recent 123 continue statin drugs lipitor Continue Keppra 500 mg twice daily for seizure will refill Try to stop smoking altogether Exercise by walking,  eat healthy diet with whole grains,  fresh fruits and vegetables Follow up in 6 months I spent 25 minutes in total face to face time with the patient more than 50% of which was spent  counseling and coordination of care, reviewing test results reviewing medications and discussing and reviewing the diagnosis of stroke and management of risk factors. Also discussed seizure triggers. Patient has where he cannot drive for 6 months Dennie Bible, [facility], [facility], APRN  [facility] [address] [place], [place] [address] [phone]

## 2016-10-24 ENCOUNTER — Encounter: Payer: Self-pay | Admitting: Nurse Practitioner

## 2016-10-24 ENCOUNTER — Ambulatory Visit (INDEPENDENT_AMBULATORY_CARE_PROVIDER_SITE_OTHER): Payer: Medicare HMO | Admitting: Nurse Practitioner

## 2016-10-24 VITALS — BP 140/80 | HR 68 | Wt 177.2 lb

## 2016-10-24 DIAGNOSIS — I10 Essential (primary) hypertension: Secondary | ICD-10-CM

## 2016-10-24 DIAGNOSIS — R569 Unspecified convulsions: Secondary | ICD-10-CM

## 2016-10-24 DIAGNOSIS — R69 Illness, unspecified: Secondary | ICD-10-CM | POA: Diagnosis not present

## 2016-10-24 DIAGNOSIS — F17200 Nicotine dependence, unspecified, uncomplicated: Secondary | ICD-10-CM | POA: Diagnosis not present

## 2016-10-24 DIAGNOSIS — I611 Nontraumatic intracerebral hemorrhage in hemisphere, cortical: Secondary | ICD-10-CM | POA: Diagnosis not present

## 2016-10-24 MED ORDER — LEVETIRACETAM 500 MG PO TABS: 500.0000 mg | ORAL_TABLET | Freq: Two times a day (BID) | ORAL | 11 refills | Status: AC

## 2016-10-24 NOTE — Patient Instructions (Addendum)
Stressed the importance of management of risk factors to prevent further stroke Continue aspirin for secondary stroke prevention Maintain strict control of hypertension with blood pressure goal below 130/90, today's reading 140/80 continue antihypertensive medications Cholesterol with LDL cholesterol less than 70, followed by primary care,  most recent 123 continue statin drugs lipitor Continue Keppra 500 mg twice daily for seizure will refill Try to stop smoking altogether Exercise by walking,  eat healthy diet with whole grains,  fresh fruits and vegetables Follow up in 6 months

## 2016-10-26 NOTE — Progress Notes (Signed)
I agree with the above plan 

## 2016-12-26 DIAGNOSIS — E559 Vitamin D deficiency, unspecified: Secondary | ICD-10-CM | POA: Diagnosis not present

## 2016-12-26 DIAGNOSIS — E785 Hyperlipidemia, unspecified: Secondary | ICD-10-CM | POA: Diagnosis not present

## 2016-12-26 DIAGNOSIS — I10 Essential (primary) hypertension: Secondary | ICD-10-CM | POA: Diagnosis not present

## 2016-12-29 DIAGNOSIS — Z87898 Personal history of other specified conditions: Secondary | ICD-10-CM | POA: Diagnosis not present

## 2016-12-29 DIAGNOSIS — R69 Illness, unspecified: Secondary | ICD-10-CM | POA: Diagnosis not present

## 2016-12-29 DIAGNOSIS — K219 Gastro-esophageal reflux disease without esophagitis: Secondary | ICD-10-CM | POA: Diagnosis not present

## 2016-12-29 DIAGNOSIS — I10 Essential (primary) hypertension: Secondary | ICD-10-CM | POA: Diagnosis not present

## 2016-12-29 DIAGNOSIS — E785 Hyperlipidemia, unspecified: Secondary | ICD-10-CM | POA: Diagnosis not present

## 2016-12-29 DIAGNOSIS — G40909 Epilepsy, unspecified, not intractable, without status epilepticus: Secondary | ICD-10-CM | POA: Diagnosis not present

## 2016-12-29 DIAGNOSIS — Z8673 Personal history of transient ischemic attack (TIA), and cerebral infarction without residual deficits: Secondary | ICD-10-CM | POA: Diagnosis not present

## 2017-01-14 DIAGNOSIS — R69 Illness, unspecified: Secondary | ICD-10-CM | POA: Diagnosis not present

## 2017-01-14 DIAGNOSIS — I10 Essential (primary) hypertension: Secondary | ICD-10-CM | POA: Diagnosis not present

## 2017-01-14 DIAGNOSIS — I69319 Unspecified symptoms and signs involving cognitive functions following cerebral infarction: Secondary | ICD-10-CM | POA: Diagnosis not present

## 2017-01-14 DIAGNOSIS — K08409 Partial loss of teeth, unspecified cause, unspecified class: Secondary | ICD-10-CM | POA: Diagnosis not present

## 2017-01-14 DIAGNOSIS — E7800 Pure hypercholesterolemia, unspecified: Secondary | ICD-10-CM | POA: Diagnosis not present

## 2017-01-14 DIAGNOSIS — G40909 Epilepsy, unspecified, not intractable, without status epilepticus: Secondary | ICD-10-CM | POA: Diagnosis not present

## 2017-01-14 DIAGNOSIS — K219 Gastro-esophageal reflux disease without esophagitis: Secondary | ICD-10-CM | POA: Diagnosis not present

## 2017-01-14 DIAGNOSIS — Z6827 Body mass index (BMI) 27.0-27.9, adult: Secondary | ICD-10-CM | POA: Diagnosis not present

## 2017-01-14 DIAGNOSIS — Z0000 Encounter for general adult medical examination without abnormal findings: Secondary | ICD-10-CM | POA: Diagnosis not present

## 2017-04-27 NOTE — Progress Notes (Signed)
[facility]  PATIENT: Gabriel Romero DOB: 09/14/1950   REASON FOR VISIT: Follow-up for cerebral hemorrhage and seizure April 2018 HISTORY FROM:patietn and daughter [name]    HISTORY OF PRESENT ILLNESS:UPDATE 2/5/2019CM [name], 67 year old male returns for follow-up with a history of cerebral hemorrhage and seizure April 2018.  He is supposed to be on aspirin 0.81 daily but he ran out of the medication and has not been to the drugstore.  He has not had further stroke symptoms.  He remains on atorvastatin without myalgias.  He remains on Keppra 500 twice daily without further seizure activity.  He continues to smoke a pack and a half a day and was encouraged to quit.  He exercises by walking every day.  He is currently living with his son.  Appetite is good and he is sleeping well.  He returns for reevaluation  UPDATE 08/03/2018CM [name], 68 year old male returns for follow-up with his daughter. He has a history of cerebral hemorrhage and seizure which occurred in April 2018. Repeat MRI 08/16/16 This MRI of the brain with and without contrast shows the following: 1.    Many chronic microhemorrhages in the hemispheres, deep gray matter, brainstem and cerebellum consistent with cerebral amyloid angiopathy. The sequela of the right temporal lobe hemorrhage that occurred earlier this year is noted with expected evolutionary changes. 2.    Four small acute infarctions noted in the mesial left frontal lobe, posterior right frontal lobe, left thalamus and right anterior pons.  3.    Severe chronic microvascular ischemic change, possibly with a superimposed right parietal chronic stroke. 4.    Cortical atrophy most pronounced in the mesial temporal lobes.  Corpus callosal atrophy is also noted. 5.    Minimal chronic ethmoid sinusitis. Aspirin was restarted and he is tolerating without bruising or bleeding. Blood pressure in the office today 140/80. Memory is much better  according to the daughter. Patient is no longer drinking alcohol. He has cut down from 2 packs a day smoking to half a pack. He remains on Keppra 500 mg twice daily for seizure. He has not had further seizure activity. He knows he cannot drive for 6 months. He is currently living with his son. Appetite is good and he is sleeping well at night. He returns for reevaluation  HISTORY 07/31/16 PSMr Waters is a 32 year [place] male who is seen today for the first office follow-up visit following hospital admission for an to cerebral hemorrhage and seizure in April 2018.Cevin L Dupreeis a 67 y.o.malewith a history of severe hypertension who presents with new onset seizures in the setting of a typical hematoma. He did fall earlier in the day, but does not clear if he hit his head, there is no report of loss of consciousness. He then had sudden onset seizure activity was brought in via EMS. Head CT reveals a temporal lobe hemorrhage. LKW: Earlier today, I'm not certain what time.tpa given?: no, ICH. ICH Score: 1. He was admitted to the intensive care unit where blood pressure was tightly controlled. CT angiogram of the brain was negative for large vessel stenosis or aneurysms. There was Noted siphon atheromatous plaque but 50-75% left greater than right narrowing. The right temporal intracerebral hemorrhage measure 2.6 x 1.7 x 1.8 cm. It remained stable and follow-up imaging studies. CT scan of cervical spine showed no significant compression on degenerative changes. Patient was started on Keppra for seizures and did well and had no further recurrent seizures. EEG was  done which showed mild generalized slowing and no ongoing seizure activity. Patient became agitated and required Haldol and Precedex drip for alcohol withdrawal. He was seen by critical care team in medical hospitalist team to help with alcohol withdrawal management. He was advised not to drive. He returns today for follow-up in a complaint by his  daughter. Patient has quit drinking completely and has not had any alcohol since discharge. Is also trying to quit smoking and has not quit completely yet. Is currently living at home with his son. His blood pressure is doing better and he is being followed by his primary care physician who is adjusting his medications. He has not been driving.   REVIEW OF SYSTEMS: Full 14 system review of systems performed and notable only for those listed, all others are neg:  Constitutional: neg  Cardiovascular: neg Ear/Nose/Throat: Ringing in ears Skin: neg Eyes: neg Respiratory: neg Gastroitestinal: neg  Hematology/Lymphatic: neg  Endocrine: neg Musculoskeletal:neg Allergy/Immunology: neg Neurological: History of intra-cerebral hemorrhage, seizure disorder Psychiatric: neg Sleep : neg   ALLERGIES: Allergies  Allergen Reactions  . Sulfa Antibiotics Rash    HOME MEDICATIONS: Outpatient Medications Prior to Visit  Medication Sig Dispense Refill  . amLODipine (NORVASC) 5 MG tablet 5 mg daily.    [name] aspirin EC 81 MG tablet Take 1 tablet (81 mg total) by mouth daily. 1 tablet 1  . atorvastatin (LIPITOR) 20 MG tablet Take 1 tablet (20 mg total) by mouth daily at 6 PM. 30 tablet 1  . diazepam (VALIUM) 5 MG tablet     . folic acid (FOLVITE) 1 MG tablet Take 1 tablet (1 mg total) by mouth daily.    [name] levETIRAcetam (KEPPRA) 500 MG tablet Take 1 tablet (500 mg total) by mouth 2 (two) times daily. 60 tablet 11  . lisinopril-hydrochlorothiazide (PRINZIDE,ZESTORETIC) 20-12.5 MG tablet     . multivitamin (PROSIGHT) TABS tablet Take 1 tablet by mouth daily. 30 each 0  . pantoprazole (PROTONIX) 40 MG tablet Take 1 tablet (40 mg total) by mouth daily. 30 tablet 1  . thiamine 100 MG tablet Take 1 tablet (100 mg total) by mouth daily.    . nicotine (NICODERM CQ - DOSED IN MG/24 HR) 7 mg/24hr patch Place 1 patch (7 mg total) onto the skin daily. (Patient not taking: Reported on 04/28/2017) 28 patch 0   No  facility-administered medications prior to visit.     PAST MEDICAL HISTORY: Past Medical History:  Diagnosis Date  . ETOH abuse   . Hypertension   . Seizures ([place])   . Stroke [facility])     PAST SURGICAL HISTORY: History reviewed. No pertinent surgical history.  FAMILY HISTORY: Family History  Problem Relation Age of Onset  . Dementia Mother   . Hypertension Father     SOCIAL HISTORY: Social History   Socioeconomic History  . Marital status: Single    Spouse name: Not on file  . Number of children: 2  . Years of education: Not on file  . Highest education level: Not on file  Social Needs  . Financial resource strain: Not on file  . Food insecurity - worry: Not on file  . Food insecurity - inability: Not on file  . Transportation needs - medical: Not on file  . Transportation needs - non-medical: Not on file  Occupational History  . Occupation: Bread delivery    Comment: [employer]  Tobacco Use  . Smoking status: Current Some Day Smoker    Packs/day: 1.50  Types: Cigarettes  . Smokeless tobacco: Never Used  Substance and Sexual Activity  . Alcohol use: Yes    Alcohol/week: 7.2 oz    Types: 12 Cans of beer per week    Comment: last quit march 2018  . Drug use: Yes    Frequency: 7.0 times per week    Types: Marijuana    Comment: every 3  months  . Sexual activity: Not on file  Other Topics Concern  . Not on file  Social History Narrative   Lives with son     PHYSICAL EXAM  Vitals:   04/28/17  BP: (!) 144/79   Pulse: 66  Weight: 177 lb 3.2 oz (80.4 kg)   Body mass index is 26.69 kg/m.  Generalized: Well developed, in no acute distress  Head: normocephalic and atraumatic,. Oropharynx benign  Neck: Supple, no carotid bruits  Cardiac: Regular rate rhythm, no murmur  Musculoskeletal: No deformity   Neurological examination   Mentation: Alert oriented to time, place, history taking. Attention span and concentration appropriate. Recent and  remote memory intact.  Follows all commands speech and language fluent.   Cranial nerve II-XII: Pupils were equal round reactive to light extraocular movements were full, visual field were full on confrontational test. Facial sensation and strength were normal. hearing was intact to finger rubbing bilaterally. Uvula tongue midline. head turning and shoulder shrug were normal and symmetric.Tongue protrusion into cheek strength was normal. Motor: normal bulk and tone, full strength in the BUE, BLE, fine finger movements normal, no pronator drift.  Sensory: normal and symmetric to light touch, pinprick, and  Vibration, in the  upper and lower extremities Coordination: finger-nose-finger, heel-to-shin bilaterally, no dysmetria Reflexes: 1+ upper lower and symmetric plantar responses were flexor bilaterally. Gait and Station: Rising up from seated position without assistance, normal stance,  moderate stride, good arm swing, smooth turning, able to perform tiptoe, and heel walking without difficulty. Tandem gait is mildly unsteady.  No assistive device DIAGNOSTIC DATA (LABS, IMAGING, TESTING) - I reviewed patient records, labs, notes, testing and imaging myself where available.  Lab Results  Component Value Date   WBC 8.3 06/26/2016   HGB 11.6 (L) 06/26/2016   HCT 36.1 (L) 06/26/2016   MCV 96.0 06/26/2016   PLT 184 06/26/2016      Component Value Date/Time   NA 143 06/28/2016 0513   K 3.6 06/28/2016 0513   CL 108 06/28/2016 0513   CO2 25 06/28/2016 0513   GLUCOSE 97 06/28/2016 0513   BUN 16 06/28/2016 0513   CREATININE 1.21 06/28/2016 0513   CALCIUM 8.7 (L) 06/28/2016 0513   PROT 7.4 06/18/2016 1530   ALBUMIN 2.8 (L) 06/28/2016 0513   AST 27 06/18/2016 1530   ALT 18 06/18/2016 1530   ALKPHOS 69 06/18/2016 1530   BILITOT 1.3 (H) 06/18/2016 1530   GFRNONAA >60 06/28/2016 0513   GFRAA >60 06/28/2016 0513   Lab Results  Component Value Date   CHOL 205 (H) 06/19/2016   HDL 57 06/19/2016    LDLCALC 123 (H) 06/19/2016   TRIG 123 06/19/2016   CHOLHDL 3.6 06/19/2016   Lab Results  Component Value Date   HGBA1C 5.6 06/19/2016   Lab Results  Component Value Date   VITAMINB12 213 06/19/2016   Lab Results  Component Value Date   TSH 3.656 06/19/2016      ASSESSMENT AND PLAN 66year With right temporal lobar hematoma in March 2018 of indeterminate etiology. Hypertensive versus alcohol related. Symptomatic seizures from  intracerebral hemorrhage. The patient is a current patient of Dr. Leonie Man  who is out of the office today . This note is sent to the work in doctor.     PLAN: Stressed the importance of management of risk factors to prevent further stroke Restart aspirin for secondary stroke prevention pt was advised to get from drug store it is OTC. Maintain strict control of hypertension with blood pressure goal below 130/90, today's reading 144/79 continue antihypertensive medications Cholesterol with LDL cholesterol less than 70, followed by primary care, continue Lipitor Continue Keppra 500 mg twice daily for seizure will refill Call for any seizure activity Try to stop smoking altogether Continue to exercise by walking, at least 30 min daily eat healthy diet with whole grains,  fresh fruits and vegetables Follow up in 8 months I spent 25 minutes in total face to face time with the patient more than 50% of which was spent counseling and coordination of care, reviewing test results reviewing medications and discussing and reviewing the diagnosis of stroke and management of risk factors. Also discussed seizure triggers.  Patient instructed to get back on his aspirin which is for secondary stroke prevention  Dennie Bible, [facility], [facility], [facility] [address], [place], [place] [address] [phone]

## 2017-04-28 ENCOUNTER — Ambulatory Visit: Payer: Medicare HMO | Admitting: Nurse Practitioner

## 2017-04-28 ENCOUNTER — Encounter: Payer: Self-pay | Admitting: Nurse Practitioner

## 2017-04-28 ENCOUNTER — Encounter (INDEPENDENT_AMBULATORY_CARE_PROVIDER_SITE_OTHER): Payer: Self-pay

## 2017-04-28 VITALS — BP 144/79 | HR 66 | Wt 186.0 lb

## 2017-04-28 DIAGNOSIS — F17200 Nicotine dependence, unspecified, uncomplicated: Secondary | ICD-10-CM | POA: Diagnosis not present

## 2017-04-28 DIAGNOSIS — I611 Nontraumatic intracerebral hemorrhage in hemisphere, cortical: Secondary | ICD-10-CM | POA: Diagnosis not present

## 2017-04-28 DIAGNOSIS — I10 Essential (primary) hypertension: Secondary | ICD-10-CM

## 2017-04-28 DIAGNOSIS — R69 Illness, unspecified: Secondary | ICD-10-CM | POA: Diagnosis not present

## 2017-04-28 DIAGNOSIS — R569 Unspecified convulsions: Secondary | ICD-10-CM | POA: Diagnosis not present

## 2017-04-28 NOTE — Progress Notes (Signed)
I have read the note, and I agree with the clinical assessment and plan.  Richard A. Sater, MD, PhD, FAAN Certified in Neurology, Clinical Neurophysiology, Sleep Medicine, Pain Medicine and Neuroimaging  [facility] [address], [address] [place], [place] [address] [phone]  

## 2017-04-28 NOTE — Patient Instructions (Signed)
Stressed the importance of management of risk factors to prevent further stroke Continue aspirin for secondary stroke prevention Maintain strict control of hypertension with blood pressure goal below 130/90, today's reading 144/79 continue antihypertensive medications Cholesterol with LDL cholesterol less than 70, followed by primary care, continue Lipitor Continue Keppra 500 mg twice daily for seizure will refill Call for any seizure activity Try to stop smoking altogether Continue to exercise by walking, at least 30 min daily eat healthy diet with whole grains,  fresh fruits and vegetables Follow up in 8 months

## 2017-07-22 DIAGNOSIS — C787 Secondary malignant neoplasm of liver and intrahepatic bile duct: Secondary | ICD-10-CM

## 2017-07-22 HISTORY — DX: Secondary malignant neoplasm of liver and intrahepatic bile duct: C78.7

## 2017-08-06 DIAGNOSIS — I10 Essential (primary) hypertension: Secondary | ICD-10-CM | POA: Diagnosis not present

## 2017-08-10 ENCOUNTER — Inpatient Hospital Stay (HOSPITAL_COMMUNITY)
Admission: EM | Admit: 2017-08-10 | Discharge: 2017-08-24 | DRG: 436 | Disposition: A | Discharge status: Skilled Nursing Facility | Payer: Medicare HMO | Attending: Internal Medicine | Admitting: Internal Medicine

## 2017-08-10 ENCOUNTER — Other Ambulatory Visit: Payer: Self-pay

## 2017-08-10 ENCOUNTER — Encounter (HOSPITAL_COMMUNITY): Payer: Self-pay | Admitting: Emergency Medicine

## 2017-08-10 ENCOUNTER — Emergency Department (HOSPITAL_COMMUNITY): Payer: Medicare HMO

## 2017-08-10 DIAGNOSIS — R636 Underweight: Secondary | ICD-10-CM | POA: Diagnosis present

## 2017-08-10 DIAGNOSIS — C787 Secondary malignant neoplasm of liver and intrahepatic bile duct: Secondary | ICD-10-CM | POA: Diagnosis present

## 2017-08-10 DIAGNOSIS — R64 Cachexia: Secondary | ICD-10-CM | POA: Diagnosis present

## 2017-08-10 DIAGNOSIS — G9340 Encephalopathy, unspecified: Secondary | ICD-10-CM | POA: Diagnosis not present

## 2017-08-10 DIAGNOSIS — F1021 Alcohol dependence, in remission: Secondary | ICD-10-CM | POA: Diagnosis present

## 2017-08-10 DIAGNOSIS — K219 Gastro-esophageal reflux disease without esophagitis: Secondary | ICD-10-CM | POA: Diagnosis present

## 2017-08-10 DIAGNOSIS — F05 Delirium due to known physiological condition: Secondary | ICD-10-CM | POA: Diagnosis present

## 2017-08-10 DIAGNOSIS — K7030 Alcoholic cirrhosis of liver without ascites: Secondary | ICD-10-CM | POA: Diagnosis present

## 2017-08-10 DIAGNOSIS — F1027 Alcohol dependence with alcohol-induced persisting dementia: Secondary | ICD-10-CM | POA: Diagnosis present

## 2017-08-10 DIAGNOSIS — D473 Essential (hemorrhagic) thrombocythemia: Secondary | ICD-10-CM | POA: Diagnosis not present

## 2017-08-10 DIAGNOSIS — D649 Anemia, unspecified: Secondary | ICD-10-CM | POA: Diagnosis present

## 2017-08-10 DIAGNOSIS — G40909 Epilepsy, unspecified, not intractable, without status epilepticus: Secondary | ICD-10-CM

## 2017-08-10 DIAGNOSIS — I129 Hypertensive chronic kidney disease with stage 1 through stage 4 chronic kidney disease, or unspecified chronic kidney disease: Secondary | ICD-10-CM | POA: Diagnosis not present

## 2017-08-10 DIAGNOSIS — G312 Degeneration of nervous system due to alcohol: Secondary | ICD-10-CM | POA: Diagnosis present

## 2017-08-10 DIAGNOSIS — N179 Acute kidney failure, unspecified: Secondary | ICD-10-CM | POA: Diagnosis present

## 2017-08-10 DIAGNOSIS — E871 Hypo-osmolality and hyponatremia: Secondary | ICD-10-CM | POA: Diagnosis present

## 2017-08-10 DIAGNOSIS — C220 Liver cell carcinoma: Principal | ICD-10-CM | POA: Diagnosis present

## 2017-08-10 DIAGNOSIS — D75839 Thrombocytosis, unspecified: Secondary | ICD-10-CM | POA: Diagnosis present

## 2017-08-10 DIAGNOSIS — Z7982 Long term (current) use of aspirin: Secondary | ICD-10-CM

## 2017-08-10 DIAGNOSIS — R1031 Right lower quadrant pain: Secondary | ICD-10-CM | POA: Diagnosis not present

## 2017-08-10 DIAGNOSIS — N182 Chronic kidney disease, stage 2 (mild): Secondary | ICD-10-CM | POA: Diagnosis not present

## 2017-08-10 DIAGNOSIS — Z882 Allergy status to sulfonamides status: Secondary | ICD-10-CM

## 2017-08-10 DIAGNOSIS — I10 Essential (primary) hypertension: Secondary | ICD-10-CM | POA: Diagnosis present

## 2017-08-10 DIAGNOSIS — F17210 Nicotine dependence, cigarettes, uncomplicated: Secondary | ICD-10-CM | POA: Diagnosis present

## 2017-08-10 DIAGNOSIS — R109 Unspecified abdominal pain: Secondary | ICD-10-CM | POA: Diagnosis not present

## 2017-08-10 DIAGNOSIS — Z79899 Other long term (current) drug therapy: Secondary | ICD-10-CM

## 2017-08-10 DIAGNOSIS — R509 Fever, unspecified: Secondary | ICD-10-CM

## 2017-08-10 DIAGNOSIS — Z6825 Body mass index (BMI) 25.0-25.9, adult: Secondary | ICD-10-CM

## 2017-08-10 DIAGNOSIS — R160 Hepatomegaly, not elsewhere classified: Secondary | ICD-10-CM

## 2017-08-10 DIAGNOSIS — Z8673 Personal history of transient ischemic attack (TIA), and cerebral infarction without residual deficits: Secondary | ICD-10-CM | POA: Diagnosis not present

## 2017-08-10 DIAGNOSIS — K429 Umbilical hernia without obstruction or gangrene: Secondary | ICD-10-CM | POA: Diagnosis present

## 2017-08-10 DIAGNOSIS — I69298 Other sequelae of other nontraumatic intracranial hemorrhage: Secondary | ICD-10-CM

## 2017-08-10 DIAGNOSIS — Z720 Tobacco use: Secondary | ICD-10-CM

## 2017-08-10 DIAGNOSIS — D630 Anemia in neoplastic disease: Secondary | ICD-10-CM | POA: Diagnosis present

## 2017-08-10 DIAGNOSIS — K7689 Other specified diseases of liver: Secondary | ICD-10-CM | POA: Diagnosis not present

## 2017-08-10 DIAGNOSIS — F0150 Vascular dementia without behavioral disturbance: Secondary | ICD-10-CM | POA: Diagnosis present

## 2017-08-10 HISTORY — DX: Secondary malignant neoplasm of liver and intrahepatic bile duct: C78.7

## 2017-08-10 HISTORY — DX: Chronic kidney disease, stage 2 (mild): N18.2

## 2017-08-10 LAB — COMPREHENSIVE METABOLIC PANEL
ALBUMIN: 2.9 g/dL — AB (ref 3.5–5.0)
ALK PHOS: 260 U/L — AB (ref 38–126)
ALT: 32 U/L (ref 17–63)
ANION GAP: 13 (ref 5–15)
AST: 71 U/L — ABNORMAL HIGH (ref 15–41)
BUN: 26 mg/dL — ABNORMAL HIGH (ref 6–20)
CHLORIDE: 99 mmol/L — AB (ref 101–111)
CO2: 22 mmol/L (ref 22–32)
Calcium: 8.9 mg/dL (ref 8.9–10.3)
Creatinine, Ser: 2 mg/dL — ABNORMAL HIGH (ref 0.61–1.24)
GFR calc Af Amer: 38 mL/min — ABNORMAL LOW (ref >60)
GFR calc non Af Amer: 33 mL/min — ABNORMAL LOW (ref >60)
GLUCOSE: 99 mg/dL (ref 65–99)
POTASSIUM: 4 mmol/L (ref 3.5–5.1)
SODIUM: 134 mmol/L — AB (ref 135–145)
Total Bilirubin: 1.1 mg/dL (ref 0.3–1.2)
Total Protein: 6.6 g/dL (ref 6.5–8.1)

## 2017-08-10 LAB — CBC
HEMATOCRIT: 33.2 % — AB (ref 39.0–52.0)
HEMOGLOBIN: 10.5 g/dL — AB (ref 13.0–17.0)
MCH: 26.9 pg (ref 26.0–34.0)
MCHC: 31.6 g/dL (ref 30.0–36.0)
MCV: 84.9 fL (ref 78.0–100.0)
Platelets: 839 10*3/uL — ABNORMAL HIGH (ref 150–400)
RBC: 3.91 MIL/uL — AB (ref 4.22–5.81)
RDW: 14 % (ref 11.5–15.5)
WBC: 13.3 10*3/uL — ABNORMAL HIGH (ref 4.0–10.5)

## 2017-08-10 LAB — LIPASE, BLOOD: LIPASE: 22 U/L (ref 11–51)

## 2017-08-10 MED ORDER — OXYCODONE-ACETAMINOPHEN 5-325 MG PO TABS
1.0000 | ORAL_TABLET | Freq: Once | ORAL | Status: AC
  Administered 2017-08-10: 1 via ORAL
  Filled 2017-08-10: qty 1

## 2017-08-10 MED ORDER — IOHEXOL 300 MG/ML  SOLN
80.0000 mL | Freq: Once | INTRAMUSCULAR | Status: AC | PRN
  Administered 2017-08-10: 100 mL via INTRAVENOUS

## 2017-08-10 NOTE — ED Provider Notes (Signed)
Patient placed in Quick Look pathway, seen and evaluated   Chief Complaint: abdominal pain  HPI:   Pt with pain to the abdomen for several days, however got worse today after pulling a trash can out. No nausea or vomiting. Normal bowel movements. No urinary symptoms. No fever or chills. Has known umbilical hernia, thinks that is what hurting, but states he can push it in and out. Pain mainly to right side.    ROS: abdominal pain, negative for fever, urinary symptoms, nausea, vomiting.  Physical Exam:   Gen: No distress  Neuro: Awake and Alert  Skin: Warm    Focused Exam: soft umbilical hernia, easily reducible. TTP over RLQ with some guarding.    Initiation of care has begun. The patient has been counseled on the process, plan, and necessity for staying for the completion/evaluation, and the remainder of the medical screening examination  Pt with right sided abdominal pain. TTP in RLQ on exam. Will get labs, CT abd/pelvis. Pt is tachycardic, otherwise normal VS. No SIRS. Will prob need pain management and fluids once in the room.     Jeannett Senior, PA-C 08/10/17 1948    Merrily Pew, MD 08/11/17 0020

## 2017-08-10 NOTE — ED Triage Notes (Signed)
Pt to ED with c/o mid abd pain x's 4 days worse on the right.  Pt denies any nausea or diarrhea.  Pt's last BM this am    Pt st's he fell 5 days ago but does not know if he hit his abd

## 2017-08-11 ENCOUNTER — Emergency Department (HOSPITAL_COMMUNITY): Payer: Medicare HMO

## 2017-08-11 ENCOUNTER — Inpatient Hospital Stay (HOSPITAL_COMMUNITY): Payer: Medicare HMO

## 2017-08-11 ENCOUNTER — Other Ambulatory Visit: Payer: Self-pay

## 2017-08-11 ENCOUNTER — Encounter (HOSPITAL_COMMUNITY): Payer: Self-pay | Admitting: Family Medicine

## 2017-08-11 DIAGNOSIS — N189 Chronic kidney disease, unspecified: Secondary | ICD-10-CM | POA: Diagnosis not present

## 2017-08-11 DIAGNOSIS — K7689 Other specified diseases of liver: Secondary | ICD-10-CM | POA: Diagnosis not present

## 2017-08-11 DIAGNOSIS — D473 Essential (hemorrhagic) thrombocythemia: Secondary | ICD-10-CM

## 2017-08-11 DIAGNOSIS — D630 Anemia in neoplastic disease: Secondary | ICD-10-CM | POA: Diagnosis present

## 2017-08-11 DIAGNOSIS — N179 Acute kidney failure, unspecified: Secondary | ICD-10-CM

## 2017-08-11 DIAGNOSIS — R160 Hepatomegaly, not elsewhere classified: Secondary | ICD-10-CM

## 2017-08-11 DIAGNOSIS — N182 Chronic kidney disease, stage 2 (mild): Secondary | ICD-10-CM | POA: Diagnosis not present

## 2017-08-11 DIAGNOSIS — K429 Umbilical hernia without obstruction or gangrene: Secondary | ICD-10-CM | POA: Diagnosis present

## 2017-08-11 DIAGNOSIS — G4089 Other seizures: Secondary | ICD-10-CM | POA: Diagnosis not present

## 2017-08-11 DIAGNOSIS — F05 Delirium due to known physiological condition: Secondary | ICD-10-CM | POA: Diagnosis present

## 2017-08-11 DIAGNOSIS — I10 Essential (primary) hypertension: Secondary | ICD-10-CM

## 2017-08-11 DIAGNOSIS — Z7982 Long term (current) use of aspirin: Secondary | ICD-10-CM | POA: Diagnosis not present

## 2017-08-11 DIAGNOSIS — D649 Anemia, unspecified: Secondary | ICD-10-CM | POA: Diagnosis not present

## 2017-08-11 DIAGNOSIS — Z8673 Personal history of transient ischemic attack (TIA), and cerebral infarction without residual deficits: Secondary | ICD-10-CM | POA: Diagnosis not present

## 2017-08-11 DIAGNOSIS — C229 Malignant neoplasm of liver, not specified as primary or secondary: Secondary | ICD-10-CM | POA: Diagnosis not present

## 2017-08-11 DIAGNOSIS — J9811 Atelectasis: Secondary | ICD-10-CM | POA: Diagnosis not present

## 2017-08-11 DIAGNOSIS — R64 Cachexia: Secondary | ICD-10-CM | POA: Diagnosis not present

## 2017-08-11 DIAGNOSIS — F17210 Nicotine dependence, cigarettes, uncomplicated: Secondary | ICD-10-CM | POA: Diagnosis present

## 2017-08-11 DIAGNOSIS — I129 Hypertensive chronic kidney disease with stage 1 through stage 4 chronic kidney disease, or unspecified chronic kidney disease: Secondary | ICD-10-CM | POA: Diagnosis not present

## 2017-08-11 DIAGNOSIS — R69 Illness, unspecified: Secondary | ICD-10-CM | POA: Diagnosis not present

## 2017-08-11 DIAGNOSIS — Z882 Allergy status to sulfonamides status: Secondary | ICD-10-CM | POA: Diagnosis not present

## 2017-08-11 DIAGNOSIS — I69298 Other sequelae of other nontraumatic intracranial hemorrhage: Secondary | ICD-10-CM | POA: Diagnosis not present

## 2017-08-11 DIAGNOSIS — D75839 Thrombocytosis, unspecified: Secondary | ICD-10-CM | POA: Diagnosis present

## 2017-08-11 DIAGNOSIS — R270 Ataxia, unspecified: Secondary | ICD-10-CM | POA: Diagnosis not present

## 2017-08-11 DIAGNOSIS — R636 Underweight: Secondary | ICD-10-CM | POA: Diagnosis present

## 2017-08-11 DIAGNOSIS — E871 Hypo-osmolality and hyponatremia: Secondary | ICD-10-CM | POA: Diagnosis not present

## 2017-08-11 DIAGNOSIS — K219 Gastro-esophageal reflux disease without esophagitis: Secondary | ICD-10-CM | POA: Diagnosis present

## 2017-08-11 DIAGNOSIS — C220 Liver cell carcinoma: Secondary | ICD-10-CM | POA: Diagnosis not present

## 2017-08-11 DIAGNOSIS — Z720 Tobacco use: Secondary | ICD-10-CM | POA: Diagnosis not present

## 2017-08-11 DIAGNOSIS — G312 Degeneration of nervous system due to alcohol: Secondary | ICD-10-CM | POA: Diagnosis present

## 2017-08-11 DIAGNOSIS — D3502 Benign neoplasm of left adrenal gland: Secondary | ICD-10-CM | POA: Diagnosis not present

## 2017-08-11 DIAGNOSIS — G40909 Epilepsy, unspecified, not intractable, without status epilepticus: Secondary | ICD-10-CM

## 2017-08-11 DIAGNOSIS — C227 Other specified carcinomas of liver: Secondary | ICD-10-CM | POA: Diagnosis not present

## 2017-08-11 DIAGNOSIS — C787 Secondary malignant neoplasm of liver and intrahepatic bile duct: Secondary | ICD-10-CM | POA: Diagnosis not present

## 2017-08-11 DIAGNOSIS — R1031 Right lower quadrant pain: Secondary | ICD-10-CM | POA: Diagnosis present

## 2017-08-11 DIAGNOSIS — F1027 Alcohol dependence with alcohol-induced persisting dementia: Secondary | ICD-10-CM | POA: Diagnosis present

## 2017-08-11 DIAGNOSIS — Z79899 Other long term (current) drug therapy: Secondary | ICD-10-CM | POA: Diagnosis not present

## 2017-08-11 DIAGNOSIS — F0150 Vascular dementia without behavioral disturbance: Secondary | ICD-10-CM | POA: Diagnosis present

## 2017-08-11 DIAGNOSIS — Z6825 Body mass index (BMI) 25.0-25.9, adult: Secondary | ICD-10-CM | POA: Diagnosis not present

## 2017-08-11 DIAGNOSIS — I6930 Unspecified sequelae of cerebral infarction: Secondary | ICD-10-CM | POA: Diagnosis not present

## 2017-08-11 DIAGNOSIS — K7030 Alcoholic cirrhosis of liver without ascites: Secondary | ICD-10-CM | POA: Diagnosis present

## 2017-08-11 DIAGNOSIS — F1021 Alcohol dependence, in remission: Secondary | ICD-10-CM | POA: Diagnosis present

## 2017-08-11 DIAGNOSIS — G9340 Encephalopathy, unspecified: Secondary | ICD-10-CM | POA: Diagnosis not present

## 2017-08-11 LAB — COMPREHENSIVE METABOLIC PANEL
ALBUMIN: 2.6 g/dL — AB (ref 3.5–5.0)
ALT: 26 U/L (ref 17–63)
ANION GAP: 13 (ref 5–15)
AST: 57 U/L — ABNORMAL HIGH (ref 15–41)
Alkaline Phosphatase: 225 U/L — ABNORMAL HIGH (ref 38–126)
BILIRUBIN TOTAL: 0.9 mg/dL (ref 0.3–1.2)
BUN: 27 mg/dL — ABNORMAL HIGH (ref 6–20)
CALCIUM: 8.5 mg/dL — AB (ref 8.9–10.3)
CO2: 21 mmol/L — AB (ref 22–32)
Chloride: 101 mmol/L (ref 101–111)
Creatinine, Ser: 1.92 mg/dL — ABNORMAL HIGH (ref 0.61–1.24)
GFR calc non Af Amer: 34 mL/min — ABNORMAL LOW (ref >60)
GFR, EST AFRICAN AMERICAN: 40 mL/min — AB (ref >60)
GLUCOSE: 85 mg/dL (ref 65–99)
Potassium: 4.1 mmol/L (ref 3.5–5.1)
SODIUM: 135 mmol/L (ref 135–145)
TOTAL PROTEIN: 5.9 g/dL — AB (ref 6.5–8.1)

## 2017-08-11 LAB — DIFFERENTIAL
ABS IMMATURE GRANULOCYTES: 0.1 10*3/uL (ref 0.0–0.1)
BASOS PCT: 0 %
Basophils Absolute: 0.1 10*3/uL (ref 0.0–0.1)
EOS ABS: 0.1 10*3/uL (ref 0.0–0.7)
Eosinophils Relative: 1 %
Immature Granulocytes: 0 %
Lymphocytes Relative: 8 %
Lymphs Abs: 1 10*3/uL (ref 0.7–4.0)
Monocytes Absolute: 0.8 10*3/uL (ref 0.1–1.0)
Monocytes Relative: 7 %
NEUTROS ABS: 10.3 10*3/uL — AB (ref 1.7–7.7)
NEUTROS PCT: 84 %

## 2017-08-11 LAB — PROTIME-INR
INR: 1.15
Prothrombin Time: 14.7 seconds (ref 11.4–15.2)

## 2017-08-11 MED ORDER — NICOTINE 21 MG/24HR TD PT24
21.0000 mg | MEDICATED_PATCH | Freq: Once | TRANSDERMAL | Status: DC
  Administered 2017-08-11: 21 mg via TRANSDERMAL
  Filled 2017-08-11: qty 1

## 2017-08-11 MED ORDER — ONDANSETRON HCL 4 MG PO TABS: 4.0000 mg | ORAL_TABLET | Freq: Four times a day (QID) | ORAL | Status: DC | PRN

## 2017-08-11 MED ORDER — NICOTINE 14 MG/24HR TD PT24
14.0000 mg | MEDICATED_PATCH | Freq: Once | TRANSDERMAL | Status: AC
  Administered 2017-08-11: 14 mg via TRANSDERMAL
  Filled 2017-08-11: qty 1

## 2017-08-11 MED ORDER — ACETAMINOPHEN 325 MG PO TABS
650.0000 mg | ORAL_TABLET | Freq: Four times a day (QID) | ORAL | Status: DC | PRN
Start: 2017-08-11 — End: 2017-08-24

## 2017-08-11 MED ORDER — LEVETIRACETAM 500 MG PO TABS
500.0000 mg | ORAL_TABLET | Freq: Two times a day (BID) | ORAL | Status: DC
  Administered 2017-08-11 – 2017-08-16 (×12): 500 mg via ORAL
  Filled 2017-08-11 (×13): qty 1

## 2017-08-11 MED ORDER — SODIUM CHLORIDE 0.9 % IV BOLUS
500.0000 mL | Freq: Once | INTRAVENOUS | Status: AC
  Administered 2017-08-11: 500 mL via INTRAVENOUS

## 2017-08-11 MED ORDER — NICOTINE 21 MG/24HR TD PT24
21.0000 mg | MEDICATED_PATCH | Freq: Every day | TRANSDERMAL | Status: DC
  Filled 2017-08-11: qty 1

## 2017-08-11 MED ORDER — HYDROMORPHONE HCL 2 MG/ML IJ SOLN
0.5000 mg | Freq: Once | INTRAMUSCULAR | Status: AC
  Administered 2017-08-11: 0.5 mg via INTRAVENOUS
  Filled 2017-08-11: qty 1

## 2017-08-11 MED ORDER — ONDANSETRON HCL 4 MG/2ML IJ SOLN
4.0000 mg | Freq: Once | INTRAMUSCULAR | Status: AC
  Administered 2017-08-11: 4 mg via INTRAVENOUS
  Filled 2017-08-11: qty 2

## 2017-08-11 MED ORDER — MORPHINE SULFATE (PF) 4 MG/ML IV SOLN
4.0000 mg | INTRAVENOUS | Status: DC | PRN
  Administered 2017-08-11 – 2017-08-12 (×6): 4 mg via INTRAVENOUS
  Filled 2017-08-11 (×6): qty 1

## 2017-08-11 MED ORDER — ONDANSETRON HCL 4 MG/2ML IJ SOLN
4.0000 mg | Freq: Four times a day (QID) | INTRAMUSCULAR | Status: DC | PRN
  Administered 2017-08-11 (×2): 4 mg via INTRAVENOUS
  Filled 2017-08-11 (×2): qty 2

## 2017-08-11 MED ORDER — ACETAMINOPHEN 650 MG RE SUPP: 650.0000 mg | Freq: Four times a day (QID) | RECTAL | Status: DC | PRN

## 2017-08-11 MED ORDER — DIAZEPAM 5 MG PO TABS
5.0000 mg | ORAL_TABLET | Freq: Once | ORAL | Status: AC
  Administered 2017-08-11: 5 mg via ORAL
  Filled 2017-08-11: qty 1

## 2017-08-11 MED ORDER — OXYCODONE HCL 5 MG PO TABS
5.0000 mg | ORAL_TABLET | ORAL | Status: DC | PRN
  Administered 2017-08-11 – 2017-08-12 (×3): 5 mg via ORAL
  Filled 2017-08-11 (×3): qty 1

## 2017-08-11 MED ORDER — SODIUM CHLORIDE 0.9 % IV SOLN
INTRAVENOUS | Status: AC
  Administered 2017-08-11 (×2): via INTRAVENOUS

## 2017-08-11 MED ORDER — SENNOSIDES-DOCUSATE SODIUM 8.6-50 MG PO TABS
1.0000 | ORAL_TABLET | Freq: Every evening | ORAL | Status: DC | PRN
Start: 2017-08-11 — End: 2017-08-24

## 2017-08-11 MED ORDER — HEPARIN SODIUM (PORCINE) 5000 UNIT/ML IJ SOLN
5000.0000 [IU] | Freq: Three times a day (TID) | INTRAMUSCULAR | Status: DC
  Administered 2017-08-11 – 2017-08-24 (×38): 5000 [IU] via SUBCUTANEOUS
  Filled 2017-08-11 (×37): qty 1

## 2017-08-11 NOTE — ED Notes (Signed)
Lunch tray delivered to pt. Daughter aware pt's diet is Regular and that CT Tech advised Biopsy will more than likely be performed tomorrow.

## 2017-08-11 NOTE — Progress Notes (Signed)
Gabriel Romero is a 67 y.o. male patient admitted from ED awake, alert - oriented  X 1, oriented to self - no acute distress noted.  VSS - Blood pressure 138/76, pulse 98, temperature 98.3 F (36.8 C), temperature source Oral, resp. rate 20, height 5\' 7"  (1.702 m), weight 57.1 kg (125 lb 14.1 oz), SpO2 98 %.    IV in place, occlusive dsg intact without redness.  Orientation to room, and floor completed with information packet given to patient/family.  Patient declined safety video at this time.  Admission INP armband ID verified with patient/family, and in place.   SR up x 2, fall assessment complete, with patient and family able to verbalize understanding of risk associated with falls, and verbalized understanding to call nsg before up out of bed.  Call light within reach, patient able to voice, and demonstrate understanding.  Skin, clean-dry- intact without evidence of bruising, or skin tears.   No evidence of skin break down noted on exam.   Will cont to eval and treat per MD orders.  Richardean Chimera, RN 08/11/2017 7:28 PM

## 2017-08-11 NOTE — ED Notes (Signed)
Urine needed

## 2017-08-11 NOTE — ED Notes (Signed)
Order received from Dr Rockne Menghini for Nicotine Patch 14mg /hr daily and advise daughter Lisinopril/HCTZ being held d/t abnormal kidney function results and CT dye.

## 2017-08-11 NOTE — Consult Note (Signed)
Chief Complaint: Patient was seen in consultation today for liver lesion  Referring Physician(s): Dr. Rockne Menghini  Supervising Physician: Arne Cleveland  Patient Status: [facility] - In-pt  History of Present Illness: Gabriel Romero is a 67 y.o. male with past medical history of alcohol abuse, HTN, CVA who presented to [facility] ED with severe abdominal pain.   CT Abdomen/Pelvis showed: 1. Multiple large hepatic masses that are new compared to the prior studies. 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 These lesions could be better characterized with abdominal MRI with and without contrast. 2. Short segment transverse colon contained within small periumbilical hernia without evidence of obstruction. 3.  Aortic Atherosclerosis (ICD10-I70.0).  IR consulted for liver lesion biopsy at the request of Dr. Rockne Menghini.  Patient to remain inpatient at this time for pain management and further work-up.  Past Medical History:  Diagnosis Date  . ETOH abuse   . Hypertension   . Seizures ([place])   . Stroke [facility])     History reviewed. No pertinent surgical history.  Allergies: Sulfa antibiotics  Medications: Prior to Admission medications   Medication Sig Start Date End Date Taking? Authorizing Provider  aspirin EC 81 MG tablet Take 1 tablet (81 mg total) by mouth daily. 08/26/16  Yes Garvin Fila, MD  atorvastatin (LIPITOR) 20 MG tablet Take 1 tablet (20 mg total) by mouth daily at 6 PM. 06/28/16  Yes Eugenie Filler, MD  levETIRAcetam (KEPPRA) 500 MG tablet Take 1 tablet (500 mg total) by mouth 2 (two) times daily. 10/24/16  Yes Dennie Bible, NP  lisinopril-hydrochlorothiazide (PRINZIDE,ZESTORETIC) 20-12.5 MG tablet Take 1 tablet by mouth daily.  04/02/17  Yes [provider]  pantoprazole (PROTONIX) 40 MG tablet Take 1 tablet (40 mg total) by mouth daily. 06/29/16  Yes Eugenie Filler, MD     Family History  Problem Relation Age of  Onset  . Dementia Mother   . Hypertension Father   . Colon cancer Neg Hx     Social History   Socioeconomic History  . Marital status: Single    Spouse name: Not on file  . Number of children: 2  . Years of education: Not on file  . Highest education level: Not on file  Occupational History  . Occupation: Bread delivery    Comment: [employer]  Social Needs  . Financial resource strain: Not on file  . Food insecurity:    Worry: Not on file    Inability: Not on file  . Transportation needs:    Medical: Not on file    Non-medical: Not on file  Tobacco Use  . Smoking status: Current Some Day Smoker    Packs/day: 1.50    Types: Cigarettes  . Smokeless tobacco: Never Used  Substance and Sexual Activity  . Alcohol use: Not Currently    Alcohol/week: 7.2 oz    Types: 12 Cans of beer per week    Comment: last quit march 2018  . Drug use: Yes    Frequency: 7.0 times per week    Types: Marijuana    Comment: every 3  months  . Sexual activity: Not on file  Lifestyle  . Physical activity:    Days per week: Not on file    Minutes per session: Not on file  . Stress: Not on file  Relationships  . Social connections:    Talks on phone: Not on file    Gets together: Not on  file    Attends religious service: Not on file    Active member of club or organization: Not on file    Attends meetings of clubs or organizations: Not on file    Relationship status: Not on file  Other Topics Concern  . Not on file  Social History Narrative   Lives with son     Review of Systems: A 12 point ROS discussed and pertinent positives are indicated in the HPI above.  All other systems are negative.  Review of Systems  Constitutional: Negative for fatigue and fever.  Respiratory: Negative for cough and shortness of breath.   Cardiovascular: Negative for chest pain.  Gastrointestinal: Negative for abdominal pain.  Psychiatric/Behavioral: Negative for behavioral problems and confusion.      Vital Signs: BP (!) 157/75   Pulse 79   Temp 98.9 F (37.2 C) (Oral)   Resp (!) 23   Ht 5\' 7"  (1.702 m)   Wt 145 lb (65.8 kg)   SpO2 97%   BMI 22.71 kg/m   Physical Exam  Constitutional: He is oriented to person, place, and time. He appears well-developed.  Cardiovascular: Normal rate, regular rhythm and normal heart sounds.  Pulmonary/Chest: Effort normal and breath sounds normal. No respiratory distress.  Abdominal: He exhibits no distension. There is tenderness. There is guarding.  Neurological: He is alert and oriented to person, place, and time.  Skin: Skin is warm and dry.  Psychiatric: He has a normal mood and affect. His behavior is normal. Judgment and thought content normal.  Nursing note and vitals reviewed.    MD Evaluation Airway: WNL Heart: WNL Abdomen: WNL Chest/ Lungs: WNL ASA  Classification: 3 Mallampati/Airway Score: One   Imaging: Dg Chest 2 View  Result Date: 08/11/2017 CLINICAL DATA:  Smoking history. Hepatic lesions on CT earlier today. EXAM: CHEST - 2 VIEW COMPARISON:  No prior chest imaging. Lung bases from abdominal CT yesterday. FINDINGS: Heart size upper normal. Atherosclerosis of the aortic arch. Vague right infrahilar soft tissue prominence, nonspecific. No pulmonary edema. No dominant pulmonary mass or consolidation. No pleural effusion or pneumothorax. IMPRESSION: Vague right infrahilar soft tissue fullness, likely overlapping vascular structures, without suspicious abnormality on included lung bases from abdominal CT. No dominant pulmonary mass or suspicious nodules visualized radiographically. Electronically Signed   By: Jeb Levering M.D.   On: 08/11/2017 03:45   Ct Chest Wo Contrast  Result Date: 08/11/2017 CLINICAL DATA:  Metastatic disease. EXAM: CT CHEST WITHOUT CONTRAST TECHNIQUE: Multidetector CT imaging of the chest was performed following the standard protocol without IV contrast. COMPARISON:  CT scan of Aug 10, 2017.  FINDINGS: Cardiovascular: Atherosclerosis of thoracic aorta is noted without aneurysm formation. Extensive coronary artery calcifications are noted. Normal cardiac size. No pericardial effusion. Mediastinum/Nodes: No enlarged mediastinal or axillary lymph nodes. Thyroid gland, trachea, and esophagus demonstrate no significant findings. Lungs/Pleura: No pneumothorax or pleural effusion is noted. Mild bilateral posterior basilar subsegmental atelectasis is noted. Upper Abdomen: Large low density lesions are noted in the visualized liver consistent with metastatic disease. Left adrenal mass is again noted. Musculoskeletal: No chest wall mass or suspicious bone lesions identified. IMPRESSION: Extensive coronary artery calcifications are noted suggesting coronary artery disease. Mild bilateral posterior basilar subsegmental atelectasis is noted. Hepatic lesions are again noted consistent with metastatic disease. Left adrenal mass is noted. Aortic Atherosclerosis (ICD10-I70.0). Electronically Signed   By: Marijo Conception, M.D.   On: 08/11/2017 12:09   Ct Abdomen Pelvis W Contrast  Addendum Date:  08/10/2017   ADDENDUM REPORT: 08/10/2017 23:02 ADDENDUM: Additionally, as stated in the findings, there is a left adrenal mass that has decreased in size compared to the prior study. Electronically Signed   By: Ulyses Jarred M.D.   On: 08/10/2017 23:02   Result Date: 08/10/2017 CLINICAL DATA:  Abdominal pain EXAM: CT ABDOMEN AND PELVIS WITH CONTRAST TECHNIQUE: Multidetector CT imaging of the abdomen and pelvis was performed using the standard protocol following bolus administration of intravenous contrast. CONTRAST:  140mL OMNIPAQUE IOHEXOL 300 MG/ML  SOLN COMPARISON:  CT abdomen pelvis 03/04/2008 FINDINGS: LOWER CHEST: No basilar pulmonary nodules or pleural effusion. No apical pericardial effusion. HEPATOBILIARY: There are multiple (4-5) large hepatic masses, measuring up to approximately 9 x 9 cm. These are concentrated  within the right hepatic lobe. The largest lesion is at the inferior margin of the liver. Normal gallbladder. PANCREAS: Normal parenchymal contours without ductal dilatation. No peripancreatic fluid collection. SPLEEN: Normal. ADRENALS/URINARY TRACT: --Adrenal glands: Heterogeneous left adrenal mass measures 3.8 x 3.5 cm, decreased in size from the prior examination. --Right kidney/ureter: Exophytic right renal cyst is increased in size to 6.5 cm. --Left kidney/ureter: No hydronephrosis, nephroureterolithiasis, perinephric stranding or solid renal mass. --Urinary bladder: Normal for degree of distention STOMACH/BOWEL: --Stomach/Duodenum: No hiatal hernia or other gastric abnormality. Normal duodenal course. --Small bowel: No dilatation or inflammation. --Colon: There is a short segment of transverse colon contained within a small periumbilical hernia. There is sigmoid diverticulosis without acute inflammation. --Appendix: Not visualized. No right lower quadrant inflammation or free fluid. VASCULAR/LYMPHATIC: Atherosclerotic calcification is present within the non-aneurysmal abdominal aorta, without hemodynamically significant stenosis. The portal vein, splenic vein, superior mesenteric vein and IVC are patent. No abdominal or pelvic lymphadenopathy. REPRODUCTIVE: No free fluid in the pelvis. MUSCULOSKELETAL. No bony spinal canal stenosis or focal osseous abnormality. OTHER: None. IMPRESSION: 1. Multiple large hepatic masses that are new compared to the prior studies. 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 These lesions could be better characterized with abdominal MRI with and without contrast. 2. Short segment transverse colon contained within small periumbilical hernia without evidence of obstruction. 3.  Aortic Atherosclerosis (ICD10-I70.0). Electronically Signed: By: Ulyses Jarred M.D. On: 08/10/2017 22:48    Labs:  CBC: Recent Labs    08/10/17 1948    WBC 13.3*  HGB 10.5*  HCT 33.2*  PLT 839*    COAGS: Recent Labs    08/11/17 0327  INR 1.15    BMP: Recent Labs    08/10/17 1948 08/11/17 0607  NA 134* 135  K 4.0 4.1  CL 99* 101  CO2 22 21*  GLUCOSE 99 85  BUN 26* 27*  CALCIUM 8.9 8.5*  CREATININE 2.00* 1.92*  GFRNONAA 33* 34*  GFRAA 38* 40*    LIVER FUNCTION TESTS: Recent Labs    08/10/17 1948 08/11/17 0607  BILITOT 1.1 0.9  AST 71* 57*  ALT 32 26  ALKPHOS 260* 225*  PROT 6.6 5.9*  ALBUMIN 2.9* 2.6*    TUMOR MARKERS: No results for input(s): AFPTM, CEA, CA199, CHROMGRNA in the last 8760 hours.  Assessment and Plan: Liver lesions Patient with liver lesions found by CT scan.  IR consulted for liver lesion biopsy at the request of Dr. Rockne Menghini.  Patient will be made NPO after midnight tonight.  Plan to proceed with biopsy as schedule allows.  Patient and daughter aware of plan to proceed as inpatient when able, and that an outpatient biopsy can be arranged  if patient discharges prior to procedure being done.   Risks and benefits discussed with the patient including, but not limited to bleeding, infection, damage to adjacent structures or low yield requiring additional tests.  All of the patient's questions were answered, patient is agreeable to proceed. Consent signed and in chart.  Thank you for this interesting consult.  I greatly enjoyed meeting ISAM UNREIN and look forward to participating in their care.  A copy of this report was sent to the requesting provider on this date.  Electronically Signed: Docia Barrier, PA 08/11/2017, 4:53 PM   I spent a total of 40 Minutes    in face to face in clinical consultation, greater than 50% of which was counseling/coordinating care for liver lesion.

## 2017-08-11 NOTE — ED Notes (Signed)
Pt returned from CT. Monitor intact to pt. Daughter at bedside.

## 2017-08-11 NOTE — ED Notes (Signed)
Admitting MD in w pt

## 2017-08-11 NOTE — ED Notes (Signed)
Transported to CT via stretcher. Daughter at bedside.

## 2017-08-11 NOTE — ED Notes (Signed)
Regular dinner tray ordered 

## 2017-08-11 NOTE — ED Notes (Signed)
Pt needs urine to be sent, lab tech accidentally clicked off. All urine labs remains to be sent

## 2017-08-11 NOTE — Progress Notes (Signed)
Progress Note    Gabriel Romero  WUJ:[id number] DOB: December 21, 1950  DOA: 08/10/2017 PCP: [facility]    Brief Narrative:   Chief complaint: F/U RUQ pain  Medical records reviewed and are as summarized below:  Gabriel Romero is an 67 y.o. 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  CT of the abdomen and pelvis showed multiple large liver masses consistent with metastatic disease from an unknown primary.  Also found to have AKI.  Assessment/Plan:   Principal Problem:   Liver metastasis ([place]) associated with right upper quadrant pain CT abdomen personally reviewed and shows liver masses concerning for metastasis. Needs malignancy work-up.  Will obtain CT of the chest.  Follow-up AFP. Check CEA, PSA.  IR consult for liver biopsy.  Pain control with morphine and oxycodone as needed. Has required parenteral IV pain management due to severity of RUQ pain.  Active Problems:   Seizure disorder ([place]) Continue Keppra.    HTN (hypertension) Lisinopril/HCTZ on hold given AKI.    Acute renal failure superimposed on stage 2 chronic kidney disease (HCC) Baseline creatinine appears to be around 1.2.  Admitting creatinine 2.0 with mild improvement overnight.  Continue to hold lisinopril/HCTZ and provide gentle hydration.    Thrombocytosis (HCC) Possibly reactive. Monitor.    Normocytic anemia Likely AOCD.    Weight loss/underweight Body mass index is 22.71 kg/m. Dietician consult. 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   Family Communication/Anticipated D/C date and plan/Code Status   DVT prophylaxis: Heparin ordered. Code Status: Full Code.  Family Communication: Daughter at bedside. Disposition Plan: Unclear, depends on work up findings.   Medical Consultants:    IR   Anti-Infectives:     None  Subjective:   The patient reports significant weight loss, severe RUQ pain.  No N/V.  Objective:    Vitals:   08/11/17 0330 08/11/17 0345 08/11/17 0400 08/11/17 0600  BP: (!) 146/83 (!) 145/70 125/75 132/69  Pulse:    65  Resp:  17 19 14   Temp:      TempSrc:      SpO2:    100%  Weight:      Height:        Intake/Output Summary (Last 24 hours) at 08/11/2017 0843 Last data filed at 08/11/2017 0810 Gross per 24 hour  Intake 500 ml  Output 1 ml  Net 499 ml   Filed Weights   08/10/17 1935  Weight: 65.8 kg (145 lb)    Exam: General: No acute distress. Cardiovascular: Heart sounds show a regular rate, and rhythm. No gallops or rubs. No murmurs. No JVD. Lungs: Clear to auscultation bilaterally with good air movement. No rales, rhonchi or wheezes. Abdomen: Extremely tender RUQ with guarding, nondistended with normal active bowel sounds. Unable to palpate enough to determine if masses/splenomegaly present due to patient's tenderness. Neurological: Alert and oriented 3. Moves all extremities 4 with equal strength. Cranial nerves II through XII grossly intact. Skin: Warm and dry. No rashes or lesions. Extremities: No clubbing or cyanosis. No edema. Pedal pulses 2+. Psychiatric: Mood and affect are normal. Insight and judgment are impaired.   Data Reviewed:   I have personally reviewed following labs and imaging studies:  Labs: Labs show the following:   Basic Metabolic Panel: Recent Labs  Lab 08/10/17 1948 08/11/17 0607  NA 134* 135  K 4.0  4.1  CL 99* 101  CO2 22 21*  GLUCOSE 99 85  BUN 26* 27*  CREATININE 2.00* 1.92*  CALCIUM 8.9 8.5*   GFR Estimated Creatinine Clearance: 34.7 mL/min (A) (by C-G formula based on SCr of 1.92 mg/dL (H)). Liver Function Tests: Recent Labs  Lab 08/10/17 1948 08/11/17 0607  AST 71* 57*  ALT 32 26  ALKPHOS 260* 225*  BILITOT 1.1 0.9  PROT 6.6 5.9*  ALBUMIN 2.9* 2.6*   Recent Labs  Lab 08/10/17 1948  LIPASE  22    Coagulation profile Recent Labs  Lab 08/11/17 0327  INR 1.15    CBC: Recent Labs  Lab 08/10/17 1948 08/11/17 0327  WBC 13.3*  --   NEUTROABS  --  10.3*  HGB 10.5*  --   HCT 33.2*  --   MCV 84.9  --   PLT 839*  --     Microbiology No results found for this or any previous visit (from the past 240 hour(s)).  Procedures and diagnostic studies:  Dg Chest 2 View  Result Date: 08/11/2017 CLINICAL DATA:  Smoking history. Hepatic lesions on CT earlier today. EXAM: CHEST - 2 VIEW COMPARISON:  No prior chest imaging. Lung bases from abdominal CT yesterday. FINDINGS: Heart size upper normal. Atherosclerosis of the aortic arch. Vague right infrahilar soft tissue prominence, nonspecific. No pulmonary edema. No dominant pulmonary mass or consolidation. No pleural effusion or pneumothorax. IMPRESSION: Vague right infrahilar soft tissue fullness, likely overlapping vascular structures, without suspicious abnormality on included lung bases from abdominal CT. No dominant pulmonary mass or suspicious nodules visualized radiographically. Electronically Signed   By: Jeb Levering M.D.   On: 08/11/2017 03:45   Ct Abdomen Pelvis W Contrast  Addendum Date: 08/10/2017   ADDENDUM REPORT: 08/10/2017 23:02 ADDENDUM: Additionally, as stated in the findings, there is a left adrenal mass that has decreased in size compared to the prior study. Electronically Signed   By: Ulyses Jarred M.D.   On: 08/10/2017 23:02   Result Date: 08/10/2017 CLINICAL DATA:  Abdominal pain EXAM: CT ABDOMEN AND PELVIS WITH CONTRAST TECHNIQUE: Multidetector CT imaging of the abdomen and pelvis was performed using the standard protocol following bolus administration of intravenous contrast. CONTRAST:  165mL OMNIPAQUE IOHEXOL 300 MG/ML  SOLN COMPARISON:  CT abdomen pelvis 03/04/2008 FINDINGS: LOWER CHEST: No basilar pulmonary nodules or pleural effusion. No apical pericardial effusion. HEPATOBILIARY: There are multiple (4-5)  large hepatic masses, measuring up to approximately 9 x 9 cm. These are concentrated within the right hepatic lobe. The largest lesion is at the inferior margin of the liver. Normal gallbladder. PANCREAS: Normal parenchymal contours without ductal dilatation. No peripancreatic fluid collection. SPLEEN: Normal. ADRENALS/URINARY TRACT: --Adrenal glands: Heterogeneous left adrenal mass measures 3.8 x 3.5 cm, decreased in size from the prior examination. --Right kidney/ureter: Exophytic right renal cyst is increased in size to 6.5 cm. --Left kidney/ureter: No hydronephrosis, nephroureterolithiasis, perinephric stranding or solid renal mass. --Urinary bladder: Normal for degree of distention STOMACH/BOWEL: --Stomach/Duodenum: No hiatal hernia or other gastric abnormality. Normal duodenal course. --Small bowel: No dilatation or inflammation. --Colon: There is a short segment of transverse colon contained within a small periumbilical hernia. There is sigmoid diverticulosis without acute inflammation. --Appendix: Not visualized. No right lower quadrant inflammation or free fluid. VASCULAR/LYMPHATIC: Atherosclerotic calcification is present within the non-aneurysmal abdominal aorta, without hemodynamically significant stenosis. The portal vein, splenic vein, superior mesenteric vein and IVC are patent. No abdominal or pelvic lymphadenopathy. REPRODUCTIVE: No free fluid in the pelvis.  MUSCULOSKELETAL. No bony spinal canal stenosis or focal osseous abnormality. OTHER: None. IMPRESSION: 1. Multiple large hepatic masses that are new compared to the prior studies. 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 These lesions could be better characterized with abdominal MRI with and without contrast. 2. Short segment transverse colon contained within small periumbilical hernia without evidence of obstruction. 3.  Aortic Atherosclerosis (ICD10-I70.0). Electronically Signed: By: Ulyses Jarred M.D. On: 08/10/2017 22:48    Medications:   . heparin  5,000 Units Subcutaneous Q8H  . levETIRAcetam  500 mg Oral BID  . nicotine  21 mg Transdermal Once   Continuous Infusions: . sodium chloride 100 mL/hr at 08/11/17 0634     LOS: 0 days   Jacquelynn Cree  [facility] Pager [phone]. If unable to reach me by pager, please call my cell phone at [phone].  *Please refer to [email or web address], password [email or web address] to get updated schedule on who will round on this patient, as hospitalists switch teams weekly. If 7PM-7AM, please contact night-coverage at [email or web address], password [email or web address] for any overnight needs.  08/11/2017, 8:43 AM

## 2017-08-11 NOTE — H&P (Signed)
History and Physical    Gabriel Romero:[id number] DOB: 02/03/1951 DOA: 08/10/2017  PCP: [facility]   Patient coming from: Home  Chief Complaint: RUQ pain   HPI: Gabriel Romero is a 67 y.o. male with medical history significant for history of intracranial hemorrhage, seizure disorder, alcohol abuse with 1 year of abstinence, and hypertension, now presenting to the emergency department for evaluation of abdominal pain.  Patient reports that he had been in his usual state of health until approximately 4 days ago when he developed pain in the mid abdomen, particularly in the right upper quadrant.  He denies fevers, chills, nausea, vomiting, or diarrhea.  He reports having a normal bowel movement yesterday.  He denies change in bowel habits or melena.  Denies any family history of GI malignancy.  ED Course: Upon arrival to the ED, patient is found to be afebrile, saturating well on room air, and with vitals otherwise stable.  Chemistry panel is notable for a creatinine of 2.00, up from an apparent baseline of 1.2.  CBC is notable for leukocytosis to 13,300, normocytic anemia with hemoglobin of 10.5, and a new thrombocytosis with platelets 839,000.  CT of the abdomen and pelvis reveals multiple large liver masses most consistent with metastatic disease from unknown primary.  Chest x-ray features a vague right infrahilar soft tissue fullness that likely represents overlapping vascular structures.  Patient was treated with 500 cc normal saline, Dilaudid, Percocet, and Zofran in the ED.  He remains hemodynamically stable and will be admitted for ongoing evaluation and management of acute kidney injury, noted to have multiple large new liver masses.  Review of Systems:  All other systems reviewed and apart from HPI, are negative.  Past Medical History:  Diagnosis Date  . ETOH abuse   . Hypertension   . Seizures ([place])   . Stroke [facility])     History reviewed. No pertinent  surgical history.   reports that he has been smoking cigarettes.  He has been smoking about 1.50 packs per day. He has never used smokeless tobacco. He reports that he drank about 7.2 oz of alcohol per week. He reports that he has current or past drug history. Drug: Marijuana. Frequency: 7.00 times per week.  Allergies  Allergen Reactions  . Sulfa Antibiotics Rash    Family History  Problem Relation Age of Onset  . Dementia Mother   . Hypertension Father   . Colon cancer Neg Hx      Prior to Admission medications   Medication Sig Start Date End Date Taking? Authorizing Provider  aspirin EC 81 MG tablet Take 1 tablet (81 mg total) by mouth daily. 08/26/16  Yes Garvin Fila, MD  atorvastatin (LIPITOR) 20 MG tablet Take 1 tablet (20 mg total) by mouth daily at 6 PM. 06/28/16  Yes Eugenie Filler, MD  levETIRAcetam (KEPPRA) 500 MG tablet Take 1 tablet (500 mg total) by mouth 2 (two) times daily. 10/24/16  Yes Dennie Bible, NP  lisinopril-hydrochlorothiazide (PRINZIDE,ZESTORETIC) 20-12.5 MG tablet Take 1 tablet by mouth daily.  04/02/17  Yes [provider]  pantoprazole (PROTONIX) 40 MG tablet Take 1 tablet (40 mg total) by mouth daily. 06/29/16  Yes Eugenie Filler, MD    Physical Exam: Vitals:   08/11/17 0315 08/11/17 0330 08/11/17 0345 08/11/17 0400  BP: 130/69 (!) 146/83 (!) 145/70 125/75  Pulse: 66     Resp: 18  17 19   Temp:      TempSrc:  SpO2: 94%     Weight:      Height:          Constitutional: NAD, appears to be in pain Eyes: PERTLA, lids and conjunctivae normal ENMT: Mucous membranes are moist. Posterior pharynx clear of any exudate or lesions.   Neck: normal, supple, no masses, no thyromegaly Respiratory: clear to auscultation bilaterally, no wheezing, no crackles. Normal respiratory effort.   Cardiovascular: S1 & S2 heard, regular rate and rhythm. No extremity edema.  Abdomen: No distension, soft, tender in RUQ without rebound pain or  guarding. Bowel sounds active.  Musculoskeletal: no clubbing / cyanosis. No joint deformity upper and lower extremities.   Skin: no significant rashes, lesions, ulcers. Warm, dry, well-perfused. Neurologic: No facial asymmetry. Sensation intact. Moving all extremities.  Psychiatric: Alert and oriented to person, place, and situation. Pleasant and cooperative.     Labs on Admission: I have personally reviewed following labs and imaging studies  CBC: Recent Labs  Lab 08/10/17 1948 08/11/17 0327  WBC 13.3*  --   NEUTROABS  --  10.3*  HGB 10.5*  --   HCT 33.2*  --   MCV 84.9  --   PLT 839*  --    Basic Metabolic Panel: Recent Labs  Lab 08/10/17 1948  NA 134*  K 4.0  CL 99*  CO2 22  GLUCOSE 99  BUN 26*  CREATININE 2.00*  CALCIUM 8.9   GFR: Estimated Creatinine Clearance: 33.4 mL/min (A) (by C-G formula based on SCr of 2 mg/dL (H)). Liver Function Tests: Recent Labs  Lab 08/10/17 1948  AST 71*  ALT 32  ALKPHOS 260*  BILITOT 1.1  PROT 6.6  ALBUMIN 2.9*   Recent Labs  Lab 08/10/17 1948  LIPASE 22   No results for input(s): AMMONIA in the last 168 hours. Coagulation Profile: Recent Labs  Lab 08/11/17 0327  INR 1.15   Cardiac Enzymes: No results for input(s): CKTOTAL, CKMB, CKMBINDEX, TROPONINI in the last 168 hours. BNP (last 3 results) No results for input(s): PROBNP in the last 8760 hours. HbA1C: No results for input(s): HGBA1C in the last 72 hours. CBG: No results for input(s): GLUCAP in the last 168 hours. Lipid Profile: No results for input(s): CHOL, HDL, LDLCALC, TRIG, CHOLHDL, LDLDIRECT in the last 72 hours. Thyroid Function Tests: No results for input(s): TSH, T4TOTAL, FREET4, T3FREE, THYROIDAB in the last 72 hours. Anemia Panel: No results for input(s): VITAMINB12, FOLATE, FERRITIN, TIBC, IRON, RETICCTPCT in the last 72 hours. Urine analysis:    Component Value Date/Time   COLORURINE YELLOW 06/18/2016 1743   APPEARANCEUR CLEAR 06/18/2016  1743   LABSPEC 1.011 06/18/2016 1743   PHURINE 6.0 06/18/2016 1743   GLUCOSEU 50 (A) 06/18/2016 1743   HGBUR SMALL (A) 06/18/2016 1743   BILIRUBINUR NEGATIVE 06/18/2016 1743   KETONESUR NEGATIVE 06/18/2016 1743   PROTEINUR 100 (A) 06/18/2016 1743   UROBILINOGEN 2.0 (H) 03/04/2008 1751   NITRITE NEGATIVE 06/18/2016 1743   LEUKOCYTESUR NEGATIVE 06/18/2016 1743   Sepsis Labs: @LABRCNTIP (procalcitonin:4,lacticidven:4) )No results found for this or any previous visit (from the past 240 hour(s)).   Radiological Exams on Admission: Dg Chest 2 View  Result Date: 08/11/2017 CLINICAL DATA:  Smoking history. Hepatic lesions on CT earlier today. EXAM: CHEST - 2 VIEW COMPARISON:  No prior chest imaging. Lung bases from abdominal CT yesterday. FINDINGS: Heart size upper normal. Atherosclerosis of the aortic arch. Vague right infrahilar soft tissue prominence, nonspecific. No pulmonary edema. No dominant pulmonary mass or consolidation. No  pleural effusion or pneumothorax. IMPRESSION: Vague right infrahilar soft tissue fullness, likely overlapping vascular structures, without suspicious abnormality on included lung bases from abdominal CT. No dominant pulmonary mass or suspicious nodules visualized radiographically. Electronically Signed   By: Jeb Levering M.D.   On: 08/11/2017 03:45   Ct Abdomen Pelvis W Contrast  Addendum Date: 08/10/2017   ADDENDUM REPORT: 08/10/2017 23:02 ADDENDUM: Additionally, as stated in the findings, there is a left adrenal mass that has decreased in size compared to the prior study. Electronically Signed   By: Ulyses Jarred M.D.   On: 08/10/2017 23:02   Result Date: 08/10/2017 CLINICAL DATA:  Abdominal pain EXAM: CT ABDOMEN AND PELVIS WITH CONTRAST TECHNIQUE: Multidetector CT imaging of the abdomen and pelvis was performed using the standard protocol following bolus administration of intravenous contrast. CONTRAST:  11mL OMNIPAQUE IOHEXOL 300 MG/ML  SOLN COMPARISON:  CT  abdomen pelvis 03/04/2008 FINDINGS: LOWER CHEST: No basilar pulmonary nodules or pleural effusion. No apical pericardial effusion. HEPATOBILIARY: There are multiple (4-5) large hepatic masses, measuring up to approximately 9 x 9 cm. These are concentrated within the right hepatic lobe. The largest lesion is at the inferior margin of the liver. Normal gallbladder. PANCREAS: Normal parenchymal contours without ductal dilatation. No peripancreatic fluid collection. SPLEEN: Normal. ADRENALS/URINARY TRACT: --Adrenal glands: Heterogeneous left adrenal mass measures 3.8 x 3.5 cm, decreased in size from the prior examination. --Right kidney/ureter: Exophytic right renal cyst is increased in size to 6.5 cm. --Left kidney/ureter: No hydronephrosis, nephroureterolithiasis, perinephric stranding or solid renal mass. --Urinary bladder: Normal for degree of distention STOMACH/BOWEL: --Stomach/Duodenum: No hiatal hernia or other gastric abnormality. Normal duodenal course. --Small bowel: No dilatation or inflammation. --Colon: There is a short segment of transverse colon contained within a small periumbilical hernia. There is sigmoid diverticulosis without acute inflammation. --Appendix: Not visualized. No right lower quadrant inflammation or free fluid. VASCULAR/LYMPHATIC: Atherosclerotic calcification is present within the non-aneurysmal abdominal aorta, without hemodynamically significant stenosis. The portal vein, splenic vein, superior mesenteric vein and IVC are patent. No abdominal or pelvic lymphadenopathy. REPRODUCTIVE: No free fluid in the pelvis. MUSCULOSKELETAL. No bony spinal canal stenosis or focal osseous abnormality. OTHER: None. IMPRESSION: 1. Multiple large hepatic masses that are new compared to the prior studies. 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 These lesions could be better characterized with abdominal MRI with and without contrast. 2. Short segment  transverse colon contained within small periumbilical hernia without evidence of obstruction. 3.  Aortic Atherosclerosis (ICD10-I70.0). Electronically Signed: By: Ulyses Jarred M.D. On: 08/10/2017 22:48    EKG: Not performed.    Assessment/Plan   1. 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 Liver masses  - Multiple large liver masses noted on CT, most concerning for metastases from unknown primary  - Never had colonoscopy, denies change in bowel habits or FHx of GI malignancy   - CXR without definite nodule or mass  - Findings discussed with patient  - Check AFP; may need PET, CT chest, or tissue sample; inpt vs outpt  - Continue pain-control   3. Normocytic  anemia; thrombocytosis  - Hgb is 10.5 on admission, down from 11.6 a year earlier; no bleeding, likely secondary to malignancy  - Platelets 839k on admission, previously normal, likely secondary to malignancy   4. Hypertension  - BP at goal, hold lisinopril-HCTZ until renal function stabilizes    5. Seizure disorder  - Continue Keppra     DVT prophylaxis: Lovenox Code Status: Full  Family Communication: Discussed with patient Consults called: none Admission status: Inpatient    Vianne Bulls, MD [facility] Pager [phone]  If 7PM-7AM, please contact night-coverage [email or web address] Password [facility]  08/11/2017, 5:52 AM

## 2017-08-11 NOTE — ED Notes (Signed)
Pt's son at bedside. Pt lying on stretcher, alert, calm, cooperative.

## 2017-08-11 NOTE — ED Notes (Signed)
Ice chips given as requested. Daughter remains at bedside.

## 2017-08-11 NOTE — ED Notes (Signed)
Lying on stretcher - states is feeling better. Daughter remains at bedside. Monitor intact to pt.

## 2017-08-11 NOTE — ED Notes (Signed)
Daughter aware of pt's BP med being held reasoning.

## 2017-08-11 NOTE — ED Provider Notes (Signed)
[facility] EMERGENCY DEPARTMENT Provider Note   CSN: [id number] Arrival date & time: 08/10/17  1834     History   Chief Complaint Chief Complaint  Patient presents with  . Abdominal Pain    HPI Gabriel Romero is a 67 y.o. male.  Patient presents to the emergency department for evaluation of abdominal pain.  Patient reports right-sided abdominal pain that has been ongoing for several days.  Pain waxes and wanes but never goes away.  No associated nausea, vomiting, diarrhea or constipation.  He has not noticed any fever.     Past Medical History:  Diagnosis Date  . ETOH abuse   . Hypertension   . Seizures ([place])   . Stroke [facility])     Patient Active Problem List   Diagnosis Date Noted  . Acute renal failure superimposed on stage 2 chronic kidney disease ([place]) 08/11/2017  . Liver masses 08/11/2017  . Thrombocytosis ([place]) 08/11/2017  . Normocytic anemia 08/11/2017  . HTN (hypertension) 06/27/2016  . Seizure disorder ([place])   . Smoker   . History of intracranial hemorrhage 06/18/2016    History reviewed. No pertinent surgical history.      Home Medications    Prior to Admission medications   Medication Sig Start Date End Date Taking? Authorizing Provider  aspirin EC 81 MG tablet Take 1 tablet (81 mg total) by mouth daily. 08/26/16  Yes Garvin Fila, MD  atorvastatin (LIPITOR) 20 MG tablet Take 1 tablet (20 mg total) by mouth daily at 6 PM. 06/28/16  Yes Eugenie Filler, MD  levETIRAcetam (KEPPRA) 500 MG tablet Take 1 tablet (500 mg total) by mouth 2 (two) times daily. 10/24/16  Yes Dennie Bible, NP  lisinopril-hydrochlorothiazide (PRINZIDE,ZESTORETIC) 20-12.5 MG tablet Take 1 tablet by mouth daily.  04/02/17  Yes [provider]  pantoprazole (PROTONIX) 40 MG tablet Take 1 tablet (40 mg total) by mouth daily. 06/29/16  Yes Eugenie Filler, MD  folic acid (FOLVITE) 1 MG tablet Take 1 tablet (1 mg total) by mouth daily. Patient not  taking: Reported on 08/11/2017 06/29/16   Eugenie Filler, MD  multivitamin (PROSIGHT) TABS tablet Take 1 tablet by mouth daily. Patient not taking: Reported on 08/11/2017 06/29/16   Eugenie Filler, MD  thiamine 100 MG tablet Take 1 tablet (100 mg total) by mouth daily. Patient not taking: Reported on 08/11/2017 06/29/16   Eugenie Filler, MD    Family History Family History  Problem Relation Age of Onset  . Dementia Mother   . Hypertension Father     Social History Social History   Tobacco Use  . Smoking status: Current Some Day Smoker    Packs/day: 1.50    Types: Cigarettes  . Smokeless tobacco: Never Used  Substance Use Topics  . Alcohol use: Not Currently    Alcohol/week: 7.2 oz    Types: 12 Cans of beer per week    Comment: last quit march 2018  . Drug use: Yes    Frequency: 7.0 times per week    Types: Marijuana    Comment: every 3  months     Allergies   Sulfa antibiotics   Review of Systems Review of Systems  Gastrointestinal: Positive for abdominal pain.  All other systems reviewed and are negative.    Physical Exam Updated Vital Signs BP 125/75   Pulse 66   Temp 98 F (36.7 C) (Oral)   Resp 19   Ht '5\' 7"'$  (1.702  m)   Wt 65.8 kg (145 lb)   SpO2 94%   BMI 22.71 kg/m   Physical Exam  Constitutional: He is oriented to person, place, and time. He appears well-developed and well-nourished. No distress.  HENT:  Head: Normocephalic and atraumatic.  Right Ear: Hearing normal.  Left Ear: Hearing normal.  Nose: Nose normal.  Mouth/Throat: Oropharynx is clear and moist and mucous membranes are normal.  Eyes: Pupils are equal, round, and reactive to light. Conjunctivae and EOM are normal.  Neck: Normal range of motion. Neck supple.  Cardiovascular: Regular rhythm, S1 normal and S2 normal. Exam reveals no gallop and no friction rub.  No murmur heard. Pulmonary/Chest: Effort normal and breath sounds normal. No respiratory distress. He exhibits no  tenderness.  Abdominal: Soft. Normal appearance and bowel sounds are normal. He exhibits no mass. There is hepatomegaly. There is tenderness in the right upper quadrant. There is no rebound, no guarding, no tenderness at McBurney's point and negative Murphy's sign. No hernia.  Musculoskeletal: Normal range of motion.  Neurological: He is alert and oriented to person, place, and time. He has normal strength. No cranial nerve deficit or sensory deficit. Coordination normal. GCS eye subscore is 4. GCS verbal subscore is 5. GCS motor subscore is 6.  Skin: Skin is warm, dry and intact. No rash noted. No cyanosis.  Psychiatric: He has a normal mood and affect. His speech is normal and behavior is normal. Thought content normal.  Nursing note and vitals reviewed.    ED Treatments / Results  Labs (all labs ordered are listed, but only abnormal results are displayed) Labs Reviewed  COMPREHENSIVE METABOLIC PANEL - Abnormal; Notable for the following components:      Result Value   Sodium 134 (*)    Chloride 99 (*)    BUN 26 (*)    Creatinine, Ser 2.00 (*)    Albumin 2.9 (*)    AST 71 (*)    Alkaline Phosphatase 260 (*)    GFR calc non Af Amer 33 (*)    GFR calc Af Amer 38 (*)    All other components within normal limits  CBC - Abnormal; Notable for the following components:   WBC 13.3 (*)    RBC 3.91 (*)    Hemoglobin 10.5 (*)    HCT 33.2 (*)    Platelets 839 (*)    All other components within normal limits  DIFFERENTIAL - Abnormal; Notable for the following components:   Neutro Abs 10.3 (*)    All other components within normal limits  LIPASE, BLOOD  PROTIME-INR  URINALYSIS, ROUTINE W REFLEX MICROSCOPIC  AFP TUMOR MARKER  COMPREHENSIVE METABOLIC PANEL    EKG None  Radiology Dg Chest 2 View  Result Date: 08/11/2017 CLINICAL DATA:  Smoking history. Hepatic lesions on CT earlier today. EXAM: CHEST - 2 VIEW COMPARISON:  No prior chest imaging. Lung bases from abdominal CT  yesterday. FINDINGS: Heart size upper normal. Atherosclerosis of the aortic arch. Vague right infrahilar soft tissue prominence, nonspecific. No pulmonary edema. No dominant pulmonary mass or consolidation. No pleural effusion or pneumothorax. IMPRESSION: Vague right infrahilar soft tissue fullness, likely overlapping vascular structures, without suspicious abnormality on included lung bases from abdominal CT. No dominant pulmonary mass or suspicious nodules visualized radiographically. Electronically Signed   By: Jeb Levering M.D.   On: 08/11/2017 03:45   Ct Abdomen Pelvis W Contrast  Addendum Date: 08/10/2017   ADDENDUM REPORT: 08/10/2017 23:02 ADDENDUM: Additionally, as stated in the  findings, there is a left adrenal mass that has decreased in size compared to the prior study. Electronically Signed   By: Ulyses Jarred M.D.   On: 08/10/2017 23:02   Result Date: 08/10/2017 CLINICAL DATA:  Abdominal pain EXAM: CT ABDOMEN AND PELVIS WITH CONTRAST TECHNIQUE: Multidetector CT imaging of the abdomen and pelvis was performed using the standard protocol following bolus administration of intravenous contrast. CONTRAST:  169m OMNIPAQUE IOHEXOL 300 MG/ML  SOLN COMPARISON:  CT abdomen pelvis 03/04/2008 FINDINGS: LOWER CHEST: No basilar pulmonary nodules or pleural effusion. No apical pericardial effusion. HEPATOBILIARY: There are multiple (4-5) large hepatic masses, measuring up to approximately 9 x 9 cm. These are concentrated within the right hepatic lobe. The largest lesion is at the inferior margin of the liver. Normal gallbladder. PANCREAS: Normal parenchymal contours without ductal dilatation. No peripancreatic fluid collection. SPLEEN: Normal. ADRENALS/URINARY TRACT: --Adrenal glands: Heterogeneous left adrenal mass measures 3.8 x 3.5 cm, decreased in size from the prior examination. --Right kidney/ureter: Exophytic right renal cyst is increased in size to 6.5 cm. --Left kidney/ureter: No hydronephrosis,  nephroureterolithiasis, perinephric stranding or solid renal mass. --Urinary bladder: Normal for degree of distention STOMACH/BOWEL: --Stomach/Duodenum: No hiatal hernia or other gastric abnormality. Normal duodenal course. --Small bowel: No dilatation or inflammation. --Colon: There is a short segment of transverse colon contained within a small periumbilical hernia. There is sigmoid diverticulosis without acute inflammation. --Appendix: Not visualized. No right lower quadrant inflammation or free fluid. VASCULAR/LYMPHATIC: Atherosclerotic calcification is present within the non-aneurysmal abdominal aorta, without hemodynamically significant stenosis. The portal vein, splenic vein, superior mesenteric vein and IVC are patent. No abdominal or pelvic lymphadenopathy. REPRODUCTIVE: No free fluid in the pelvis. MUSCULOSKELETAL. No bony spinal canal stenosis or focal osseous abnormality. OTHER: None. IMPRESSION: 1. Multiple large hepatic masses that are new compared to the prior studies. 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 These lesions could be better characterized with abdominal MRI with and without contrast. 2. Short segment transverse colon contained within small periumbilical hernia without evidence of obstruction. 3.  Aortic Atherosclerosis (ICD10-I70.0). Electronically Signed: By: KUlyses JarredM.D. On: 08/10/2017 22:48    Procedures Procedures (including critical care time)  Medications Ordered in ED Medications  sodium chloride 0.9 % bolus 500 mL (500 mLs Intravenous New Bag/Given 08/11/17 0442)  nicotine (NICODERM CQ - dosed in mg/24 hours) patch 21 mg (21 mg Transdermal Patch Applied 08/11/17 0449)  oxyCODONE-acetaminophen (PERCOCET/ROXICET) 5-325 MG per tablet 1 tablet (1 tablet Oral Given 08/10/17 1951)  iohexol (OMNIPAQUE) 300 MG/ML solution 80 mL (100 mLs Intravenous Contrast Given 08/10/17 2212)  HYDROmorphone (DILAUDID) injection 0.5 mg (0.5  mg Intravenous Given 08/11/17 0445)  ondansetron (ZOFRAN) injection 4 mg (4 mg Intravenous Given 08/11/17 0444)     Initial Impression / Assessment and Plan / ED Course  I have reviewed the triage vital signs and the nursing notes.  Pertinent labs & imaging results that were available during my care of the patient were reviewed by me and considered in my medical decision making (see chart for details).     Patient presents to the ER for evaluation of right-sided abdominal pain.  Patient has been having moderate to severe, constant pain in the right side of his abdomen for several days.  Examination revealed hepatomegaly.  He has diffuse right-sided tenderness with guarding.  Lab work was mostly unremarkable, other than elevated alk phos and AST.  Patient is, however, found to have an acute kidney injury  with BUN of 26 and creatinine of 2.00.  He was given some gentle hydration here in the ER with analgesia.  CT scan concerning for metastatic disease in his liver.  Chest x-ray performed, no obvious masses in the chest.  Source of the metastasis is unclear at this time.  Patient will be hospitalized for pain control, monitoring of his AKI and initiation of work-up for metastatic cancer.  Final Clinical Impressions(s) / ED Diagnoses   Final diagnoses:  Liver mass  AKI (acute kidney injury) [facility])    ED Discharge Orders    None       Orpah Greek, MD 08/11/17 [id number]

## 2017-08-11 NOTE — ED Notes (Signed)
Pt being transported to [id number] via stretcher.

## 2017-08-11 NOTE — ED Notes (Signed)
Daughter at bedside. Dr Rama advised pt and daughter via phone - CT negative and waiting on blood work results and biopsy. Daughter requesting RN to call dr to request BP meds and Nicotine Patch.

## 2017-08-11 NOTE — ED Notes (Signed)
Got patient up to the bathroom patient is now back in bed call bell in reach and family at bedside

## 2017-08-11 NOTE — ED Notes (Signed)
Per daughter, pt has been confused since CVA.

## 2017-08-12 ENCOUNTER — Encounter (HOSPITAL_COMMUNITY): Payer: Self-pay | Admitting: General Practice

## 2017-08-12 ENCOUNTER — Inpatient Hospital Stay (HOSPITAL_COMMUNITY): Payer: Medicare HMO

## 2017-08-12 LAB — PSA, TOTAL AND FREE
PSA FREE PCT: 30 %
PSA, Free: 0.15 ng/mL
Prostate Specific Ag, Serum: 0.5 ng/mL (ref 0.0–4.0)

## 2017-08-12 LAB — BASIC METABOLIC PANEL
ANION GAP: 12 (ref 5–15)
BUN: 23 mg/dL — ABNORMAL HIGH (ref 6–20)
CALCIUM: 8.6 mg/dL — AB (ref 8.9–10.3)
CHLORIDE: 103 mmol/L (ref 101–111)
CO2: 21 mmol/L — AB (ref 22–32)
Creatinine, Ser: 2.04 mg/dL — ABNORMAL HIGH (ref 0.61–1.24)
GFR calc Af Amer: 37 mL/min — ABNORMAL LOW (ref >60)
GFR calc non Af Amer: 32 mL/min — ABNORMAL LOW (ref >60)
GLUCOSE: 86 mg/dL (ref 65–99)
Potassium: 4.4 mmol/L (ref 3.5–5.1)
Sodium: 136 mmol/L (ref 135–145)

## 2017-08-12 LAB — CEA: CEA: 2.4 ng/mL (ref 0.0–4.7)

## 2017-08-12 LAB — AMMONIA: AMMONIA: 36 umol/L — AB (ref 9–35)

## 2017-08-12 MED ORDER — SODIUM CHLORIDE 0.9 % IV SOLN
INTRAVENOUS | Status: AC | PRN
  Administered 2017-08-12: 10 mL/h via INTRAVENOUS

## 2017-08-12 MED ORDER — MORPHINE SULFATE (PF) 4 MG/ML IV SOLN
4.0000 mg | INTRAVENOUS | Status: DC | PRN
  Administered 2017-08-12 – 2017-08-15 (×7): 4 mg via INTRAVENOUS
  Filled 2017-08-12 (×7): qty 1

## 2017-08-12 MED ORDER — LIDOCAINE HCL (PF) 1 % IJ SOLN
INTRAMUSCULAR | Status: AC
  Administered 2017-08-12: 11:00:00
  Filled 2017-08-12: qty 30

## 2017-08-12 MED ORDER — MIDAZOLAM HCL 2 MG/2ML IJ SOLN
INTRAMUSCULAR | Status: AC
  Administered 2017-08-12: 11:00:00
  Filled 2017-08-12: qty 2

## 2017-08-12 MED ORDER — FENTANYL CITRATE (PF) 100 MCG/2ML IJ SOLN
INTRAMUSCULAR | Status: AC | PRN
  Administered 2017-08-12: 25 ug via INTRAVENOUS

## 2017-08-12 MED ORDER — MIDAZOLAM HCL 2 MG/2ML IJ SOLN
INTRAMUSCULAR | Status: AC | PRN
  Administered 2017-08-12: 1 mg via INTRAVENOUS

## 2017-08-12 MED ORDER — ENSURE ENLIVE PO LIQD
237.0000 mL | Freq: Three times a day (TID) | ORAL | Status: DC
  Administered 2017-08-12 – 2017-08-24 (×33): 237 mL via ORAL

## 2017-08-12 MED ORDER — GELATIN ABSORBABLE 12-7 MM EX MISC
CUTANEOUS | Status: AC
  Administered 2017-08-12: 12:00:00
  Filled 2017-08-12: qty 1

## 2017-08-12 MED ORDER — FENTANYL CITRATE (PF) 100 MCG/2ML IJ SOLN
INTRAMUSCULAR | Status: AC
  Administered 2017-08-12: 11:00:00
  Filled 2017-08-12: qty 2

## 2017-08-12 NOTE — Procedures (Signed)
R lobe liver mass Bx 18 g times three EBL 0 Comp 0 

## 2017-08-12 NOTE — Progress Notes (Signed)
Patient c/o not being able to urinate. 232ml in bag with condom catheter. Bladder scan showed 159ml. Will continue to monitor.

## 2017-08-12 NOTE — Progress Notes (Signed)
Progress Note    Gabriel Romero  RKY:[id number] DOB: [birth year]  DOA: 08/10/2017 PCP: [facility]    Brief Narrative:   Chief complaint: F/U RUQ pain  Medical records reviewed and are as summarized below:  Gabriel Romero is an 67 y.o. 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  CT of the abdomen and pelvis showed multiple large liver masses consistent with metastatic disease from an unknown primary.  Also found to have AKI.  Assessment/Plan:   Principal Problem:   Liver metastasis ([place]) associated with right upper quadrant pain CT abdomen showed liver masses concerning for metastasis.  Malignancy work-up in progress. CT of the chest did not show any evidence of lung cancer.  Follow-up AFP.  CEA/PSA WNL.  AFP pending.  Underwent CT guided liver biopsy today.  Continue pain control with morphine and oxycodone as needed. I have adjusted morphine to Q 1 hour PRN given inadequate pain control over night.  Has required parenteral IV pain management due to severity of RUQ pain.  Active Problems:   Seizure disorder ([place]) Continue Keppra.    HTN (hypertension) Lisinopril/HCTZ on hold given AKI. Creatinine continues to be elevated. Baseline creatinine appears to be 1.2.    Acute renal failure superimposed on stage 2 chronic kidney disease (HCC) Baseline creatinine appears to be around 1.2.  Admitting creatinine 2.0 with mild improvement overnight.  Continue to hold lisinopril/HCTZ and provide gentle hydration.  Follow-up creatinine ordered for this morning.    Thrombocytosis (HCC) Possibly reactive. Monitor.    Normocytic anemia Likely AOCD.    Weight loss/underweight Body mass index is 25.86 kg/m. Dietician consult. 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   Family Communication/Anticipated D/C date and  plan/Code Status   DVT prophylaxis: Heparin ordered. Code Status: Full Code.  Family Communication: Daughter at bedside. Disposition Plan: Unclear, depends on work up findings. Continues to require inpatient care due to intensity of service for pain management needs.   Medical Consultants:    IR   Anti-Infectives:    None  Subjective:   The patient is sedated post biopsy. He reports inadequate pain control over night. No nausea or vomiting.  Objective:    Vitals:   08/12/17 1120 08/12/17 1200 08/12/17 1300 08/12/17 1401  BP: 131/67 (!) 151/63 131/66   Pulse: 69 80 70   Resp: 14 16    Temp:      TempSrc:      SpO2: 96% 94% 93%   Weight:    74.9 kg (165 lb 2 oz)  Height:        Intake/Output Summary (Last 24 hours) at 08/12/2017 1411 Last data filed at 08/12/2017 1300 Gross per 24 hour  Intake 600 ml  Output 1500 ml  Net -900 ml   Filed Weights   08/11/17 1845 08/12/17 0519 08/12/17 1401  Weight: 57.1 kg (125 lb 14.1 oz) 57.1 kg (125 lb 14.1 oz) 74.9 kg (165 lb 2 oz)    Exam: General: No acute distress. Sedated Cardiovascular: Heart sounds show a regular rate, and rhythm. No gallops or rubs. No murmurs. No JVD. Lungs: Clear to auscultation bilaterally with good air movement. No rales, rhonchi or wheezes. Abdomen: Soft, RUQ tenderness, nondistended with normal active bowel sounds.  Neurological: Somnolent. Moves all extremities 4 with equal strength. Cranial nerves II through XII grossly intact. Skin: Warm  and dry. No rashes or lesions. Extremities: No clubbing or cyanosis. No edema. Pedal pulses 2+. Psychiatric: Mood and affect are normal. Insight and judgment are impaired.    Data Reviewed:   I have personally reviewed following labs and imaging studies:  Labs: Labs show the following:   Basic Metabolic Panel: Recent Labs  Lab 08/10/17 1948 08/11/17 0607 08/12/17 1201  NA 134* 135 136  K 4.0 4.1 4.4  CL 99* 101 103  CO2 22 21* 21*  GLUCOSE  99 85 86  BUN 26* 27* 23*  CREATININE 2.00* 1.92* 2.04*  CALCIUM 8.9 8.5* 8.6*   GFR Estimated Creatinine Clearance: 32.9 mL/min (A) (by C-G formula based on SCr of 2.04 mg/dL (H)). Liver Function Tests: Recent Labs  Lab 08/10/17 1948 08/11/17 0607  AST 71* 57*  ALT 32 26  ALKPHOS 260* 225*  BILITOT 1.1 0.9  PROT 6.6 5.9*  ALBUMIN 2.9* 2.6*   Recent Labs  Lab 08/10/17 1948  LIPASE 22    Coagulation profile Recent Labs  Lab 08/11/17 0327  INR 1.15    CBC: Recent Labs  Lab 08/10/17 1948 08/11/17 0327  WBC 13.3*  --   NEUTROABS  --  10.3*  HGB 10.5*  --   HCT 33.2*  --   MCV 84.9  --   PLT 839*  --     Microbiology No results found for this or any previous visit (from the past 240 hour(s)).  Procedures and diagnostic studies:  Dg Chest 2 View  Result Date: 08/11/2017 CLINICAL DATA:  Smoking history. Hepatic lesions on CT earlier today. EXAM: CHEST - 2 VIEW COMPARISON:  No prior chest imaging. Lung bases from abdominal CT yesterday. FINDINGS: Heart size upper normal. Atherosclerosis of the aortic arch. Vague right infrahilar soft tissue prominence, nonspecific. No pulmonary edema. No dominant pulmonary mass or consolidation. No pleural effusion or pneumothorax. IMPRESSION: Vague right infrahilar soft tissue fullness, likely overlapping vascular structures, without suspicious abnormality on included lung bases from abdominal CT. No dominant pulmonary mass or suspicious nodules visualized radiographically. Electronically Signed   By: Jeb Levering M.D.   On: 08/11/2017 03:45   Ct Chest Wo Contrast  Result Date: 08/11/2017 CLINICAL DATA:  Metastatic disease. EXAM: CT CHEST WITHOUT CONTRAST TECHNIQUE: Multidetector CT imaging of the chest was performed following the standard protocol without IV contrast. COMPARISON:  CT scan of Aug 10, 2017. FINDINGS: Cardiovascular: Atherosclerosis of thoracic aorta is noted without aneurysm formation. Extensive coronary artery  calcifications are noted. Normal cardiac size. No pericardial effusion. Mediastinum/Nodes: No enlarged mediastinal or axillary lymph nodes. Thyroid gland, trachea, and esophagus demonstrate no significant findings. Lungs/Pleura: No pneumothorax or pleural effusion is noted. Mild bilateral posterior basilar subsegmental atelectasis is noted. Upper Abdomen: Large low density lesions are noted in the visualized liver consistent with metastatic disease. Left adrenal mass is again noted. Musculoskeletal: No chest wall mass or suspicious bone lesions identified. IMPRESSION: Extensive coronary artery calcifications are noted suggesting coronary artery disease. Mild bilateral posterior basilar subsegmental atelectasis is noted. Hepatic lesions are again noted consistent with metastatic disease. Left adrenal mass is noted. Aortic Atherosclerosis (ICD10-I70.0). Electronically Signed   By: Marijo Conception, M.D.   On: 08/11/2017 12:09   Ct Abdomen Pelvis W Contrast  Addendum Date: 08/10/2017   ADDENDUM REPORT: 08/10/2017 23:02 ADDENDUM: Additionally, as stated in the findings, there is a left adrenal mass that has decreased in size compared to the prior study. Electronically Signed   By: Cletus Gash.D.  On: 08/10/2017 23:02   Result Date: 08/10/2017 CLINICAL DATA:  Abdominal pain EXAM: CT ABDOMEN AND PELVIS WITH CONTRAST TECHNIQUE: Multidetector CT imaging of the abdomen and pelvis was performed using the standard protocol following bolus administration of intravenous contrast. CONTRAST:  124mL OMNIPAQUE IOHEXOL 300 MG/ML  SOLN COMPARISON:  CT abdomen pelvis 03/04/2008 FINDINGS: LOWER CHEST: No basilar pulmonary nodules or pleural effusion. No apical pericardial effusion. HEPATOBILIARY: There are multiple (4-5) large hepatic masses, measuring up to approximately 9 x 9 cm. These are concentrated within the right hepatic lobe. The largest lesion is at the inferior margin of the liver. Normal gallbladder. PANCREAS:  Normal parenchymal contours without ductal dilatation. No peripancreatic fluid collection. SPLEEN: Normal. ADRENALS/URINARY TRACT: --Adrenal glands: Heterogeneous left adrenal mass measures 3.8 x 3.5 cm, decreased in size from the prior examination. --Right kidney/ureter: Exophytic right renal cyst is increased in size to 6.5 cm. --Left kidney/ureter: No hydronephrosis, nephroureterolithiasis, perinephric stranding or solid renal mass. --Urinary bladder: Normal for degree of distention STOMACH/BOWEL: --Stomach/Duodenum: No hiatal hernia or other gastric abnormality. Normal duodenal course. --Small bowel: No dilatation or inflammation. --Colon: There is a short segment of transverse colon contained within a small periumbilical hernia. There is sigmoid diverticulosis without acute inflammation. --Appendix: Not visualized. No right lower quadrant inflammation or free fluid. VASCULAR/LYMPHATIC: Atherosclerotic calcification is present within the non-aneurysmal abdominal aorta, without hemodynamically significant stenosis. The portal vein, splenic vein, superior mesenteric vein and IVC are patent. No abdominal or pelvic lymphadenopathy. REPRODUCTIVE: No free fluid in the pelvis. MUSCULOSKELETAL. No bony spinal canal stenosis or focal osseous abnormality. OTHER: None. IMPRESSION: 1. Multiple large hepatic masses that are new compared to the prior studies. 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 These lesions could be better characterized with abdominal MRI with and without contrast. 2. Short segment transverse colon contained within small periumbilical hernia without evidence of obstruction. 3.  Aortic Atherosclerosis (ICD10-I70.0). Electronically Signed: By: Ulyses Jarred M.D. On: 08/10/2017 22:48   US Biopsy (liver)  Result Date: 08/12/2017 INDICATION: Liver mass EXAM: ULTRASOUND-GUIDED BIOPSY RIGHT LOBE LIVER MASS.  CORE. MEDICATIONS: None. ANESTHESIA/SEDATION:  Fentanyl 25 mcg IV; Versed 1 mg IV Moderate Sedation Time:  10 minutes The patient was continuously monitored during the procedure by the interventional radiology nurse under my direct supervision. FLUOROSCOPY TIME:  Fluoroscopy Time:  minutes  seconds ( mGy). COMPLICATIONS: None immediate. PROCEDURE: Informed written consent was obtained from the patient after a thorough discussion of the procedural risks, benefits and alternatives. All questions were addressed. Maximal Sterile Barrier Technique was utilized including caps, mask, sterile gowns, sterile gloves, sterile drape, hand hygiene and skin antiseptic. A timeout was performed prior to the initiation of the procedure. The right upper quadrant was prepped with ChloraPrep in a sterile fashion, and a sterile drape was applied covering the operative field. A sterile gown and sterile gloves were used for the procedure. Under sonographic guidance, an 17 gauge guide needle was advanced into the right lobe liver lesion. Subsequently 3 18 gauge core biopsies were obtained. Gel-Foam slurry was injected into the needle tract. The guide needle was removed. Final imaging was performed. Patient tolerated the procedure well without complication. Vital sign monitoring by nursing staff during the procedure will continue as patient is in the special procedures unit for post procedure observation. FINDINGS: The images document guide needle placement within the right lobe liver lesion. Post biopsy images demonstrate no hemorrhage. IMPRESSION: Successful ultrasound-guided core biopsy of a right lobe liver  lesion. Electronically Signed   By: Marybelle Killings M.D.   On: 08/12/2017 11:53    Medications:   . feeding supplement (ENSURE ENLIVE)  237 mL Oral TID BM  . heparin  5,000 Units Subcutaneous Q8H  . levETIRAcetam  500 mg Oral BID  . nicotine  14 mg Transdermal Once   Continuous Infusions:    LOS: 1 day   Jacquelynn Cree  [facility] Pager [phone]. If  unable to reach me by pager, please call my cell phone at [phone].  *Please refer to [email or web address], password [email or web address] to get updated schedule on who will round on this patient, as hospitalists switch teams weekly. If 7PM-7AM, please contact night-coverage at [email or web address], password [email or web address] for any overnight needs.  08/12/2017, 2:11 PM

## 2017-08-12 NOTE — Progress Notes (Signed)
Initial Nutrition Assessment  DOCUMENTATION CODES:   Not applicable  INTERVENTION:  Ensure Enlive po TID, each supplement provides 350 kcal and 20 grams of protein  Magic cup TID with meals, each supplement provides 290 kcal and 9 grams of protein  NUTRITION DIAGNOSIS:   Increased nutrient needs related to chronic illness as evidenced by estimated needs.  GOAL:   Patient will meet greater than or equal to 90% of their needs  MONITOR:   PO intake, Supplement acceptance, I & O's, Labs  REASON FOR ASSESSMENT:   Consult Assessment of nutrition requirement/status  ASSESSMENT:   Gabriel Romero is a 67 y.o. male with past medical history of alcohol abuse, HTN, CVA who presented to [facility] ED with severe abdominal pain. CT of abdomen found liver lesions most consistent with metastatic disease, biopsy scheduled, AKI on CKD II  Spoke with patient, patient's ex-wife, and his daughter at bedside. PO intake history is unclear. Patient does not eat breakfast. Normally will eat fast food, or a lunchable, or "something that you can get from the grocery store," for lunch. Dinner is similar, sometimes patient's daughter brings meals, or son will cook for him.  They report he has been eating less recently, getting full quickly.   Ex-wife reports an estimated 20 pound weight loss over 3 months.  His weight was listed at 125 pounds upon admit, this RD obtained bed scale weight of 165 pounds. Previous weight of 186 pounds obtained during ED visit on 04/28/2017 indicates a 21 pound/12% severe weight loss for time frame. Previous weights recorded in chart are close to this ED weight.  Ordered supplements for patient, monitor PO intake. Does not meet criteria for malnutrition at this time but certainly is at risk.  Labs reviewed:  BUN/Creatinine 23/2.04 Medications reviewed   NUTRITION - FOCUSED PHYSICAL EXAM:    Most Recent Value  Orbital Region  Mild depletion  Upper Arm Region  Mild depletion   Thoracic and Lumbar Region  No depletion  Buccal Region  No depletion  Temple Region  Mild depletion  Clavicle Bone Region  Mild depletion  Clavicle and Acromion Bone Region  No depletion  Scapular Bone Region  No depletion  Dorsal Hand  Moderate depletion  Patellar Region  Moderate depletion  Anterior Thigh Region  Moderate depletion  Posterior Calf Region  No depletion       Diet Order:   Diet Order           Diet regular Room service appropriate? Yes; Fluid consistency: Thin  Diet effective now          EDUCATION NEEDS:   Not appropriate for education at this time  Skin:  Skin Assessment: Reviewed RN Assessment  Last BM:  08/10/2017  Height:   Ht Readings from Last 1 Encounters:  08/11/17 5\' 7"  (1.702 m)    Weight:   Wt Readings from Last 1 Encounters:  08/12/17 165 lb 2 oz (74.9 kg)    Ideal Body Weight:  67.27 kg  BMI:  Body mass index is 25.86 kg/m.  Estimated Nutritional Needs:   Kcal:  1700-2000 calories (30-35 cal/kg)  Protein:  85-97 grams (1.5-1.7g/kg)  Fluid:  1.7-2.0L  Satira Anis. Arnold Kester, MS, RD LDN Inpatient Clinical Dietitian Pager [phone]

## 2017-08-13 LAB — AFP TUMOR MARKER: AFP, SERUM, TUMOR MARKER: 13761 ng/mL — AB (ref 0.0–8.3)

## 2017-08-13 MED ORDER — OXYCODONE HCL 5 MG PO TABS
10.0000 mg | ORAL_TABLET | ORAL | Status: DC | PRN
  Administered 2017-08-13 – 2017-08-15 (×3): 10 mg via ORAL
  Filled 2017-08-13 (×3): qty 2

## 2017-08-13 NOTE — Consult Note (Signed)
[facility] CM Primary Care Navigator  08/13/2017  Gabriel Romero 07-02-1950 [id number]   Met withpatient and daughter Gabriel Romero) at the bedside to identify possibledischarge needs.  Patient reports having"increased pain to right lower abdomen" that had led to this admission. Per MD note, CT of the abdomen and pelvis showed multiple large liver masses consistent with metastatic disease from an unknown primary.  [name] endorses (Dr. Lennette Bihari Via) a new primary care provider with [place] at [facility] (just changed recently) as the primary care provider. Patient is no longer seen at [facility] per daughter.   Patient Development worker, community pharmacy on [address] obtain medications without difficulty.  Patient's son Gabriel Romero, Gabriel Romero.) has beenmanaging his medications at homestraight out of the containers.  [name] has been providingtransportation to patient's doctors' appointments.  [name] states that his brother (works) lives with patient and serves as a the primary caregiver at home and she (works and has 2 children to take care of) will be providing assistance with his needs when available.  Anticipated discharge plan is still to be determineddepending on work- up findings per MD note.   Patient and daughter voiced understanding to call primary care provider's office when he returns back home for a post discharge follow-up appointment within a week or sooner if needs arise.Patient letter (with PCP's contact number) wasprovided as theirreminder.  Explained to patient and daughter regarding[facility] CM services available for healthmanagementat homebut daughter denies any needs or issues for now.  Discharge disposition is still unsure but patient's daughter is aware and expressedunderstanding to [facility]ref[facility] care managementfrom primary care provider ifdeemed necessary  andappropriatefor services in the future.  [facility] care management information provided for any future needs that may arise.  Primary care provider's office is listed as providing transition of care (TOC) follow-up.   For additional questions please contact:  Edwena Felty A. Alnita Aybar, BSN, RN-BC [facility] PRIMARY CARE Navigator Cell: [phone]

## 2017-08-13 NOTE — Progress Notes (Signed)
Progress Note    Gabriel Romero  ION:[id number] DOB: 13-Apr-1950  DOA: 08/10/2017 PCP: [facility]    Brief Narrative:   Chief complaint: F/U RUQ pain  Medical records reviewed and are as summarized below:  Gabriel Romero is an 67 y.o. 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  CT of the abdomen and pelvis showed multiple large liver masses consistent with metastatic disease from an unknown primary.  Also found to have AKI.  Assessment/Plan:   Principal Problem:   Liver metastasis ([place]) associated with right upper quadrant pain CT abdomen showed liver masses concerning for metastasis.  Malignancy work-up in progress. CT of the chest did not show any evidence of lung cancer.  CEA/PSA WNL.  AFP pending.  Underwent CT guided liver biopsy 08/14/17.  Continue pain control with morphine and oxycodone as needed, still requiring parenteral narcotics for pain control.  Pathology pending.  Active Problems:   Tobacco Abuse Nicotine patch ordered.    Seizure disorder ([place]) Continue Keppra.    HTN (hypertension) Lisinopril/HCTZ on hold given AKI. Creatinine continues to be elevated. Baseline creatinine appears to be 1.2.    Acute renal failure superimposed on stage 2 chronic kidney disease (HCC) Baseline creatinine appears to be around 1.2.  Admitting creatinine 2.0, current 2.4.  Continue to hold lisinopril/HCTZ and provide gentle hydration.      Thrombocytosis (HCC) Possibly reactive. Monitor.    Normocytic anemia Likely AOCD.    Weight loss/underweight Body mass index is 26.52 kg/m. Dietician consulted, continue Ensure. 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   Family Communication/Anticipated D/C date and plan/Code Status   DVT prophylaxis: Heparin ordered. Code Status: Full Code.  Family  Communication: Daughter at bedside. Disposition Plan: Unclear, depends on work up findings. Continues to require inpatient care due to intensity of service for pain management needs.   Medical Consultants:    IR   Anti-Infectives:    None  Subjective:   The patient continues to require Q 2 hour pain meds. No BMs. Appetite fair, drinking ensure. No nausea or vomiting.  Objective:    Vitals:   08/12/17 1401 08/12/17 2148 08/13/17 0553 08/13/17 0825  BP:  (!) 146/90 130/69 (!) 145/81  Pulse:  88 77 86  Resp:  16 12 14   Temp:  99.9 F (37.7 C) 98.9 F (37.2 C) 98.4 F (36.9 C)  TempSrc:  Oral Oral Oral  SpO2:  93% 96% 92%  Weight: 74.9 kg (165 lb 2 oz)  77.3 kg (170 lb 6.7 oz) 76.8 kg (169 lb 5 oz)  Height:        Intake/Output Summary (Last 24 hours) at 08/13/2017 0840 Last data filed at 08/13/2017 0557 Gross per 24 hour  Intake 1160 ml  Output 1500 ml  Net -340 ml   Filed Weights   08/12/17 1401 08/13/17 0553 08/13/17 0825  Weight: 74.9 kg (165 lb 2 oz) 77.3 kg (170 lb 6.7 oz) 76.8 kg (169 lb 5 oz)    Exam: General: No acute distress. Cardiovascular: Heart sounds show a regular rate, and rhythm. No gallops or rubs. No murmurs. No JVD. Lungs: Clear to auscultation bilaterally with good air movement. No rales, rhonchi or wheezes. Abdomen: RUQ tenderness, + bowel sounds. Neurological: More alert today. Moves all extremities 4 with equal strength. Cranial nerves II through XII grossly intact. Skin:  Warm and dry. No rashes or lesions. Extremities: No clubbing or cyanosis. No edema. Pedal pulses 2+. Psychiatric: Mood and affect are normal. Insight and judgment are fair.  Data Reviewed:   I have personally reviewed following labs and imaging studies:  Labs: Labs show the following:   Basic Metabolic Panel: Recent Labs  Lab 08/10/17 1948 08/11/17 0607 08/12/17 1201  NA 134* 135 136  K 4.0 4.1 4.4  CL 99* 101 103  CO2 22 21* 21*  GLUCOSE 99 85 86  BUN  26* 27* 23*  CREATININE 2.00* 1.92* 2.04*  CALCIUM 8.9 8.5* 8.6*   GFR Estimated Creatinine Clearance: 32.9 mL/min (A) (by C-G formula based on SCr of 2.04 mg/dL (H)). Liver Function Tests: Recent Labs  Lab 08/10/17 1948 08/11/17 0607  AST 71* 57*  ALT 32 26  ALKPHOS 260* 225*  BILITOT 1.1 0.9  PROT 6.6 5.9*  ALBUMIN 2.9* 2.6*   Recent Labs  Lab 08/10/17 1948  LIPASE 22    Coagulation profile Recent Labs  Lab 08/11/17 0327  INR 1.15    CBC: Recent Labs  Lab 08/10/17 1948 08/11/17 0327  WBC 13.3*  --   NEUTROABS  --  10.3*  HGB 10.5*  --   HCT 33.2*  --   MCV 84.9  --   PLT 839*  --     Microbiology No results found for this or any previous visit (from the past 240 hour(s)).  Procedures and diagnostic studies:  Ct Chest Wo Contrast  Result Date: 08/11/2017 CLINICAL DATA:  Metastatic disease. EXAM: CT CHEST WITHOUT CONTRAST TECHNIQUE: Multidetector CT imaging of the chest was performed following the standard protocol without IV contrast. COMPARISON:  CT scan of Aug 10, 2017. FINDINGS: Cardiovascular: Atherosclerosis of thoracic aorta is noted without aneurysm formation. Extensive coronary artery calcifications are noted. Normal cardiac size. No pericardial effusion. Mediastinum/Nodes: No enlarged mediastinal or axillary lymph nodes. Thyroid gland, trachea, and esophagus demonstrate no significant findings. Lungs/Pleura: No pneumothorax or pleural effusion is noted. Mild bilateral posterior basilar subsegmental atelectasis is noted. Upper Abdomen: Large low density lesions are noted in the visualized liver consistent with metastatic disease. Left adrenal mass is again noted. Musculoskeletal: No chest wall mass or suspicious bone lesions identified. IMPRESSION: Extensive coronary artery calcifications are noted suggesting coronary artery disease. Mild bilateral posterior basilar subsegmental atelectasis is noted. Hepatic lesions are again noted consistent with  metastatic disease. Left adrenal mass is noted. Aortic Atherosclerosis (ICD10-I70.0). Electronically Signed   By: Marijo Conception, M.D.   On: 08/11/2017 12:09   US Biopsy (liver)  Result Date: 08/12/2017 INDICATION: Liver mass EXAM: ULTRASOUND-GUIDED BIOPSY RIGHT LOBE LIVER MASS.  CORE. MEDICATIONS: None. ANESTHESIA/SEDATION: Fentanyl 25 mcg IV; Versed 1 mg IV Moderate Sedation Time:  10 minutes The patient was continuously monitored during the procedure by the interventional radiology nurse under my direct supervision. FLUOROSCOPY TIME:  Fluoroscopy Time:  minutes  seconds ( mGy). COMPLICATIONS: None immediate. PROCEDURE: Informed written consent was obtained from the patient after a thorough discussion of the procedural risks, benefits and alternatives. All questions were addressed. Maximal Sterile Barrier Technique was utilized including caps, mask, sterile gowns, sterile gloves, sterile drape, hand hygiene and skin antiseptic. A timeout was performed prior to the initiation of the procedure. The right upper quadrant was prepped with ChloraPrep in a sterile fashion, and a sterile drape was applied covering the operative field. A sterile gown and sterile gloves were used for the procedure. Under sonographic guidance, an 17 gauge  guide needle was advanced into the right lobe liver lesion. Subsequently 3 18 gauge core biopsies were obtained. Gel-Foam slurry was injected into the needle tract. The guide needle was removed. Final imaging was performed. Patient tolerated the procedure well without complication. Vital sign monitoring by nursing staff during the procedure will continue as patient is in the special procedures unit for post procedure observation. FINDINGS: The images document guide needle placement within the right lobe liver lesion. Post biopsy images demonstrate no hemorrhage. IMPRESSION: Successful ultrasound-guided core biopsy of a right lobe liver lesion. Electronically Signed   By: Marybelle Killings  M.D.   On: 08/12/2017 11:53    Medications:   . feeding supplement (ENSURE ENLIVE)  237 mL Oral TID BM  . heparin  5,000 Units Subcutaneous Q8H  . levETIRAcetam  500 mg Oral BID   Continuous Infusions:    LOS: 2 days   Jacquelynn Cree  [facility] Pager [phone]. If unable to reach me by pager, please call my cell phone at [phone].  *Please refer to [email or web address], password [email or web address] to get updated schedule on who will round on this patient, as hospitalists switch teams weekly. If 7PM-7AM, please contact night-coverage at [email or web address], password [email or web address] for any overnight needs.  08/13/2017, 8:40 AM

## 2017-08-14 DIAGNOSIS — Z79899 Other long term (current) drug therapy: Secondary | ICD-10-CM

## 2017-08-14 DIAGNOSIS — G9340 Encephalopathy, unspecified: Secondary | ICD-10-CM

## 2017-08-14 DIAGNOSIS — E871 Hypo-osmolality and hyponatremia: Secondary | ICD-10-CM | POA: Diagnosis present

## 2017-08-14 DIAGNOSIS — C220 Liver cell carcinoma: Principal | ICD-10-CM

## 2017-08-14 DIAGNOSIS — Z8673 Personal history of transient ischemic attack (TIA), and cerebral infarction without residual deficits: Secondary | ICD-10-CM

## 2017-08-14 DIAGNOSIS — I129 Hypertensive chronic kidney disease with stage 1 through stage 4 chronic kidney disease, or unspecified chronic kidney disease: Secondary | ICD-10-CM

## 2017-08-14 LAB — COMPREHENSIVE METABOLIC PANEL
ALK PHOS: 225 U/L — AB (ref 38–126)
ALT: 26 U/L (ref 17–63)
AST: 62 U/L — ABNORMAL HIGH (ref 15–41)
Albumin: 2.4 g/dL — ABNORMAL LOW (ref 3.5–5.0)
Anion gap: 10 (ref 5–15)
BUN: 18 mg/dL (ref 6–20)
CO2: 26 mmol/L (ref 22–32)
Calcium: 8.8 mg/dL — ABNORMAL LOW (ref 8.9–10.3)
Chloride: 96 mmol/L — ABNORMAL LOW (ref 101–111)
Creatinine, Ser: 1.63 mg/dL — ABNORMAL HIGH (ref 0.61–1.24)
GFR, EST AFRICAN AMERICAN: 49 mL/min — AB (ref >60)
GFR, EST NON AFRICAN AMERICAN: 42 mL/min — AB (ref >60)
Glucose, Bld: 128 mg/dL — ABNORMAL HIGH (ref 65–99)
Potassium: 4.2 mmol/L (ref 3.5–5.1)
Sodium: 132 mmol/L — ABNORMAL LOW (ref 135–145)
Total Bilirubin: 0.5 mg/dL (ref 0.3–1.2)
Total Protein: 5.8 g/dL — ABNORMAL LOW (ref 6.5–8.1)

## 2017-08-14 LAB — CBC
HEMATOCRIT: 30.9 % — AB (ref 39.0–52.0)
HEMOGLOBIN: 9.5 g/dL — AB (ref 13.0–17.0)
MCH: 26.3 pg (ref 26.0–34.0)
MCHC: 30.7 g/dL (ref 30.0–36.0)
MCV: 85.6 fL (ref 78.0–100.0)
Platelets: 662 10*3/uL — ABNORMAL HIGH (ref 150–400)
RBC: 3.61 MIL/uL — ABNORMAL LOW (ref 4.22–5.81)
RDW: 13.8 % (ref 11.5–15.5)
WBC: 11.3 10*3/uL — ABNORMAL HIGH (ref 4.0–10.5)

## 2017-08-14 LAB — HEPATITIS C ANTIBODY: HCV Ab: 0.2 s/co ratio (ref 0.0–0.9)

## 2017-08-14 MED ORDER — SODIUM CHLORIDE 0.9 % IV SOLN
INTRAVENOUS | Status: DC
  Administered 2017-08-14 – 2017-08-16 (×5): via INTRAVENOUS
  Administered 2017-08-17: 100 mL via INTRAVENOUS
  Administered 2017-08-17 – 2017-08-21 (×5): via INTRAVENOUS

## 2017-08-14 MED ORDER — LACTULOSE 10 GM/15ML PO SOLN
20.0000 g | Freq: Every day | ORAL | Status: DC
  Administered 2017-08-14 – 2017-08-15 (×2): 20 g via ORAL
  Filled 2017-08-14 (×2): qty 30

## 2017-08-14 MED ORDER — DIAZEPAM 5 MG PO TABS
5.0000 mg | ORAL_TABLET | Freq: Two times a day (BID) | ORAL | Status: DC | PRN
  Administered 2017-08-14 – 2017-08-15 (×3): 5 mg via ORAL
  Filled 2017-08-14 (×3): qty 1

## 2017-08-14 MED ORDER — OXYCODONE HCL ER 10 MG PO T12A
20.0000 mg | EXTENDED_RELEASE_TABLET | Freq: Two times a day (BID) | ORAL | Status: DC
  Administered 2017-08-14 – 2017-08-24 (×20): 20 mg via ORAL
  Filled 2017-08-14 (×20): qty 2

## 2017-08-14 NOTE — Consult Note (Signed)
Referral MD  Reason for Referral: Hepatocellular carcinoma in the setting of history of alcoholic cirrhosis  Chief Complaint  Patient presents with  . Abdominal Pain  : Patient really cannot give history due to altered mental state  HPI: [name] is a 67 year old white male.  He had a stroke last year.  This is hemorrhagic.  He had severe hypertension.  He really had no family doctor.  He did have seizures.  He is seen with a coworker and a friend.  He apparently was admitted on 21 May.  He was having increasing right upper quadrant abdominal pain.  He stopped drinking about a year or so ago.  It is hard to know how much he has drunk in the past.  When he was admitted, he had lab work done.  He had some mild renal insufficiency.  He had mild leukocytosis.  He had marked thrombocytosis.  A CT of the abdomen showed multiple large liver masses.  This appeared to be consistent with hepatocellular carcinoma.  His alpha-fetoprotein was over 13,000.  On the CT scan, it seemed as if the tumors were in the right side of his liver.  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  He has had no problems with bleeding.  He has had no history of hepatitis B or hepatitis C.  He is HIV negative.  His pneumonia is 36.  His albumin is 2.4.  I do not think there is any ascites on the CT scan. Again, his alpha-fetoprotein was 13,761.   I think he did have a biopsy done.This is done on 22 May.  The pathology report shows that the tumor appears to be consistent with hepatocellular carcinoma.  It looks like he has been in pretty decent shape.  I am not sure he is lost any weight.  He has had no obvious fever.  He has had no obvious bleeding.  There is been no pain outside that associate with the right upper quadrant.  Overall, I would say that his performance status is ECOG 1.    Past Medical History:  Diagnosis Date  . CKD (chronic kidney disease), stage II   . ETOH abuse   . Hypertension    . Liver metastasis ([place]) 07/2017  . Seizures ([place])   . Stroke [facility])   :  Past Surgical History:  Procedure Laterality Date  . CERVICAL DISC SURGERY    :   Current Facility-Administered Medications:  .  0.9 %  sodium chloride infusion, , Intravenous, Continuous, Regalado, Belkys A, MD, Last Rate: 75 mL/hr at 08/14/17 0825 .  acetaminophen (TYLENOL) tablet 650 mg, 650 mg, Oral, Q6H PRN **OR** acetaminophen (TYLENOL) suppository 650 mg, 650 mg, Rectal, Q6H PRN, Opyd, Timothy S, MD .  diazepam (VALIUM) tablet 5 mg, 5 mg, Oral, BID PRN, Rama, Venetia Maxon, MD, 5 mg at 08/14/17 1239 .  feeding supplement (ENSURE ENLIVE) (ENSURE ENLIVE) liquid 237 mL, 237 mL, Oral, TID BM, Rama, Christina P, MD, 237 mL at 08/14/17 1425 .  heparin injection 5,000 Units, 5,000 Units, Subcutaneous, Q8H, Opyd, Ilene Qua, MD, 5,000 Units at 08/14/17 1425 .  lactulose (CHRONULAC) 10 GM/15ML solution 20 g, 20 g, Oral, Daily, Rama, Venetia Maxon, MD, 20 g at 08/14/17 1239 .  levETIRAcetam (KEPPRA) tablet 500 mg, 500 mg, Oral, BID, Opyd, Ilene Qua, MD, 500 mg at 08/14/17 0825 .  morphine 4 MG/ML injection 4 mg, 4 mg, Intravenous, Q1H PRN, Rama, Venetia Maxon, MD, 4 mg at 08/14/17 0641 .  ondansetron (ZOFRAN) tablet 4 mg, 4 mg, Oral, Q6H PRN **OR** ondansetron (ZOFRAN) injection 4 mg, 4 mg, Intravenous, Q6H PRN, Opyd, Ilene Qua, MD, 4 mg at 08/11/17 1644 .  oxyCODONE (Oxy IR/ROXICODONE) immediate release tablet 10 mg, 10 mg, Oral, Q4H PRN, Rama, Venetia Maxon, MD, 10 mg at 08/14/17 0420 .  oxyCODONE (OXYCONTIN) 12 hr tablet 20 mg, 20 mg, Oral, Q12H, Rama, Venetia Maxon, MD, 20 mg at 08/14/17 1239 .  senna-docusate (Senokot-S) tablet 1 tablet, 1 tablet, Oral, QHS PRN, Opyd, Ilene Qua, MD:  . feeding supplement (ENSURE ENLIVE)  237 mL Oral TID BM  . heparin  5,000 Units Subcutaneous Q8H  . lactulose  20 g Oral Daily  . levETIRAcetam  500 mg Oral BID  . oxyCODONE  20 mg Oral Q12H  :  Allergies  Allergen Reactions  . Sulfa  Antibiotics Rash  :  Family History  Problem Relation Age of Onset  . Dementia Mother   . Hypertension Father   . Colon cancer Neg Hx   :  Social History   Socioeconomic History  . Marital status: Single    Spouse name: Not on file  . Number of children: 2  . Years of education: Not on file  . Highest education level: Not on file  Occupational History  . Occupation: Bread delivery    Comment: [employer]  Social Needs  . Financial resource strain: Not on file  . Food insecurity:    Worry: Not on file    Inability: Not on file  . Transportation needs:    Medical: Not on file    Non-medical: Not on file  Tobacco Use  . Smoking status: Current Some Day Smoker    Packs/day: 1.50    Types: Cigarettes  . Smokeless tobacco: Never Used  Substance and Sexual Activity  . Alcohol use: Not Currently    Alcohol/week: 7.2 oz    Types: 12 Cans of beer per week    Comment: last quit march 2018  . Drug use: Yes    Frequency: 7.0 times per week    Types: Marijuana    Comment: every 3  months  . Sexual activity: Not on file  Lifestyle  . Physical activity:    Days per week: Not on file    Minutes per session: Not on file  . Stress: Not on file  Relationships  . Social connections:    Talks on phone: Not on file    Gets together: Not on file    Attends religious service: Not on file    Active member of club or organization: Not on file    Attends meetings of clubs or organizations: Not on file    Relationship status: Not on file  . Intimate partner violence:    Fear of current or ex partner: Not on file    Emotionally abused: Not on file    Physically abused: Not on file    Forced sexual activity: Not on file  Other Topics Concern  . Not on file  Social History Narrative   Lives with son  :  Pertinent items are noted in HPI.  Exam: Patient Vitals for the past 24 hrs:  BP Temp Temp src Pulse Resp SpO2 Weight  08/14/17 1557 (!) 156/100 98.2 F (36.8 C) Oral 87  17 92 % -  08/14/17 0532 125/70 - - 74 16 94 % -  08/14/17 0531 - 99.3 F (37.4 C) Oral - - - 169 lb 8.5  oz (76.9 kg)  08/13/17 2037 135/71 99.3 F (37.4 C) Oral 79 16 90 % -     Recent Labs    08/14/17 0530  WBC 11.3*  HGB 9.5*  HCT 30.9*  PLT 662*   Recent Labs    08/12/17 1201 08/14/17 0530  NA 136 132*  K 4.4 4.2  CL 103 96*  CO2 21* 26  GLUCOSE 86 128*  BUN 23* 18  CREATININE 2.04* 1.63*  CALCIUM 8.6* 8.8*    Blood smear review: None  Pathology: See above    Assessment and Plan: [name] is a 67 year old white male.  It appears that he has hepatocellular carcinoma.  It looks like that his Child's-Pugh class is A.  Actually, despite the encephalopathy, I think he is in pretty decent shape.  I do not believe that the mental status issues are from his tumor.  I think it is  going to be necessary to do an MRI of his liver to see exactly the involvement of his liver.  I do not see any obvious disease outside of his liver.  If his disease is confined to one lobe of his liver, then I would consider him for a surgical resection.  I think this would not be unreasonable given his physical status of being pretty good.  Again, his mental status is a little bit suspect because of the CVA and seizure that he had and not from hepatic cirrhosis.  His son was present for part of the evaluation.  I talked him a little bit.  I am not sure if Mr. Buelna will be able to cooperate for the MRI.  If he is not a candidate for surgery, then I think he would be candidate for upfront chemotherapy with Nexavar.  This is the recommended frontline therapy for hepatocellular carcinoma.  I think this would be reasonable for Mr. Fearnow.  I spent a good hour with Mr. Chaffin and his friends/son.  I do not think Mr. Lindenberger really understood what I was saying.  We will follow along and help out in any way that we can.  Lattie Haw, MD  Michaelyn Barter 2:10

## 2017-08-14 NOTE — Care Management Important Message (Signed)
Important Message  Patient Details  Name: Gabriel Romero MRN: [id number] Date of Birth: 12/08/1950   Medicare Important Message Given:  Yes    Barb Merino Vale Mousseau 08/14/2017, 3:32 PM

## 2017-08-14 NOTE — NC FL2 (Signed)
[place] LEVEL OF CARE SCREENING TOOL     IDENTIFICATION  Patient Name: Gabriel Romero Birthdate: 1950-12-10 Sex: male Admission Date (Current Location): 08/10/2017  [facility] and [place] Number:  Herbalist and Address:  [facility] [place], [place] [address] [place], [place] [address]      Provider Number: [id number]  Attending Physician Name and Address:  Elmarie Shiley, MD  Relative Name and Phone Number:  [name], daughter, [phone]    Current Level of Care: Hospital Recommended Level of Care: [place] Prior Approval Number:    Date Approved/Denied:   PASRR Number: [id number]  Discharge Plan: SNF    Current Diagnoses: Patient Active Problem List   Diagnosis Date Noted  . Hyponatremia 08/14/2017  . Acute renal failure superimposed on stage 2 chronic kidney disease ([place]) 08/11/2017  . Liver masses 08/11/2017  . Thrombocytosis ([place]) 08/11/2017  . Normocytic anemia 08/11/2017  . AKI (acute kidney injury) ([place]) 08/11/2017  . Liver metastasis ([place]) associated with right upper quadrant pain 08/11/2017  . HTN (hypertension) 06/27/2016  . Seizure disorder ([place])   . Tobacco abuse   . History of intracranial hemorrhage 06/18/2016    Orientation RESPIRATION BLADDER Height & Weight     Self, Place  Normal Incontinent, External catheter Weight: 76.9 kg (169 lb 8.5 oz) Height:  5\' 7"  (170.2 cm)  BEHAVIORAL SYMPTOMS/MOOD NEUROLOGICAL BOWEL NUTRITION STATUS      Continent Diet(Please see DC Summary)  AMBULATORY STATUS COMMUNICATION OF NEEDS Skin   Limited Assist Verbally Surgical wounds(Closed incision on abdomen)                       Personal Care Assistance Level of Assistance  Bathing, Feeding, Dressing Bathing Assistance: Maximum assistance Feeding assistance: Limited assistance Dressing Assistance: Limited assistance     Functional Limitations Info  Sight, Hearing, Speech Sight Info:  Adequate Hearing Info: Adequate Speech Info: Adequate    SPECIAL CARE FACTORS FREQUENCY  PT (By licensed PT), OT (By licensed OT)     PT Frequency: 5x/week OT Frequency: 3x/week            Contractures      Additional Factors Info  Code Status, Allergies Code Status Info: Full Allergies Info: Sulfa Antibiotics           Current Medications (08/14/2017):  This is the current hospital active medication list Current Facility-Administered Medications  Medication Dose Route Frequency Provider Last Rate Last Dose  . 0.9 %  sodium chloride infusion   Intravenous Continuous Regalado, Belkys A, MD 75 mL/hr at 08/14/17 0825    . acetaminophen (TYLENOL) tablet 650 mg  650 mg Oral Q6H PRN Opyd, Ilene Qua, MD       Or  . acetaminophen (TYLENOL) suppository 650 mg  650 mg Rectal Q6H PRN Opyd, Ilene Qua, MD      . diazepam (VALIUM) tablet 5 mg  5 mg Oral BID PRN Rama, Venetia Maxon, MD   5 mg at 08/14/17 1239  . feeding supplement (ENSURE ENLIVE) (ENSURE ENLIVE) liquid 237 mL  237 mL Oral TID BM Rama, Venetia Maxon, MD   237 mL at 08/14/17 0825  . heparin injection 5,000 Units  5,000 Units Subcutaneous Q8H Opyd, Ilene Qua, MD   5,000 Units at 08/14/17 0631  . lactulose (CHRONULAC) 10 GM/15ML solution 20 g  20 g Oral Daily Rama, Venetia Maxon, MD   20 g at 08/14/17 1239  .  levETIRAcetam (KEPPRA) tablet 500 mg  500 mg Oral BID Opyd, Ilene Qua, MD   500 mg at 08/14/17 0825  . morphine 4 MG/ML injection 4 mg  4 mg Intravenous Q1H PRN Rama, Venetia Maxon, MD   4 mg at 08/14/17 0641  . ondansetron (ZOFRAN) tablet 4 mg  4 mg Oral Q6H PRN Opyd, Ilene Qua, MD       Or  . ondansetron (ZOFRAN) injection 4 mg  4 mg Intravenous Q6H PRN Opyd, Ilene Qua, MD   4 mg at 08/11/17 1644  . oxyCODONE (Oxy IR/ROXICODONE) immediate release tablet 10 mg  10 mg Oral Q4H PRN Rama, Venetia Maxon, MD   10 mg at 08/14/17 0420  . oxyCODONE (OXYCONTIN) 12 hr tablet 20 mg  20 mg Oral Q12H Rama, Venetia Maxon, MD   20 mg at 08/14/17  1239  . senna-docusate (Senokot-S) tablet 1 tablet  1 tablet Oral QHS PRN Opyd, Ilene Qua, MD         Discharge Medications: Please see discharge summary for a list of discharge medications.  Relevant Imaging Results:  Relevant Lab Results:   Additional Information SS#: [id number]  Benard Halsted, LCSWA

## 2017-08-14 NOTE — Evaluation (Signed)
Physical Therapy Evaluation Patient Details Name: Gabriel Romero MRN: [id number] DOB: Oct 09, 1950 Today's Date: 08/14/2017   History of Present Illness  Gabriel Romero is an 67 y.o. 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  CT of the abdomen and pelvis showed multiple large liver masses consistent with metastatic disease from an unknown primary.  Also found to have AKI. Found to be primary liver cancer. No metastases noted on CT scan.    Clinical Impression  Pt admitted with above diagnosis. Pt currently with functional limitations due to the deficits listed below (see PT Problem List). Patient requires moderate assist for bed mobility, transfers and ambulation 20 feet with IV pole and 1 hand held assist. Will attempt LRAD, possibly RW. Did not use AD PTA. Patient has difficulty sequencing steps, following directions and short term memory. Patient perseverates on urination despite condom cath. Pt will benefit from skilled PT to increase their independence and safety with mobility to allow discharge to the venue listed below.       Follow Up Recommendations SNF;Supervision/Assistance - 24 hour    Equipment Recommendations  None recommended by PT(at this time.)    Recommendations for Other Services OT consult     Precautions / Restrictions Precautions Precautions: Fall Restrictions Weight Bearing Restrictions: No      Mobility  Bed Mobility Overal bed mobility: Needs Assistance Bed Mobility: Supine to Sit;Sit to Supine     Supine to sit: Mod assist;HOB elevated Sit to supine: Min assist;HOB elevated      Transfers Overall transfer level: Needs assistance Equipment used: 1 person hand held assist Transfers: Sit to/from [facility] Sit to Stand: Mod assist Stand pivot transfers: Mod assist        General transfer comment: assistance to initiate movement  Ambulation/Gait Ambulation/Gait assistance: Mod assist Ambulation Distance (Feet): 20 Feet Assistive device: 1 person hand held assist;IV Pole Gait Pattern/deviations: Shuffle;Step-through pattern;Decreased step length - right;Decreased step length - left;Decreased stride length;Leaning posteriorly Gait velocity: significantly decreased   General Gait Details: maintained a firm grip on IV pole and therapist; fearful of falling.  Stairs            Wheelchair Mobility    Modified Rankin (Stroke Patients Only)       Balance Overall balance assessment: Needs assistance Sitting-balance support: Bilateral upper extremity supported;Feet supported Sitting balance-Leahy Scale: Fair     Standing balance support: Bilateral upper extremity supported;During functional activity Standing balance-Leahy Scale: Poor                               Pertinent Vitals/Pain Pain Assessment: 0-10 Pain Score: 8  Pain Location: Rt abdomen Pain Intervention(s): Monitored during session;Limited activity within patient's tolerance    Home Living Family/patient expects to be discharged to:: Private residence Living Arrangements: Children Available Help at Discharge: Family;Available PRN/intermittently Type of Home: House Home Access: Stairs to enter Entrance Stairs-Rails: None Entrance Stairs-Number of Steps: 3 Home Layout: Two level;1/2 bath on main level;Bed/bath upstairs Home Equipment: None      Prior Function Level of Independence: Independent               Hand Dominance        Extremity/Trunk Assessment   Upper Extremity Assessment Upper Extremity Assessment: Generalized weakness    Lower  Extremity Assessment Lower Extremity Assessment: Generalized weakness    Cervical / Trunk Assessment Cervical / Trunk Assessment: Normal  Communication   Communication: No difficulties  Cognition  Arousal/Alertness: Awake/alert Behavior During Therapy: WFL for tasks assessed/performed Overall Cognitive Status: Impaired/Different from baseline Area of Impairment: Problem solving;Memory;Following commands                     Memory: Decreased short-term memory Following Commands: Follows one step commands inconsistently;Follows one step commands with increased time     Problem Solving: Slow processing;Difficulty sequencing;Requires verbal cues;Requires tactile cues;Decreased initiation        General Comments      Exercises     Assessment/Plan    PT Assessment Patient needs continued PT services  PT Problem List Decreased strength;Decreased mobility;Decreased activity tolerance;Decreased cognition;Decreased balance;Pain;Decreased knowledge of use of DME       PT Treatment Interventions DME instruction;Therapeutic activities;Gait training;Therapeutic exercise;Patient/family education;Functional mobility training    PT Goals (Current goals can be found in the Care Plan section)  Acute Rehab PT Goals Patient Stated Goal: not sure PT Goal Formulation: With patient/family Time For Goal Achievement: 08/28/17 Potential to Achieve Goals: Fair    Frequency Min 2X/week   Barriers to discharge Inaccessible home environment;Decreased caregiver support daughter is primary caregiver for 2 children; son works during the day; 3 stairs with no handrail to enter home; 15 stairs to get to bedroom/full bath    Co-evaluation               AM-PAC PT "6 Clicks" Daily Activity  Outcome Measure Difficulty turning over in bed (including adjusting bedclothes, sheets and blankets)?: A Lot Difficulty moving from lying on back to sitting on the side of the bed? : A Lot Difficulty sitting down on and standing up from a chair with arms (e.g., wheelchair, bedside commode, etc,.)?: A Lot Help needed moving to and from a bed to chair (including a wheelchair)?: A Lot Help needed  walking in hospital room?: A Lot Help needed climbing 3-5 steps with a railing? : Total 6 Click Score: 11    End of Session Equipment Utilized During Treatment: Gait belt Activity Tolerance: Patient tolerated treatment well;Patient limited by fatigue Patient left: in bed;with bed alarm set;with family/visitor present Nurse Communication: Mobility status PT Visit Diagnosis: Unsteadiness on feet (R26.81);History of falling (Z91.81);Other abnormalities of gait and mobility (R26.89);Difficulty in walking, not elsewhere classified (R26.2)    Time: 0930-1000 PT Time Calculation (min) (ACUTE ONLY): 30 min   Charges:   PT Evaluation $PT Eval Moderate Complexity: 1 Mod PT Treatments $Therapeutic Activity: 8-22 mins   PT G Codes:        Terry Bolotin D. Hartnett-Rands, MS, PT Per [place] [phone] 08/14/2017, 10:25 AM

## 2017-08-14 NOTE — Progress Notes (Signed)
Pt expressed concern that he needed to urinate but felt that he couldn't. Bladder scan showed 48 ml in bladder. Pt states he feels better now. Will continue to monitor.

## 2017-08-14 NOTE — Clinical Social Work Note (Signed)
Clinical Social Work Assessment  Patient Details  Name: Gabriel Romero MRN: [id number] Date of Birth: 1950-05-27  Date of referral:  08/14/17               Reason for consult:  Facility Placement                Permission sought to share information with:  Facility Sport and exercise psychologist, Family Supports Permission granted to share information::  No  Name::     Armed forces logistics/support/administrative officer::  SNFs  Relationship::  Daughter  Contact Information:  [phone]  Housing/Transportation Living arrangements for the past 2 months:  Single Family Home Source of Information:  Adult Children Patient Interpreter Needed:  None Criminal Activity/Legal Involvement Pertinent to Current Situation/Hospitalization:  No - Comment as needed Significant Relationships:  Adult Children Lives with:  Adult Children Do you feel safe going back to the place where you live?  No Need for family participation in patient care:  Yes (Comment)  Care giving concerns:  CSW received consult for possible SNF placement at time of discharge. CSW spoke with patient's daughter [name] regarding PT recommendation of SNF placement at time of discharge. [name] reported that patient live with patient's son but he is currently unable to care for patient at their home given patient's current physical needs and fall risk. [name] expressed understanding of PT recommendation and is agreeable to SNF placement at time of discharge. CSW to continue to follow and assist with discharge planning needs.   Social Worker assessment / plan:  CSW spoke with patient's daughter concerning possibility of rehab at [facility] before returning home.  Employment status:  Retired Nurse, adult PT Recommendations:  [place] / Referral to community resources:  [place]  Patient/Family's Response to care:  [name] recognizes need for rehab before returning home and is  agreeable to a SNF in [place] or [place]. CSW explained that patient will need insurance authorization, which can take up to five days and is closed over the weekend. CSW will investigate if any SNF can accept Letter of Guarantee. [place] does not have beds until next week.   Patient/Family's Understanding of and Emotional Response to Diagnosis, Current Treatment, and Prognosis:  Patient/family is realistic regarding therapy needs and expressed being hopeful for SNF placement. [name] expressed understanding of CSW role and discharge process as well as medical condition. No questions/concerns about plan or treatment.    Emotional Assessment Appearance:  Appears stated age Attitude/Demeanor/Rapport:  Unable to Assess Affect (typically observed):  Unable to Assess Orientation:  Oriented to Self, Oriented to Place Alcohol / Substance use:  Not Applicable Psych involvement (Current and /or in the community):  No (Comment)  Discharge Needs  Concerns to be addressed:  Care Coordination Readmission within the last 30 days:  No Current discharge risk:  Dependent with Mobility Barriers to Discharge:  Continued Medical Work up   [employer], [place] 08/14/2017, 5:53 PM

## 2017-08-14 NOTE — Progress Notes (Signed)
Progress Note    Gabriel Romero  LNL:[id number] DOB: 06/23/1950  DOA: 08/10/2017 PCP: [facility]    Brief Narrative:   Chief complaint: F/U RUQ pain  Medical records reviewed and are as summarized below:  Gabriel Romero is an 67 y.o. 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  CT of the abdomen and pelvis showed multiple large liver masses consistent with metastatic disease from an unknown primary.  Also found to have AKI.  Assessment/Plan:   Principal Problem:   Liver metastasis ([place]) associated with right upper quadrant pain CT abdomen showed liver masses concerning for metastasis.  Malignancy work-up in progress. CT of the chest did not show any evidence of lung cancer.  CEA/PSA WNL.  AFP markedly elevated at 13,746.0.  Underwent CT guided liver biopsy 08/13/17, pathology consistent with [place].  Continue pain control with morphine and oxycodone as needed, still requiring parenteral narcotics for pain control and am actively titrating oral medications to achieve pain control in anticipation of d/c home.  I have added Oxycontin 20 mg Q 12 hours. Add Lactulose daily as a bowel regimen, given concern for hyperammonemia developing. Given that this is likely a primary [place], discussed case with Dr. Marin Olp of oncology who will consult.   Active Problems:   Tobacco Abuse Nicotine patch ordered.    Seizure disorder ([place]) Continue Keppra.    HTN (hypertension) Lisinopril/HCTZ on hold given AKI. Creatinine continues to be elevated. Baseline creatinine appears to be 1.2, currently 1.63.    Acute renal failure superimposed on stage 2 chronic kidney disease (HCC) Baseline creatinine appears to be around 1.2.  Admitting creatinine 2.0, currently 1.63.  Continue to hold lisinopril/HCTZ and provide gentle  hydration.      Thrombocytosis (HCC) Possibly reactive. Improving. 839--->662.    Normocytic anemia Likely AOCD. Hemoglobin stable post bx.    Weight loss/underweight Body mass index is 26.55 kg/m. Evaluated by dietician, continue Ensure. 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    Hyponatremia Mild, monitor.   Family Communication/Anticipated D/C date and plan/Code Status   DVT prophylaxis: Heparin ordered. Code Status: Full Code.  Family Communication: Daughter at bedside. Disposition Plan: Likely 08/15/17, awaiting oncology consult and still titrating oral pain meds to achieve pain control. Continues to require inpatient care due to intensity of service for pain management needs.   Medical Consultants:    IR   Anti-Infectives:    None  Subjective:   The patient continues to require Q 3 hour IV pain meds. Tearful when given the news regarding + biopsy. No nausea or vomiting.  Objective:    Vitals:   08/13/17 1202 08/13/17 2037 08/14/17 0531 08/14/17 0532  BP: 108/61 135/71  125/70  Pulse: 71 79  74  Resp: 12 16  16   Temp: 98.8 F (37.1 C) 99.3 F (37.4 C) 99.3 F (37.4 C)   TempSrc: Oral Oral Oral   SpO2: 94% 90%  94%  Weight:   76.9 kg (169 lb 8.5 oz)   Height:        Intake/Output Summary (Last 24 hours) at 08/14/2017 0855 Last data filed at 08/14/2017 4081 Gross per 24 hour  Intake 240 ml  Output 1650 ml  Net -1410 ml   Filed Weights   08/13/17 0553 08/13/17 0825 08/14/17 0531  Weight: 77.3 kg (170 lb 6.7 oz) 76.8 kg (  169 lb 5 oz) 76.9 kg (169 lb 8.5 oz)    Exam: General: Chronically ill appearing man. Cardiovascular: Heart sounds show a regular rate, and rhythm. No gallops or rubs. No murmurs. No JVD. Lungs: Clear to auscultation bilaterally with good air movement. No rales, rhonchi or wheezes. Abdomen: Tender RUQ, + bowel sounds. Neurological: Alert and oriented 2.  Moves all extremities 4 with equal strength. Cranial nerves II through XII  grossly intact. Skin: Warm and dry. No rashes or lesions. Extremities: No clubbing or cyanosis. No edema. Pedal pulses 2+. Psychiatric: Mood and affect are depressed/flat. Insight and judgment are fair.   Data Reviewed:   I have personally reviewed following labs and imaging studies:  Labs: Labs show the following:   Basic Metabolic Panel: Recent Labs  Lab 08/10/17 1948 08/11/17 0607 08/12/17 1201 08/14/17 0530  NA 134* 135 136 132*  K 4.0 4.1 4.4 4.2  CL 99* 101 103 96*  CO2 22 21* 21* 26  GLUCOSE 99 85 86 128*  BUN 26* 27* 23* 18  CREATININE 2.00* 1.92* 2.04* 1.63*  CALCIUM 8.9 8.5* 8.6* 8.8*   GFR Estimated Creatinine Clearance: 41.1 mL/min (A) (by C-G formula based on SCr of 1.63 mg/dL (H)). Liver Function Tests: Recent Labs  Lab 08/10/17 1948 08/11/17 0607 08/14/17 0530  AST 71* 57* 62*  ALT 32 26 26  ALKPHOS 260* 225* 225*  BILITOT 1.1 0.9 0.5  PROT 6.6 5.9* 5.8*  ALBUMIN 2.9* 2.6* 2.4*   Recent Labs  Lab 08/10/17 1948  LIPASE 22    Coagulation profile Recent Labs  Lab 08/11/17 0327  INR 1.15    CBC: Recent Labs  Lab 08/10/17 1948 08/11/17 0327 08/14/17 0530  WBC 13.3*  --  11.3*  NEUTROABS  --  10.3*  --   HGB 10.5*  --  9.5*  HCT 33.2*  --  30.9*  MCV 84.9  --  85.6  PLT 839*  --  662*    Microbiology No results found for this or any previous visit (from the past 240 hour(s)).  Procedures and diagnostic studies:  US Biopsy (liver)  Result Date: 08/12/2017 INDICATION: Liver mass EXAM: ULTRASOUND-GUIDED BIOPSY RIGHT LOBE LIVER MASS.  CORE. MEDICATIONS: None. ANESTHESIA/SEDATION: Fentanyl 25 mcg IV; Versed 1 mg IV Moderate Sedation Time:  10 minutes The patient was continuously monitored during the procedure by the interventional radiology nurse under my direct supervision. FLUOROSCOPY TIME:  Fluoroscopy Time:  minutes  seconds ( mGy). COMPLICATIONS: None immediate. PROCEDURE: Informed written consent was obtained from the patient  after a thorough discussion of the procedural risks, benefits and alternatives. All questions were addressed. Maximal Sterile Barrier Technique was utilized including caps, mask, sterile gowns, sterile gloves, sterile drape, hand hygiene and skin antiseptic. A timeout was performed prior to the initiation of the procedure. The right upper quadrant was prepped with ChloraPrep in a sterile fashion, and a sterile drape was applied covering the operative field. A sterile gown and sterile gloves were used for the procedure. Under sonographic guidance, an 17 gauge guide needle was advanced into the right lobe liver lesion. Subsequently 3 18 gauge core biopsies were obtained. Gel-Foam slurry was injected into the needle tract. The guide needle was removed. Final imaging was performed. Patient tolerated the procedure well without complication. Vital sign monitoring by nursing staff during the procedure will continue as patient is in the special procedures unit for post procedure observation. FINDINGS: The images document guide needle placement within the right lobe liver  lesion. Post biopsy images demonstrate no hemorrhage. IMPRESSION: Successful ultrasound-guided core biopsy of a right lobe liver lesion. Electronically Signed   By: Marybelle Killings M.D.   On: 08/12/2017 11:53    Medications:   . feeding supplement (ENSURE ENLIVE)  237 mL Oral TID BM  . heparin  5,000 Units Subcutaneous Q8H  . levETIRAcetam  500 mg Oral BID   Continuous Infusions: . sodium chloride 75 mL/hr at 08/14/17 0825     LOS: 3 days   35 minutes spent with > 50% of the time in face to face contact with the patient and his daughter, discussing the pathology results, diagnosis, treatment plan and "next steps".  Margreta Journey Rama  [facility] Pager [phone]. If unable to reach me by pager, please call my cell phone at [phone].  *Please refer to [email or web address], password [email or web address] to get updated schedule on who will round on this  patient, as hospitalists switch teams weekly. If 7PM-7AM, please contact night-coverage at [email or web address], password [email or web address] for any overnight needs.  08/14/2017, 8:55 AM

## 2017-08-15 ENCOUNTER — Inpatient Hospital Stay (HOSPITAL_COMMUNITY): Payer: Medicare HMO

## 2017-08-15 LAB — CBC
HCT: 32.4 % — ABNORMAL LOW (ref 39.0–52.0)
Hemoglobin: 10.3 g/dL — ABNORMAL LOW (ref 13.0–17.0)
MCH: 26.8 pg (ref 26.0–34.0)
MCHC: 31.8 g/dL (ref 30.0–36.0)
MCV: 84.4 fL (ref 78.0–100.0)
PLATELETS: 756 10*3/uL — AB (ref 150–400)
RBC: 3.84 MIL/uL — ABNORMAL LOW (ref 4.22–5.81)
RDW: 13.9 % (ref 11.5–15.5)
WBC: 12.2 10*3/uL — AB (ref 4.0–10.5)

## 2017-08-15 LAB — URINALYSIS, ROUTINE W REFLEX MICROSCOPIC
BACTERIA UA: NONE SEEN
Bilirubin Urine: NEGATIVE
Glucose, UA: NEGATIVE mg/dL
Ketones, ur: NEGATIVE mg/dL
Leukocytes, UA: NEGATIVE
Nitrite: NEGATIVE
Protein, ur: NEGATIVE mg/dL
SPECIFIC GRAVITY, URINE: 1.013 (ref 1.005–1.030)
pH: 7 (ref 5.0–8.0)

## 2017-08-15 LAB — BASIC METABOLIC PANEL
Anion gap: 9 (ref 5–15)
BUN: 16 mg/dL (ref 6–20)
CALCIUM: 8.9 mg/dL (ref 8.9–10.3)
CO2: 27 mmol/L (ref 22–32)
Chloride: 99 mmol/L — ABNORMAL LOW (ref 101–111)
Creatinine, Ser: 1.56 mg/dL — ABNORMAL HIGH (ref 0.61–1.24)
GFR calc Af Amer: 51 mL/min — ABNORMAL LOW (ref >60)
GFR, EST NON AFRICAN AMERICAN: 44 mL/min — AB (ref >60)
GLUCOSE: 107 mg/dL — AB (ref 65–99)
Potassium: 4.8 mmol/L (ref 3.5–5.1)
SODIUM: 135 mmol/L (ref 135–145)

## 2017-08-15 LAB — AMMONIA: AMMONIA: 36 umol/L — AB (ref 9–35)

## 2017-08-15 MED ORDER — OXYCODONE HCL 5 MG PO TABS
5.0000 mg | ORAL_TABLET | ORAL | Status: DC | PRN
  Administered 2017-08-16 – 2017-08-18 (×2): 10 mg via ORAL
  Filled 2017-08-15 (×2): qty 2

## 2017-08-15 MED ORDER — SODIUM CHLORIDE 0.9 % IV SOLN
2.0000 g | INTRAVENOUS | Status: DC
  Administered 2017-08-15 – 2017-08-18 (×4): 2 g via INTRAVENOUS
  Filled 2017-08-15 (×5): qty 2

## 2017-08-15 MED ORDER — MORPHINE SULFATE (PF) 2 MG/ML IV SOLN: 2.0000 mg | INTRAVENOUS | Status: DC | PRN

## 2017-08-15 NOTE — Progress Notes (Addendum)
PROGRESS NOTE    [name]  HCW:[id number] DOB: Apr 19, 1950 DOA: 08/10/2017 PCP: [facility]   Brief Narrative by Dr Rockne Menghini; "Gabriel Romero is an 67 y.o. 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  CT of the abdomen and pelvis showed multiple large liver masses consistent with metastatic disease from an unknown primary.  Also found to have AKI."   Patient underwent liver biopsy 5-23. Pathology consistent with [place]. Dr Marin Olp consulted who recommend MRI liver to evaluate if patient will be candidate for surgery.   Assessment & Plan:   Principal Problem:   Liver metastasis ([place]) associated with right upper quadrant pain Active Problems:   Seizure disorder (HCC)   Tobacco abuse   HTN (hypertension)   Acute renal failure superimposed on stage 2 chronic kidney disease (HCC)   Liver masses   Thrombocytosis (HCC)   Normocytic anemia   AKI (acute kidney injury) ([place])   Hyponatremia  Liver Mass, Right upper Quadrant pain. HCC. New diagnosis.  -CT abdomen showed liver masses concerning for metastasis.   -CT of the chest did not show any evidence of lung cancer -AF protein  at 13,746.  -underwent CT guided biopsy  5-23, pathology consistent with [facility].  -Dr Marin Olp consulted. Plan for MRI liver to determine if patient is candidate for surgery.  -he is still requiring IV morphine. Will change to Q 4 hours from every hour. He has not required more than 3 -to  4 hours IV pain meds. He is on schedule oxycodone, discussed with nurse if patient is sedated we should hold schedule oxycodone. He is also on oxycontin PRN for pain.   Fever; will check chest x ray and UA, urine culture. Depending on result will start antibiotics.   Encephalopathy;  per daughter patient has been confuse since after stroke, but more  confused at admission.  -ammonia mildly elevated. He was started on lactulose.  -will have to be careful with opioids.  -now with fever; work up for infection in process.   Seizure; on keppra continue.   HTN;  Continue to hold HCTZ and lisinopril due to AKI.   Acute renal failure superimposed on stage 2 chronic kidney disease ([place] Baseline creatinine appears to be around 1.2.  Cr peak to 2.  Holding HCTZ and lisinopril.  Continue with IV fluids.  Cr trending down.   Thrombocytosis; reactive.   Normocytic anemia;  Monitor. Hb yesterday at 9. Repeat labs today.     Weight loss/underweight Body mass index is 26.55 kg/m. Evaluated by dietician, continue Ensure. 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  Hyponatremia; mild.   DVT prophylaxis: heparin.  Code Status: full code.  Family Communication: no family at bedside.  Disposition Plan: to be determine, will need SNF. Still requiring IV pain meds and evaluation for HCC>    Consultants:   Dr Marin Olp.   IR>    Procedures:  Liver Biopsy by IR   Antimicrobials: none  Subjective: *he was sleeping on my arrival to room. He wake up to voice. He scream to have pain.  He is confused.    Objective: Vitals:   08/14/17 2009 08/15/17 0617 08/15/17 0619 08/15/17 1331  BP: 134/68 (!) 166/134 (!) 163/78 (!) 167/86  Pulse: 74 (!) 103 65 98  Resp: 16 16  18   Temp: 99.1 F (37.3 C) 97.9 F (36.6 C)  (!)  100.5 F (38.1 C)  TempSrc: Oral Oral  Oral  SpO2: 96% 95%  94%  Weight:      Height:        Intake/Output Summary (Last 24 hours) at 08/15/2017 1358 Last data filed at 08/15/2017 0920 Gross per 24 hour  Intake 1408.75 ml  Output 1100 ml  Net 308.75 ml   Filed Weights   08/13/17 0553 08/13/17 0825 08/14/17 0531  Weight: 77.3 kg (170 lb 6.7 oz) 76.8 kg (169 lb 5 oz) 76.9 kg (169 lb 8.5 oz)    Examination:  General exam:sleepy, wake up to voice,  Respiratory system: normal respiratory effort. Crackles bases.    Cardiovascular system: S1 & S2 heard, RRR. No JVD, murmurs, rubs, gallops or clicks. No pedal edema. Gastrointestinal system: BS present, soft, mild tender.  Central nervous system: wake up and follows command.  Extremities: Symmetric 5 x 5 power. Skin: No rashes, lesions or ulcers Psychiatry: confuse    Data Reviewed: I have personally reviewed following labs and imaging studies  CBC: Recent Labs  Lab 08/10/17 1948 08/11/17 0327 08/14/17 0530  WBC 13.3*  --  11.3*  NEUTROABS  --  10.3*  --   HGB 10.5*  --  9.5*  HCT 33.2*  --  30.9*  MCV 84.9  --  85.6  PLT 839*  --  220*   Basic Metabolic Panel: Recent Labs  Lab 08/10/17 1948 08/11/17 0607 08/12/17 1201 08/14/17 0530  NA 134* 135 136 132*  K 4.0 4.1 4.4 4.2  CL 99* 101 103 96*  CO2 22 21* 21* 26  GLUCOSE 99 85 86 128*  BUN 26* 27* 23* 18  CREATININE 2.00* 1.92* 2.04* 1.63*  CALCIUM 8.9 8.5* 8.6* 8.8*   GFR: Estimated Creatinine Clearance: 41.1 mL/min (A) (by C-G formula based on SCr of 1.63 mg/dL (H)). Liver Function Tests: Recent Labs  Lab 08/10/17 1948 08/11/17 0607 08/14/17 0530  AST 71* 57* 62*  ALT 32 26 26  ALKPHOS 260* 225* 225*  BILITOT 1.1 0.9 0.5  PROT 6.6 5.9* 5.8*  ALBUMIN 2.9* 2.6* 2.4*   Recent Labs  Lab 08/10/17 1948  LIPASE 22   Recent Labs  Lab 08/12/17 1428  AMMONIA 36*   Coagulation Profile: Recent Labs  Lab 08/11/17 0327  INR 1.15   Cardiac Enzymes: No results for input(s): CKTOTAL, CKMB, CKMBINDEX, TROPONINI in the last 168 hours. BNP (last 3 results) No results for input(s): PROBNP in the last 8760 hours. HbA1C: No results for input(s): HGBA1C in the last 72 hours. CBG: No results for input(s): GLUCAP in the last 168 hours. Lipid Profile: No results for input(s): CHOL, HDL, LDLCALC, TRIG, CHOLHDL, LDLDIRECT in the last 72 hours. Thyroid Function Tests: No results for input(s): TSH, T4TOTAL, FREET4, T3FREE, THYROIDAB in the last 72 hours. Anemia Panel: No  results for input(s): VITAMINB12, FOLATE, FERRITIN, TIBC, IRON, RETICCTPCT in the last 72 hours. Sepsis Labs: No results for input(s): PROCALCITON, LATICACIDVEN in the last 168 hours.  No results found for this or any previous visit (from the past 240 hour(s)).       Radiology Studies: No results found.      Scheduled Meds: . feeding supplement (ENSURE ENLIVE)  237 mL Oral TID BM  . heparin  5,000 Units Subcutaneous Q8H  . lactulose  20 g Oral Daily  . levETIRAcetam  500 mg Oral BID  . oxyCODONE  20 mg Oral Q12H   Continuous Infusions: . sodium chloride 75 mL/hr at 08/15/17  1235     LOS: 4 days    Time spent: 35 minutes.     Elmarie Shiley, MD [facility] Pager [phone]  If 7PM-7AM, please contact night-coverage [email or web address] Password [email or web address] 08/15/2017, 1:58 PM

## 2017-08-15 NOTE — Progress Notes (Signed)
ANTIBIOTIC CONSULT NOTE - INITIAL  Pharmacy Consult for cefepime  Indication: HCAP  Allergies  Allergen Reactions  . Sulfa Antibiotics Rash    Patient Measurements: Height: 5\' 7"  (170.2 cm) Weight: 169 lb 8.5 oz (76.9 kg) IBW/kg (Calculated) : 66.1   Vital Signs: Temp: 100.5 F (38.1 C) (05/25 1331) Temp Source: Oral (05/25 1331) BP: 167/86 (05/25 1331) Pulse Rate: 98 (05/25 1331) Intake/Output from previous day: 05/24 0701 - 05/25 0700 In: 1648.8 [P.O.:480; I.V.:1168.8] Out: 1200 [Urine:1200] Intake/Output from this shift: Total I/O In: 770 [P.O.:20; I.V.:750] Out: 100 [Urine:100]  Labs: Recent Labs    08/14/17 0530 08/15/17 1516  WBC 11.3* 12.2*  HGB 9.5* 10.3*  PLT 662* 756*  CREATININE 1.63* 1.56*   Estimated Creatinine Clearance: 43 mL/min (A) (by C-G formula based on SCr of 1.56 mg/dL (H)). No results for input(s): VANCOTROUGH, VANCOPEAK, VANCORANDOM, GENTTROUGH, GENTPEAK, GENTRANDOM, TOBRATROUGH, TOBRAPEAK, TOBRARND, AMIKACINPEAK, AMIKACINTROU, AMIKACIN in the last 72 hours.   Microbiology: No results found for this or any previous visit (from the past 720 hour(s)).  Medical History: Past Medical History:  Diagnosis Date  . CKD (chronic kidney disease), stage II   . ETOH abuse   . Hypertension   . Liver metastasis ([place]) 07/2017  . Seizures ([place])   . Stroke [facility])      Assessment: 67 yo male with HCAP to start on cefepime. Pharmacy asked to dose cefepime. Patient with AKI, CrCl~40-45 ml/min    Plan:  Cefepime 2gm IV q24h Monitor fever curve, clinical progress, CBC   Trena Dunavan A. Levada Dy, PharmD, [place] Pager: [phone]  08/15/2017,4:08 PM

## 2017-08-16 ENCOUNTER — Inpatient Hospital Stay (HOSPITAL_COMMUNITY): Payer: Medicare HMO

## 2017-08-16 LAB — URINE CULTURE: Culture: <10000 — AB

## 2017-08-16 MED ORDER — DIAZEPAM 2 MG PO TABS
2.0000 mg | ORAL_TABLET | Freq: Two times a day (BID) | ORAL | Status: DC | PRN
  Administered 2017-08-16 – 2017-08-17 (×2): 2 mg via ORAL
  Filled 2017-08-16 (×2): qty 1

## 2017-08-16 MED ORDER — GADOBENATE DIMEGLUMINE 529 MG/ML IV SOLN
15.0000 mL | Freq: Once | INTRAVENOUS | Status: AC | PRN
  Administered 2017-08-16: 15 mL via INTRAVENOUS

## 2017-08-16 MED ORDER — LACTULOSE 10 GM/15ML PO SOLN
20.0000 g | Freq: Two times a day (BID) | ORAL | Status: DC
  Administered 2017-08-16 – 2017-08-17 (×3): 20 g via ORAL
  Filled 2017-08-16 (×3): qty 30

## 2017-08-16 MED ORDER — SODIUM CHLORIDE 0.9 % IV BOLUS
250.0000 mL | Freq: Once | INTRAVENOUS | Status: AC
Start: 2017-08-16 — End: 2017-08-16
  Administered 2017-08-16: 250 mL via INTRAVENOUS

## 2017-08-16 MED ORDER — LORAZEPAM 2 MG/ML IJ SOLN
1.0000 mg | Freq: Once | INTRAMUSCULAR | Status: AC
  Administered 2017-08-16: 1 mg via INTRAVENOUS
  Filled 2017-08-16: qty 1

## 2017-08-16 MED ORDER — ORAL CARE MOUTH RINSE
15.0000 mL | Freq: Two times a day (BID) | OROMUCOSAL | Status: DC
  Administered 2017-08-16 – 2017-08-24 (×13): 15 mL via OROMUCOSAL

## 2017-08-16 NOTE — Progress Notes (Signed)
PROGRESS NOTE    Gabriel Romero  WUX:[id number] DOB: Dec 24, 1950 DOA: 08/10/2017 PCP: [facility]   Brief Narrative by Dr Rockne Menghini; "Gabriel Romero is an 67 y.o. 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  CT of the abdomen and pelvis showed multiple large liver masses consistent with metastatic disease from an unknown primary.  Also found to have AKI."   Patient underwent liver biopsy 5-23. Pathology consistent with [place]. Dr Marin Olp consulted who recommend MRI liver to evaluate if patient will be candidate for surgery.   Assessment & Plan:   Principal Problem:   Liver metastasis ([place]) associated with right upper quadrant pain Active Problems:   Seizure disorder (HCC)   Tobacco abuse   HTN (hypertension)   Acute renal failure superimposed on stage 2 chronic kidney disease (HCC)   Liver masses   Thrombocytosis (HCC)   Normocytic anemia   AKI (acute kidney injury) ([place])   Hyponatremia  Liver Mass, Right upper Quadrant pain. HCC. New diagnosis.  -CT abdomen showed liver masses concerning for metastasis.   -CT of the chest did not show any evidence of lung cancer -AF protein  at 13,746.  -underwent CT guided biopsy  5-23, pathology consistent with [facility].  -Dr Marin Olp consulted. Plan for MRI liver to determine if patient is candidate for surgery.  -he is still requiring IV morphine. Will change to Q 4 hours from every hour. He has not required more than 3 -to  4 hours IV pain meds. He is on schedule oxycodone, discussed with nurse if patient is sedated we should hold schedule oxycodone. He is also on oxycontin PRN for pain.  For MRI today   Fever; chest x ray bibasilar atelectasis.  UA negative, urine culture 10, K insignificant growth.  Blood culture no growth to date.,   Encephalopathy;  per daughter  patient has been confuse since after stroke, but more confused at admission.  -ammonia mildly elevated. He was started on lactulose.  -will have to be careful with opioids.  -now with fever; work up for infection in process.   Seizure; on keppra continue.   HTN;  Continue to hold HCTZ and lisinopril due to AKI.   Acute renal failure superimposed on stage 2 chronic kidney disease ([place] Baseline creatinine appears to be around 1.2.  Cr peak to 2.  Holding HCTZ and lisinopril.  Continue with IV fluids.  Cr trending down.   Thrombocytosis; reactive.   Normocytic anemia;  Monitor. Hb yesterday at 9. Repeat labs today.     Weight loss/underweight Body mass index is 26.55 kg/m. Evaluated by dietician, continue Ensure. 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  Hyponatremia; mild.   DVT prophylaxis: heparin.  Code Status: full code.  Family Communication: no family at bedside.  Disposition Plan: to be determine, will need SNF. Still requiring IV pain meds and evaluation for HCC>    Consultants:   Dr Marin Olp.   IR>    Procedures:  Liver Biopsy by IR   Antimicrobials: none  Subjective: He is lying down in bed, wake up to voice.  Complaints of right side abdominal pain    Objective: Vitals:   08/15/17 1331 08/15/17 1600 08/15/17 1700 08/16/17 0527  BP: (!) 167/86   (!) 160/90  Pulse: 98   90  Resp: 18   18  Temp: (!) 100.5 F (38.1 C) 99.4  F (37.4 C) 98.7 F (37.1 C) 98.5 F (36.9 C)  TempSrc: Oral Oral Oral Oral  SpO2: 94%   95%  Weight:      Height:        Intake/Output Summary (Last 24 hours) at 08/16/2017 0820 Last data filed at 08/16/2017 0540 Gross per 24 hour  Intake 2210 ml  Output 600 ml  Net 1610 ml   Filed Weights   08/13/17 0553 08/13/17 0825 08/14/17 0531  Weight: 77.3 kg (170 lb 6.7 oz) 76.8 kg (169 lb 5 oz) 76.9 kg (169 lb 8.5 oz)    Examination:  General exam: NAD Respiratory system: Crackles bases.  Cardiovascular system; S 1, S 2  RRR Gastrointestinal system: BS present, soft, nt Central nervous system:  confuse Extremities: no edema Skin: no rash  Psychiatry: confuse    Data Reviewed: I have personally reviewed following labs and imaging studies  CBC: Recent Labs  Lab 08/10/17 1948 08/11/17 0327 08/14/17 0530 08/15/17 1516  WBC 13.3*  --  11.3* 12.2*  NEUTROABS  --  10.3*  --   --   HGB 10.5*  --  9.5* 10.3*  HCT 33.2*  --  30.9* 32.4*  MCV 84.9  --  85.6 84.4  PLT 839*  --  662* 161*   Basic Metabolic Panel: Recent Labs  Lab 08/10/17 1948 08/11/17 0607 08/12/17 1201 08/14/17 0530 08/15/17 1516  NA 134* 135 136 132* 135  K 4.0 4.1 4.4 4.2 4.8  CL 99* 101 103 96* 99*  CO2 22 21* 21* 26 27  GLUCOSE 99 85 86 128* 107*  BUN 26* 27* 23* 18 16  CREATININE 2.00* 1.92* 2.04* 1.63* 1.56*  CALCIUM 8.9 8.5* 8.6* 8.8* 8.9   GFR: Estimated Creatinine Clearance: 43 mL/min (A) (by C-G formula based on SCr of 1.56 mg/dL (H)). Liver Function Tests: Recent Labs  Lab 08/10/17 1948 08/11/17 0607 08/14/17 0530  AST 71* 57* 62*  ALT 32 26 26  ALKPHOS 260* 225* 225*  BILITOT 1.1 0.9 0.5  PROT 6.6 5.9* 5.8*  ALBUMIN 2.9* 2.6* 2.4*   Recent Labs  Lab 08/10/17 1948  LIPASE 22   Recent Labs  Lab 08/12/17 1428 08/15/17 1516  AMMONIA 36* 36*   Coagulation Profile: Recent Labs  Lab 08/11/17 0327  INR 1.15   Cardiac Enzymes: No results for input(s): CKTOTAL, CKMB, CKMBINDEX, TROPONINI in the last 168 hours. BNP (last 3 results) No results for input(s): PROBNP in the last 8760 hours. HbA1C: No results for input(s): HGBA1C in the last 72 hours. CBG: No results for input(s): GLUCAP in the last 168 hours. Lipid Profile: No results for input(s): CHOL, HDL, LDLCALC, TRIG, CHOLHDL, LDLDIRECT in the last 72 hours. Thyroid Function Tests: No results for input(s): TSH, T4TOTAL, FREET4, T3FREE, THYROIDAB in the last 72 hours. Anemia Panel: No results for input(s): VITAMINB12, FOLATE, FERRITIN,  TIBC, IRON, RETICCTPCT in the last 72 hours. Sepsis Labs: No results for input(s): PROCALCITON, LATICACIDVEN in the last 168 hours.  No results found for this or any previous visit (from the past 240 hour(s)).       Radiology Studies: Dg Chest Port 1 View  Result Date: 08/15/2017 CLINICAL DATA:  Fever and body aches today EXAM: PORTABLE CHEST 1 VIEW COMPARISON:  08/11/2017 FINDINGS: Normal heart size. Low volumes. Bibasilar atelectasis. No pneumothorax or pleural effusion. IMPRESSION: Bibasilar atelectasis. Electronically Signed   By: Marybelle Killings M.D.   On: 08/15/2017 15:53        Scheduled  Meds: . feeding supplement (ENSURE ENLIVE)  237 mL Oral TID BM  . heparin  5,000 Units Subcutaneous Q8H  . lactulose  20 g Oral BID  . levETIRAcetam  500 mg Oral BID  . LORazepam  1 mg Intravenous Once  . oxyCODONE  20 mg Oral Q12H   Continuous Infusions: . sodium chloride 75 mL/hr at 08/16/17 0046  . ceFEPime (MAXIPIME) IV Stopped (08/15/17 1720)  . sodium chloride       LOS: 5 days    Time spent: 35 minutes.     Elmarie Shiley, MD [facility] Pager [phone]  If 7PM-7AM, please contact night-coverage [email or web address] Password [email or web address] 08/16/2017, 8:20 AM

## 2017-08-16 NOTE — Evaluation (Signed)
Clinical/Bedside Swallow Evaluation Patient Details  Name: Gabriel Romero MRN: [id number] Date of Birth: May 23, 1950  Today's Date: 08/16/2017 Time: SLP Start Time (ACUTE ONLY): 0930 SLP Stop Time (ACUTE ONLY): 0950 SLP Time Calculation (min) (ACUTE ONLY): 20 min  Past Medical History:  Past Medical History:  Diagnosis Date  . CKD (chronic kidney disease), stage II   . ETOH abuse   . Hypertension   . Liver metastasis ([place]) 07/2017  . Seizures ([place])   . Stroke [facility])    Past Surgical History:  Past Surgical History:  Procedure Laterality Date  . CERVICAL DISC SURGERY     HPI:  Gabriel Romero is an 67 y.o. 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  CT of the abdomen and pelvis showed multiple large liver masses consistent with metastatic disease from an unknown primary.  Also found to have AKI. Patient underwent liver biopsy 5-23. Pathology consistent with [place]. Pt with encephalopathy and fever; CXR showed bibasilar atelectasis. Evaluated by SLP during admission for CVA 06/19/16 for cognition only; passed stroke swallow screen at that time.    Assessment / Plan / Recommendation Clinical Impression   Pt presents with altered mental status and fluctuating alertness posing increased risk for aspiration. He is initially alert but confused, requiring cues and assistance to retrieve POs due to poor awareness, and he is unable to self-feed. Once he retrieves bolus, swallow initiation appears swift and timely and there are no overt signs of aspiration even when challenged with consecutive sips of thin liquids via straw. Manipulation is adequate for purees, however when presented with soft solids from his breakfast tray, pt repeatedly tried to slurp them from the fork despite cues. When a small piece was placed in his oral cavity, pt did  manipulate, however closed his eyes and required cues to maintain attention to PO. Moderate residue remaining after the swallow, which pt eventually cleared with mod cues and liquid wash. Recommend dys 1, thin liquids with full supervision, meds crushed in puree. Anticipate advancement of solids as his mentation improves. Feed only when alert. Will follow.     SLP Visit Diagnosis: Dysphagia, unspecified (R13.10)    Aspiration Risk  Mild aspiration risk    Diet Recommendation Dysphagia 1 (Puree);Thin liquid   Liquid Administration via: Cup;Straw Medication Administration: Crushed with puree Supervision: Full supervision/cueing for compensatory strategies Compensations: Slow rate;Small sips/bites;Minimize environmental distractions;Follow solids with liquid Postural Changes: Seated upright at 90 degrees    Other  Recommendations Oral Care Recommendations: Oral care BID   Follow up Recommendations Other (comment)(tbd)      Frequency and Duration min 2x/week  2 weeks       Prognosis Prognosis for Safe Diet Advancement: Good Barriers to Reach Goals: Cognitive deficits      Swallow Study   General Date of Onset: 08/10/17 HPI: Gabriel Romero is an 67 y.o. 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  CT of the abdomen and pelvis showed multiple large liver masses consistent with metastatic disease from an unknown primary.  Also found to have AKI. Patient underwent liver biopsy 5-23. Pathology consistent with [name]. Pt with encephalopathy and fever; CXR showed bibasilar atelectasis. Evaluated by SLP during admission for CVA 06/19/16 for  cognition only; passed stroke swallow screen at that time.  Type of Study: Bedside Swallow Evaluation Previous Swallow Assessment: none on file Diet Prior to this Study: Regular;Thin  liquids Temperature Spikes Noted: Yes(100.5) Respiratory Status: Room air History of Recent Intubation: No Behavior/Cognition: Alert;Confused;Other (Comment)(intermittently alert) Oral Cavity Assessment: Within Functional Limits Oral Care Completed by SLP: No Oral Cavity - Dentition: Adequate natural dentition Vision: Functional for self-feeding Self-Feeding Abilities: Needs assist Patient Positioning: Upright in bed Baseline Vocal Quality: Normal Volitional Cough: Strong Volitional Swallow: Able to elicit    Oral/Motor/Sensory Function Overall Oral Motor/Sensory Function: Other (comment)(WFL for tasks able to assess)   Ice Chips Ice chips: Impaired Presentation: Spoon Oral Phase Impairments: Poor awareness of bolus   Thin Liquid Thin Liquid: Within functional limits Presentation: Straw    Nectar Thick Nectar Thick Liquid: Not tested   Honey Thick Honey Thick Liquid: Not tested   Puree Puree: Within functional limits Presentation: Spoon   Solid   GO   Solid: Impaired Presentation: (fork) Oral Phase Impairments: Poor awareness of bolus Oral Phase Functional Implications: Prolonged oral transit;Impaired mastication;Oral residue       Deneise Lever, [place], [facility] Pathologist [phone]  Aliene Altes 08/16/2017,9:57 AM

## 2017-08-17 LAB — CBC
HCT: 31.2 % — ABNORMAL LOW (ref 39.0–52.0)
HEMOGLOBIN: 9.8 g/dL — AB (ref 13.0–17.0)
MCH: 26.5 pg (ref 26.0–34.0)
MCHC: 31.4 g/dL (ref 30.0–36.0)
MCV: 84.3 fL (ref 78.0–100.0)
Platelets: 680 10*3/uL — ABNORMAL HIGH (ref 150–400)
RBC: 3.7 MIL/uL — AB (ref 4.22–5.81)
RDW: 14.2 % (ref 11.5–15.5)
WBC: 15.4 10*3/uL — ABNORMAL HIGH (ref 4.0–10.5)

## 2017-08-17 LAB — BASIC METABOLIC PANEL
Anion gap: 11 (ref 5–15)
BUN: 19 mg/dL (ref 6–20)
CO2: 24 mmol/L (ref 22–32)
CREATININE: 1.79 mg/dL — AB (ref 0.61–1.24)
Calcium: 8.7 mg/dL — ABNORMAL LOW (ref 8.9–10.3)
Chloride: 101 mmol/L (ref 101–111)
GFR, EST AFRICAN AMERICAN: 43 mL/min — AB (ref >60)
GFR, EST NON AFRICAN AMERICAN: 38 mL/min — AB (ref >60)
Glucose, Bld: 103 mg/dL — ABNORMAL HIGH (ref 65–99)
POTASSIUM: 4.5 mmol/L (ref 3.5–5.1)
SODIUM: 136 mmol/L (ref 135–145)

## 2017-08-17 MED ORDER — LEVETIRACETAM 100 MG/ML PO SOLN
500.0000 mg | Freq: Two times a day (BID) | ORAL | Status: DC
  Administered 2017-08-17 – 2017-08-24 (×14): 500 mg via ORAL
  Filled 2017-08-17 (×14): qty 5

## 2017-08-17 MED ORDER — METRONIDAZOLE IN NACL 5-0.79 MG/ML-% IV SOLN
500.0000 mg | Freq: Three times a day (TID) | INTRAVENOUS | Status: DC
Start: 2017-08-17 — End: 2017-08-19
  Administered 2017-08-17 – 2017-08-19 (×6): 500 mg via INTRAVENOUS
  Filled 2017-08-17 (×6): qty 100

## 2017-08-17 MED ORDER — LACTULOSE 10 GM/15ML PO SOLN
30.0000 g | Freq: Three times a day (TID) | ORAL | Status: DC
  Administered 2017-08-17 – 2017-08-24 (×17): 30 g via ORAL
  Filled 2017-08-17 (×19): qty 45

## 2017-08-17 NOTE — Progress Notes (Signed)
PROGRESS NOTE    [name]  ELF:[id number] DOB: 10-Dec-1950 DOA: 08/10/2017 PCP: [facility]   Brief Narrative by Dr Rockne Menghini; "Gabriel Romero is an 67 y.o. 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  CT of the abdomen and pelvis showed multiple large liver masses consistent with metastatic disease from an unknown primary.  Also found to have AKI."   Patient underwent liver biopsy 5-23. Pathology consistent with [place]. Dr Marin Olp consulted who recommend MRI liver to evaluate if patient will be candidate for surgery.   Assessment & Plan:   Principal Problem:   Liver metastasis ([place]) associated with right upper quadrant pain Active Problems:   Seizure disorder (HCC)   Tobacco abuse   HTN (hypertension)   Acute renal failure superimposed on stage 2 chronic kidney disease (HCC)   Liver masses   Thrombocytosis (HCC)   Normocytic anemia   AKI (acute kidney injury) ([place])   Hyponatremia  Liver Mass, Right upper Quadrant pain. HCC. New diagnosis.  -CT abdomen showed liver masses concerning for metastasis.   -CT of the chest did not show any evidence of lung cancer -AF protein  at 13,746.  -underwent CT guided biopsy  5-23, pathology consistent with [facility].  -Dr Marin Olp consulted. Plan for MRI liver to determine if patient is candidate for surgery.  -he is still requiring IV morphine. Will change to Q 4 hours from every hour. He has not required more than 3 -to  4 hours IV pain meds. He is on schedule oxycodone, discussed with nurse if patient is sedated we should hold schedule oxycodone. He is also on oxycontin PRN for pain.  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 No convincing evidence of left-sided disease.    Will consult surgery as recommended by Dr Marin Olp.   Fever; Leukocytosis.  chest x ray bibasilar atelectasis.  UA negative, urine culture 10, K insignificant growth.  Blood culture no growth to date.,  On Cefepime. WBC continue to increase, will add flagyl to cover for anaerobes.   Encephalopathy;  per daughter patient has been confuse since after stroke, but more confused at admission.  -ammonia mildly elevated. He was started on lactulose.  -will have to be careful with opioids.  -now with fever; work up for infection in process.  No BM reported, will increase lactulose.   Seizure; on keppra continue.   HTN;  Continue to hold HCTZ and lisinopril due to AKI.   Acute renal failure superimposed on stage 2 chronic kidney disease ([place] Baseline creatinine appears to be around 1.2.  Cr peak to 2.  Holding HCTZ and lisinopril.  Continue with IV fluids.  Cr trending down.   Thrombocytosis; reactive.   Normocytic anemia;  Monitor. Hb yesterday at 9. Repeat labs today.    Weight loss/underweight Body mass index is 26.55 kg/m. Evaluated by dietician, continue Ensure. 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  Hyponatremia; mild.   DVT prophylaxis: heparin.  Code Status: full code.  Family Communication: no family at bedside.  Disposition Plan: to be determine, will need SNF. Still requiring IV pain meds and evaluation for HCC>    Consultants:   Dr Marin Olp.   IR>    Procedures:  Liver Biopsy by IR   Antimicrobials: none  Subjective: In bed, confuse. He is able to  tell me his name.    Objective: Vitals:   08/16/17 0527 08/16/17 1333 08/16/17 2043 08/17/17 0433  BP: (!) 160/90 (!) 161/87 (!) 155/70 (!) 137/55  Pulse: 90 97 (!) 102 100  Resp: 18 16 16 18   Temp: 98.5 F (36.9 C)  98.5 F (36.9 C) 99.2 F (37.3 C)  TempSrc: Oral  Oral Oral  SpO2: 95%     Weight:    77.2 kg (170 lb 3.1 oz)  Height:        Intake/Output Summary (Last 24 hours) at 08/17/2017  1307 Last data filed at 08/17/2017 0900 Gross per 24 hour  Intake 2736.67 ml  Output -  Net 2736.67 ml   Filed Weights   08/13/17 0825 08/14/17 0531 08/17/17 0433  Weight: 76.8 kg (169 lb 5 oz) 76.9 kg (169 lb 8.5 oz) 77.2 kg (170 lb 3.1 oz)    Examination:  General exam: NAD Respiratory system: Bilateral ronchus Cardiovascular system; S 1, S 2 RRR Gastrointestinal system: BS present, soft, nt Central nervous system: confuse, follows command.  Extremities: no edema Skin: no rash  Psychiatry: confuse    Data Reviewed: I have personally reviewed following labs and imaging studies  CBC: Recent Labs  Lab 08/10/17 1948 08/11/17 0327 08/14/17 0530 08/15/17 1516 08/17/17 0741  WBC 13.3*  --  11.3* 12.2* 15.4*  NEUTROABS  --  10.3*  --   --   --   HGB 10.5*  --  9.5* 10.3* 9.8*  HCT 33.2*  --  30.9* 32.4* 31.2*  MCV 84.9  --  85.6 84.4 84.3  PLT 839*  --  662* 756* 338*   Basic Metabolic Panel: Recent Labs  Lab 08/11/17 0607 08/12/17 1201 08/14/17 0530 08/15/17 1516 08/17/17 0741  NA 135 136 132* 135 136  K 4.1 4.4 4.2 4.8 4.5  CL 101 103 96* 99* 101  CO2 21* 21* 26 27 24   GLUCOSE 85 86 128* 107* 103*  BUN 27* 23* 18 16 19   CREATININE 1.92* 2.04* 1.63* 1.56* 1.79*  CALCIUM 8.5* 8.6* 8.8* 8.9 8.7*   GFR: Estimated Creatinine Clearance: 37.4 mL/min (A) (by C-G formula based on SCr of 1.79 mg/dL (H)). Liver Function Tests: Recent Labs  Lab 08/10/17 1948 08/11/17 0607 08/14/17 0530  AST 71* 57* 62*  ALT 32 26 26  ALKPHOS 260* 225* 225*  BILITOT 1.1 0.9 0.5  PROT 6.6 5.9* 5.8*  ALBUMIN 2.9* 2.6* 2.4*   Recent Labs  Lab 08/10/17 1948  LIPASE 22   Recent Labs  Lab 08/12/17 1428 08/15/17 1516  AMMONIA 36* 36*   Coagulation Profile: Recent Labs  Lab 08/11/17 0327  INR 1.15   Cardiac Enzymes: No results for input(s): CKTOTAL, CKMB, CKMBINDEX, TROPONINI in the last 168 hours. BNP (last 3 results) No results for input(s): PROBNP in the last  8760 hours. HbA1C: No results for input(s): HGBA1C in the last 72 hours. CBG: No results for input(s): GLUCAP in the last 168 hours. Lipid Profile: No results for input(s): CHOL, HDL, LDLCALC, TRIG, CHOLHDL, LDLDIRECT in the last 72 hours. Thyroid Function Tests: No results for input(s): TSH, T4TOTAL, FREET4, T3FREE, THYROIDAB in the last 72 hours. Anemia Panel: No results for input(s): VITAMINB12, FOLATE, FERRITIN, TIBC, IRON, RETICCTPCT in the last 72 hours. Sepsis Labs: No results for input(s): PROCALCITON, LATICACIDVEN in the last 168 hours.  Recent Results (from the past 240 hour(s))  Urine Culture     Status: Abnormal   Collection Time: 08/15/17  1:58 PM  Result Value Ref Range Status   Specimen Description URINE, CLEAN CATCH  Final   Special Requests NONE  Final   Culture (A)  Final    <10,000 COLONIES/mL INSIGNIFICANT GROWTH Performed at [facility], [address], [place], [place] [address]    Report Status 08/16/2017 FINAL  Final  Culture, blood (routine x 2)     Status: None (Preliminary result)   Collection Time: 08/15/17  3:16 PM  Result Value Ref Range Status   Specimen Description BLOOD LEFT HAND  Final   Special Requests   Final    BOTTLES DRAWN AEROBIC AND ANAEROBIC Blood Culture adequate volume   Culture   Final    NO GROWTH < 24 HOURS Performed at [facility], [place] [address] [place], [place] [address]    Report Status PENDING  Incomplete  Culture, blood (routine x 2)     Status: None (Preliminary result)   Collection Time: 08/15/17  3:16 PM  Result Value Ref Range Status   Specimen Description BLOOD LEFT HAND  Final   Special Requests   Final    BOTTLES DRAWN AEROBIC AND ANAEROBIC Blood Culture adequate volume   Culture   Final    NO GROWTH < 24 HOURS Performed at [facility], [place] [address], [place], [place] [address]    Report Status PENDING  Incomplete         Radiology Studies: Mr Liver W Wo Contrast  Result Date:  08/16/2017 CLINICAL DATA:  Biopsy-proven hepatocellular carcinoma. History of alcohol abuse. Seizure disorder. Evaluate if surgical candidate. EXAM: MRI ABDOMEN WITHOUT AND WITH CONTRAST TECHNIQUE: Multiplanar multisequence MR imaging of the abdomen was performed both before and after the administration of intravenous contrast. CONTRAST:  91mL MULTIHANCE GADOBENATE DIMEGLUMINE 529 MG/ML IV SOLN COMPARISON:  Abdominal CT 08/10/2017. Remote abdominal CT 03/04/2008. FINDINGS: Mild to moderate motion degradation throughout. Lower chest: Mild cardiomegaly, without pericardial or pleural effusion. Hepatobiliary: Marked hepatomegaly at 25.3 cm craniocaudal. Multiple hepatic masses with similar morphology, including T2 hyperintensity, post-contrast hypoenhancement and delayed peripheral enhancement. Dominant lesions including within segment 7 and 8 at 11.5 x 11.7 cm including on image 12/11. Centered in segment 6 at 10.2 by 8.1 cm on image 32/11. Separate adjacent lesion within segments 5 and 6 including at 10.6 x 11.4 cm on image 42/11. A lesion that is favored to be positioned within the anterior right hepatic lobe (with mass-effect impinging upon the medial segment left liver lobe) at 9.1 x 8.1 cm including on image 20/11. Left hepatic lobe cyst. Normal gallbladder, without biliary ductal dilatation. Pancreas:  Grossly normal pancreas. Spleen:  Normal in size, without focal abnormality. Adrenals/Urinary Tract: Normal right adrenal gland. Left adrenal lesion demonstrates signal dropout on out of phase imaging and measures 3.5 x 3.5 cm on image 69/1301. Similar to decreased in size compared to 03/04/2008. Bilateral renal lesions which are likely cysts. A dominant interpolar exophytic 6.2 cm right renal cyst. Stomach/Bowel: Grossly normal stomach and abdominal bowel loops. Vascular/Lymphatic: Aortic atherosclerosis. Infrarenal abdominal aortic ectasia. 3.2 cm. Bilateral common iliac artery ectasia, including at 1.9 cm on  the right and 1.8 cm on the left. No retroperitoneal or retrocrural adenopathy. Other:  No ascites. Musculoskeletal: S-shaped thoracolumbar spine curvature. IMPRESSION: 1. Mild to moderate motion degradation. 2. 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 No convincing evidence of left-sided disease. If the patient is considered a surgical candidate, repeat should be  considered when patient is able to follow directions and hold breath, in order to better visualize the course of the middle hepatic vein. 3. Left adrenal adenoma. Electronically Signed   By: Abigail Miyamoto M.D.   On: 08/16/2017 16:57   Dg Chest Port 1 View  Result Date: 08/15/2017 CLINICAL DATA:  Fever and body aches today EXAM: PORTABLE CHEST 1 VIEW COMPARISON:  08/11/2017 FINDINGS: Normal heart size. Low volumes. Bibasilar atelectasis. No pneumothorax or pleural effusion. IMPRESSION: Bibasilar atelectasis. Electronically Signed   By: Marybelle Killings M.D.   On: 08/15/2017 15:53        Scheduled Meds: . feeding supplement (ENSURE ENLIVE)  237 mL Oral TID BM  . heparin  5,000 Units Subcutaneous Q8H  . lactulose  30 g Oral TID  . levETIRAcetam  500 mg Oral BID  . mouth rinse  15 mL Mouth Rinse BID  . oxyCODONE  20 mg Oral Q12H   Continuous Infusions: . sodium chloride 100 mL/hr at 08/17/17 0246  . ceFEPime (MAXIPIME) IV Stopped (08/16/17 1740)     LOS: 6 days    Time spent: 35 minutes.     Elmarie Shiley, MD [facility] Pager [phone]  If 7PM-7AM, please contact night-coverage [email or web address] Password [email or web address] 08/17/2017, 1:07 PM

## 2017-08-17 NOTE — Progress Notes (Signed)
Gabriel Romero did have the MRI done.  The quality was not that great because, I suspect, that he was moving a little bit.  However, the MRI did not show any obvious disease on the left side of his liver.  It does not look like he has obvious metastatic disease.  As such, I suspect that we might be looking at some form of hepatic therapy.  Ideally, I think that a partial hepatectomy would be warranted.  I am not sure if surgery would feel the same way about this.  However, I probably would have surgery take a look at him to see what they would have to say.  Sometimes, they can do surgery but would like to see the tumor burden decreased.  If that is the case, then I would see if interventional radiology would consider doing some form of intrahepatic therapy (i.e. embolization) to see if this would help decrease the tumor burden.  He still has some confusion.  Again, this appears to have been after he had his CVA/seizure last year.  He does have some element of anemia.  He has thrombocytosis.  I probably would check iron studies on him.  For right now, I do not see any obvious indication for systemic therapy since his disease is confined to his liver.  Lattie Haw, MD  Deuteronomy 21:10

## 2017-08-17 NOTE — Consult Note (Signed)
Reason for Consult: Liver mass Referring Physician: [name] is an 67 y.o. male.  HPI: Pt's hx has been obtained per the chart review due to review due to the patient's mental status.  There is no family in the room on my examination.  Patient has a history of hypertension, previous CVA/encephalopathy, history of alcohol abuse.  Patient was recently admitted on 5/21 with ongoing right upper quadrant pain.  Patient underwent work-up for this.  Patient underwent CT scan which I did review these images personally.  Patient was seen to have a large right hepatic mass consistent with hepatocellular carcinoma.  Patient also had elevated CEA and alpha-fetoprotein with 13,000.  [name] of hematology oncology was consulted who recommended surgical evaluation for possible partial hepatectomy.    Past Medical History:  Diagnosis Date  . CKD (chronic kidney disease), stage II   . ETOH abuse   . Hypertension   . Liver metastasis ([place]) 07/2017  . Seizures ([place])   . Stroke [facility])     Past Surgical History:  Procedure Laterality Date  . CERVICAL DISC SURGERY      Family History  Problem Relation Age of Onset  . Dementia Mother   . Hypertension Father   . Colon cancer Neg Hx     Social History:  reports that he has been smoking cigarettes.  He has been smoking about 1.50 packs per day. He has never used smokeless tobacco. He reports that he drank about 7.2 oz of alcohol per week. He reports that he has current or past drug history. Drug: Marijuana. Frequency: 7.00 times per week.  Allergies:  Allergies  Allergen Reactions  . Sulfa Antibiotics Rash    Medications: I have reviewed the patient's current medications.  Results for orders placed or performed during the hospital encounter of 08/10/17 (from the past 48 hour(s))  CBC     Status: Abnormal   Collection Time: 08/17/17  7:41 AM  Result Value Ref Range   WBC 15.4 (H) 4.0 - 10.5 K/uL   RBC 3.70 (L) 4.22 -  5.81 MIL/uL   Hemoglobin 9.8 (L) 13.0 - 17.0 g/dL   HCT 31.2 (L) 39.0 - 52.0 %   MCV 84.3 78.0 - 100.0 fL   MCH 26.5 26.0 - 34.0 pg   MCHC 31.4 30.0 - 36.0 g/dL   RDW 14.2 11.5 - 15.5 %   Platelets 680 (H) 150 - 400 K/uL    Comment: Performed at [facility], [place] [address], [place], [place] [address]  Basic metabolic panel     Status: Abnormal   Collection Time: 08/17/17  7:41 AM  Result Value Ref Range   Sodium 136 135 - 145 mmol/L   Potassium 4.5 3.5 - 5.1 mmol/L   Chloride 101 101 - 111 mmol/L   CO2 24 22 - 32 mmol/L   Glucose, Bld 103 (H) 65 - 99 mg/dL   BUN 19 6 - 20 mg/dL   Creatinine, Ser 1.79 (H) 0.61 - 1.24 mg/dL   Calcium 8.7 (L) 8.9 - 10.3 mg/dL   GFR calc non Af Amer 38 (L) >60 mL/min   GFR calc Af Amer 43 (L) >60 mL/min    Comment: (NOTE) The eGFR has been calculated using the CKD EPI equation. This calculation has not been validated in all clinical situations. eGFR's persistently <60 mL/min signify possible Chronic Kidney Disease.    Anion gap 11 5 - 15    Comment: Performed at [facility], [place] [address], [place], [place] [address]    Mr Liver W Wo Contrast  Result Date: 08/16/2017 CLINICAL DATA:  Biopsy-proven hepatocellular carcinoma. History of alcohol abuse. Seizure disorder. Evaluate if surgical candidate. EXAM: MRI ABDOMEN WITHOUT AND WITH CONTRAST TECHNIQUE: Multiplanar multisequence MR imaging of the abdomen was performed both before and after the administration of intravenous contrast. CONTRAST:  73m MULTIHANCE GADOBENATE DIMEGLUMINE 529 MG/ML IV SOLN COMPARISON:  Abdominal CT 08/10/2017. Remote abdominal CT 03/04/2008. FINDINGS: Mild to moderate motion degradation throughout. Lower chest: Mild cardiomegaly, without pericardial or pleural effusion. Hepatobiliary: Marked hepatomegaly at 25.3 cm craniocaudal. Multiple hepatic masses with similar morphology, including T2 hyperintensity, post-contrast hypoenhancement and delayed peripheral  enhancement. Dominant lesions including within segment 7 and 8 at 11.5 x 11.7 cm including on image 12/11. Centered in segment 6 at 10.2 by 8.1 cm on image 32/11. Separate adjacent lesion within segments 5 and 6 including at 10.6 x 11.4 cm on image 42/11. A lesion that is favored to be positioned within the anterior right hepatic lobe (with mass-effect impinging upon the medial segment left liver lobe) at 9.1 x 8.1 cm including on image 20/11. Left hepatic lobe cyst. Normal gallbladder, without biliary ductal dilatation. Pancreas:  Grossly normal pancreas. Spleen:  Normal in size, without focal abnormality. Adrenals/Urinary Tract: Normal right adrenal gland. Left adrenal lesion demonstrates signal dropout on out of phase imaging and measures 3.5 x 3.5 cm on image 69/1301. Similar to decreased in size compared to 03/04/2008. Bilateral renal lesions which are likely cysts. A dominant interpolar exophytic 6.2 cm right renal cyst. Stomach/Bowel: Grossly normal stomach and abdominal bowel loops. Vascular/Lymphatic: Aortic atherosclerosis. Infrarenal abdominal aortic ectasia. 3.2 cm. Bilateral common iliac artery ectasia, including at 1.9 cm on the right and 1.8 cm on the left. No retroperitoneal or retrocrural adenopathy. Other:  No ascites. Musculoskeletal: S-shaped thoracolumbar spine curvature. IMPRESSION: 1. Mild to moderate motion degradation. 2. 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 No convincing evidence of left-sided disease. If the patient is considered a surgical candidate, repeat should be considered when patient is able to follow directions and hold breath, in order to better visualize the course of the middle hepatic vein. 3. Left adrenal adenoma. Electronically Signed   By: KAbigail MiyamotoM.D.   On: 08/16/2017 16:57    Review of Systems  Unable to perform ROS: Mental status change  Constitutional: Negative  for chills, fever and malaise/fatigue.  HENT: Negative for ear discharge, hearing loss and sore throat.   Eyes: Negative for blurred vision and discharge.  Respiratory: Negative for cough and shortness of breath.   Cardiovascular: Negative for chest pain, orthopnea and leg swelling.  Gastrointestinal: Negative for abdominal pain, constipation, diarrhea, heartburn, nausea and vomiting.  Musculoskeletal: Negative for myalgias and neck pain.  Skin: Negative for itching and rash.  Neurological: Negative for dizziness, focal weakness, seizures and loss of consciousness.  Endo/Heme/Allergies: Negative for environmental allergies. Does not bruise/bleed easily.  Psychiatric/Behavioral: Negative for depression and suicidal ideas.   Blood pressure (!) 149/90, pulse 98, temperature 97.7 F (36.5 C), temperature source Oral, resp. rate 18, height '5\' 7"'$  (1.702 m), weight 77.2 kg (170 lb 3.1 oz), SpO2 94 %. Physical Exam  Constitutional: He is oriented to person, place, and time. Vital signs are normal. He appears well-developed and well-nourished.  Conversant No acute distress  Eyes: Lids are normal. No scleral icterus.  No lid lag Moist conjunctiva  Neck: No tracheal tenderness present. No thyromegaly present.  No cervical lymphadenopathy  Cardiovascular: Normal rate, regular rhythm and intact distal pulses.  No murmur heard. Respiratory: Effort normal and breath sounds normal. He has no wheezes. He has no rales.  GI: Soft. He exhibits no distension and no mass. There is hepatomegaly. There is no hepatosplenomegaly. There is tenderness (min ttp RUQ). There is no rebound and no guarding. No hernia.  Neurological: He is alert and oriented to person, place, and time.  Normal gait and station  Skin: Skin is warm. No rash noted. No cyanosis. Nails show no clubbing.  Normal skin turgor  Psychiatric: Cognition and memory are impaired. He expresses inappropriate judgment.  Appropriate affect     Assessment/Plan: 67 year old male with right hepatic mass. Hypertension CVA Alcohol abuse Acute renal failure Thrombocytosis  Plan: Patient is a large hepatic mass.  We will discuss the pt with Dr. Barry Dienes and have her review the patient's CT scan and MRI and evaluate for possible likelihood of surgical excision. We will follow along  Gabriel Jacks., Gabriel Romero 08/17/2017, 4:55 PM

## 2017-08-18 ENCOUNTER — Inpatient Hospital Stay (HOSPITAL_COMMUNITY): Payer: Medicare HMO

## 2017-08-18 LAB — BASIC METABOLIC PANEL
Anion gap: 9 (ref 5–15)
BUN: 22 mg/dL — ABNORMAL HIGH (ref 6–20)
CALCIUM: 8.6 mg/dL — AB (ref 8.9–10.3)
CO2: 26 mmol/L (ref 22–32)
CREATININE: 1.57 mg/dL — AB (ref 0.61–1.24)
Chloride: 102 mmol/L (ref 101–111)
GFR, EST AFRICAN AMERICAN: 51 mL/min — AB (ref >60)
GFR, EST NON AFRICAN AMERICAN: 44 mL/min — AB (ref >60)
GLUCOSE: 105 mg/dL — AB (ref 65–99)
Potassium: 4.2 mmol/L (ref 3.5–5.1)
Sodium: 137 mmol/L (ref 135–145)

## 2017-08-18 LAB — CBC
HCT: 29.8 % — ABNORMAL LOW (ref 39.0–52.0)
Hemoglobin: 9.4 g/dL — ABNORMAL LOW (ref 13.0–17.0)
MCH: 26.7 pg (ref 26.0–34.0)
MCHC: 31.5 g/dL (ref 30.0–36.0)
MCV: 84.7 fL (ref 78.0–100.0)
PLATELETS: 600 10*3/uL — AB (ref 150–400)
RBC: 3.52 MIL/uL — ABNORMAL LOW (ref 4.22–5.81)
RDW: 14.1 % (ref 11.5–15.5)
WBC: 13.8 10*3/uL — ABNORMAL HIGH (ref 4.0–10.5)

## 2017-08-18 MED ORDER — LORAZEPAM 2 MG/ML IJ SOLN: 1.0000 mg | Freq: Once | INTRAMUSCULAR | Status: DC

## 2017-08-18 NOTE — Progress Notes (Signed)
PROGRESS NOTE    Gabriel Romero  YIF:[id number] DOB: [birth year] DOA: 08/10/2017 PCP: [facility]   Brief Narrative by Dr Rockne Menghini; "Gabriel Romero is an 67 y.o. 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  CT of the abdomen and pelvis showed multiple large liver masses consistent with metastatic disease from an unknown primary.  Also found to have AKI."   Patient underwent liver biopsy 5-23. Pathology consistent with [place]. Dr Marin Olp consulted who recommend MRI liver to evaluate if patient will be candidate for surgery.  MRI live preformed, no diseases on the left side. Surgery consulted. Awaiting Dr Barry Dienes evaluation.   Assessment & Plan:   Principal Problem:   Liver metastasis ([place]) associated with right upper quadrant pain Active Problems:   Seizure disorder (HCC)   Tobacco abuse   HTN (hypertension)   Acute renal failure superimposed on stage 2 chronic kidney disease (HCC)   Liver masses   Thrombocytosis (HCC)   Normocytic anemia   AKI (acute kidney injury) ([place])   Hyponatremia  Liver Mass, Right upper Quadrant pain. HCC. New diagnosis.  -CT abdomen showed liver masses concerning for metastasis.   -CT of the chest did not show any evidence of lung cancer -AF protein  at 13,746.  -underwent CT guided biopsy  5-23, pathology consistent with [facility].  -Dr Marin Olp consulted. Plan for MRI liver to determine if patient is candidate for surgery.  -he is still requiring IV morphine. Will change to Q 4 hours from every hour. He has not required more than 3 -to  4 hours IV pain meds. He is on schedule oxycodone, discussed with nurse if patient is sedated we should hold schedule oxycodone. He is also on oxycontin PRN for pain.  MRI ; Hepatomegaly with multiple right-sided hepatic masses, consistent with the clinical  history of tissue proven hepatocellular carcinoma. No convincing evidence of left-sided disease.  Will consult surgery as recommended by Dr Marin Olp.   Fever; Leukocytosis.  chest x ray bibasilar atelectasis.  UA negative, urine culture 10, K insignificant growth.  Blood culture no growth to date.,  On Cefepime. WBC continue to increase,  added  flagyl to cover for anaerobes.  Could consider repeating chest x ray.  WBC trending down.   Encephalopathy;  per daughter patient has been confuse since after stroke, but more confused at admission.  -ammonia mildly elevated. He was started on lactulose.  -will have to be careful with opioids.  -now with fever; work up for infection in process.  No BM reported, will increase lactulose.  Trying to stay away of valium   Seizure; on keppra continue.   HTN;  Continue to hold HCTZ and lisinopril due to AKI.   Acute renal failure superimposed on stage 2 chronic kidney disease ([place] Baseline creatinine appears to be around 1.2.  Cr peak to 2.  Holding HCTZ and lisinopril.  Continue with IV fluids.  Cr trending down. Today at 1.5   Thrombocytosis; reactive.   Normocytic anemia;  Monitor. Hb stable at 9   Weight loss/underweight Body mass index is 26.55 kg/m. Evaluated by dietician, continue Ensure. 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  Hyponatremia; mild.   DVT prophylaxis: heparin.  Code Status: full code.  Family Communication: Daughter over phone.  Disposition Plan: to be determine, will need SNF. Still requiring IV pain meds and evaluation for HCC>  Consultants:   Dr Marin Olp.   IR>    Procedures:  Liver Biopsy by IR   Antimicrobials: none  Subjective: He is confused, sleepy, wake up to voice.  Complaints of abdominal pain   Objective: Vitals:   08/18/17 0500 08/18/17 0505 08/18/17 1242 08/18/17 1304  BP:  (!) 152/79 131/61 (!) 144/99  Pulse:  88 61 100  Resp:  18 16   Temp:  98.8 F (37.1 C) 97.8 F (36.6 C)  98.4 F (36.9 C)  TempSrc:   Oral Oral  SpO2:  97% 94% 100%  Weight: 77.3 kg (170 lb 8 oz)     Height:        Intake/Output Summary (Last 24 hours) at 08/18/2017 1400 Last data filed at 08/18/2017 9735 Gross per 24 hour  Intake 2995 ml  Output 1150 ml  Net 1845 ml   Filed Weights   08/14/17 0531 08/17/17 0433 08/18/17 0500  Weight: 76.9 kg (169 lb 8.5 oz) 77.2 kg (170 lb 3.1 oz) 77.3 kg (170 lb 8 oz)    Examination:  General exam: NAD Respiratory system: Bilateral ronchus  Cardiovascular system; S 1, S 2 RRR Gastrointestinal system: BS present, soft, nt Central nervous system: confused, sleepy  Extremities: no edema Skin: no rash  Psychiatry: confuse    Data Reviewed: I have personally reviewed following labs and imaging studies  CBC: Recent Labs  Lab 08/14/17 0530 08/15/17 1516 08/17/17 0741 08/18/17 0407  WBC 11.3* 12.2* 15.4* 13.8*  HGB 9.5* 10.3* 9.8* 9.4*  HCT 30.9* 32.4* 31.2* 29.8*  MCV 85.6 84.4 84.3 84.7  PLT 662* 756* 680* 329*   Basic Metabolic Panel: Recent Labs  Lab 08/12/17 1201 08/14/17 0530 08/15/17 1516 08/17/17 0741 08/18/17 0407  NA 136 132* 135 136 137  K 4.4 4.2 4.8 4.5 4.2  CL 103 96* 99* 101 102  CO2 21* 26 27 24 26   GLUCOSE 86 128* 107* 103* 105*  BUN 23* 18 16 19  22*  CREATININE 2.04* 1.63* 1.56* 1.79* 1.57*  CALCIUM 8.6* 8.8* 8.9 8.7* 8.6*   GFR: Estimated Creatinine Clearance: 42.7 mL/min (A) (by C-G formula based on SCr of 1.57 mg/dL (H)). Liver Function Tests: Recent Labs  Lab 08/14/17 0530  AST 62*  ALT 26  ALKPHOS 225*  BILITOT 0.5  PROT 5.8*  ALBUMIN 2.4*   No results for input(s): LIPASE, AMYLASE in the last 168 hours. Recent Labs  Lab 08/12/17 1428 08/15/17 1516  AMMONIA 36* 36*   Coagulation Profile: No results for input(s): INR, PROTIME in the last 168 hours. Cardiac Enzymes: No results for input(s): CKTOTAL, CKMB, CKMBINDEX, TROPONINI in the last 168 hours. BNP (last 3 results) No results for  input(s): PROBNP in the last 8760 hours. HbA1C: No results for input(s): HGBA1C in the last 72 hours. CBG: No results for input(s): GLUCAP in the last 168 hours. Lipid Profile: No results for input(s): CHOL, HDL, LDLCALC, TRIG, CHOLHDL, LDLDIRECT in the last 72 hours. Thyroid Function Tests: No results for input(s): TSH, T4TOTAL, FREET4, T3FREE, THYROIDAB in the last 72 hours. Anemia Panel: No results for input(s): VITAMINB12, FOLATE, FERRITIN, TIBC, IRON, RETICCTPCT in the last 72 hours. Sepsis Labs: No results for input(s): PROCALCITON, LATICACIDVEN in the last 168 hours.  Recent Results (from the past 240 hour(s))  Urine Culture     Status: Abnormal   Collection Time: 08/15/17  1:58 PM  Result Value Ref Range Status   Specimen Description URINE, CLEAN CATCH  Final   Special Requests  NONE  Final   Culture (A)  Final    <10,000 COLONIES/mL INSIGNIFICANT GROWTH Performed at [facility] [address], [place], [place] [address]    Report Status 08/16/2017 FINAL  Final  Culture, blood (routine x 2)     Status: None (Preliminary result)   Collection Time: 08/15/17  3:16 PM  Result Value Ref Range Status   Specimen Description BLOOD LEFT HAND  Final   Special Requests   Final    BOTTLES DRAWN AEROBIC AND ANAEROBIC Blood Culture adequate volume   Culture   Final    NO GROWTH 2 DAYS Performed at [facility], [place] [address], [place], [place] [address]    Report Status PENDING  Incomplete  Culture, blood (routine x 2)     Status: None (Preliminary result)   Collection Time: 08/15/17  3:16 PM  Result Value Ref Range Status   Specimen Description BLOOD LEFT HAND  Final   Special Requests   Final    BOTTLES DRAWN AEROBIC AND ANAEROBIC Blood Culture adequate volume   Culture   Final    NO GROWTH 2 DAYS Performed at [facility], [place] [address] [place], [place] [address]    Report Status PENDING  Incomplete         Radiology Studies: Mr Liver W Wo  Contrast  Result Date: 08/16/2017 CLINICAL DATA:  Biopsy-proven hepatocellular carcinoma. History of alcohol abuse. Seizure disorder. Evaluate if surgical candidate. EXAM: MRI ABDOMEN WITHOUT AND WITH CONTRAST TECHNIQUE: Multiplanar multisequence MR imaging of the abdomen was performed both before and after the administration of intravenous contrast. CONTRAST:  23mL MULTIHANCE GADOBENATE DIMEGLUMINE 529 MG/ML IV SOLN COMPARISON:  Abdominal CT 08/10/2017. Remote abdominal CT 03/04/2008. FINDINGS: Mild to moderate motion degradation throughout. Lower chest: Mild cardiomegaly, without pericardial or pleural effusion. Hepatobiliary: Marked hepatomegaly at 25.3 cm craniocaudal. Multiple hepatic masses with similar morphology, including T2 hyperintensity, post-contrast hypoenhancement and delayed peripheral enhancement. Dominant lesions including within segment 7 and 8 at 11.5 x 11.7 cm including on image 12/11. Centered in segment 6 at 10.2 by 8.1 cm on image 32/11. Separate adjacent lesion within segments 5 and 6 including at 10.6 x 11.4 cm on image 42/11. A lesion that is favored to be positioned within the anterior right hepatic lobe (with mass-effect impinging upon the medial segment left liver lobe) at 9.1 x 8.1 cm including on image 20/11. Left hepatic lobe cyst. Normal gallbladder, without biliary ductal dilatation. Pancreas:  Grossly normal pancreas. Spleen:  Normal in size, without focal abnormality. Adrenals/Urinary Tract: Normal right adrenal gland. Left adrenal lesion demonstrates signal dropout on out of phase imaging and measures 3.5 x 3.5 cm on image 69/1301. Similar to decreased in size compared to 03/04/2008. Bilateral renal lesions which are likely cysts. A dominant interpolar exophytic 6.2 cm right renal cyst. Stomach/Bowel: Grossly normal stomach and abdominal bowel loops. Vascular/Lymphatic: Aortic atherosclerosis. Infrarenal abdominal aortic ectasia. 3.2 cm. Bilateral common iliac artery ectasia,  including at 1.9 cm on the right and 1.8 cm on the left. No retroperitoneal or retrocrural adenopathy. Other:  No ascites. Musculoskeletal: S-shaped thoracolumbar spine curvature. IMPRESSION: 1. Mild to moderate motion degradation. 2. 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 No convincing evidence of left-sided disease. If the patient is considered a surgical candidate, repeat should be considered when patient is able to follow directions and hold breath, in order to better visualize the course of the  middle hepatic vein. 3. Left adrenal adenoma. Electronically Signed   By: Abigail Miyamoto M.D.   On: 08/16/2017 16:57        Scheduled Meds: . feeding supplement (ENSURE ENLIVE)  237 mL Oral TID BM  . heparin  5,000 Units Subcutaneous Q8H  . lactulose  30 g Oral TID  . levETIRAcetam  500 mg Oral BID  . mouth rinse  15 mL Mouth Rinse BID  . oxyCODONE  20 mg Oral Q12H   Continuous Infusions: . sodium chloride 100 mL/hr at 08/18/17 0550  . ceFEPime (MAXIPIME) IV 2 g (08/17/17 1723)  . metronidazole Stopped (08/18/17 0659)     LOS: 7 days    Time spent: 35 minutes.     Elmarie Shiley, MD [facility] Pager [phone]  If 7PM-7AM, please contact night-coverage [email or web address] Password [email or web address] 08/18/2017, 2:00 PM

## 2017-08-18 NOTE — Progress Notes (Signed)
CC:  abdominal pain/liver mass  Subjective: Pt lying in bed with sitter and mittens, confused and cannot answer questions.  Moans and says abdomen hurts where every you touch him.    Objective: Vital signs in last 24 hours: Temp:  [97.7 F (36.5 C)-98.8 F (37.1 C)] 98.8 F (37.1 C) (05/28 0505) Pulse Rate:  [88-101] 88 (05/28 0505) Resp:  [18] 18 (05/28 0505) BP: (149-163)/(79-90) 152/79 (05/28 0505) SpO2:  [94 %-97 %] 97 % (05/28 0505) Weight:  [77.3 kg (170 lb 8 oz)] 77.3 kg (170 lb 8 oz) (05/28 0500) Last BM Date: 08/13/17 610 PO 2700 IV 1050 urine Afebrile, VSS. Creatinine 1.57 WBC 13.8 HH stable Platelets 600K   CT scan 5/20:   multiple (4-5) large hepatic masses, measuring up to approximately 9 x 9 cm. These are concentrated within the right hepatic lobe. The largest lesion is at the inferior margin of the liver.   MRI 5/26:   Hepatomegaly with multiple right-sided hepatic masses, consistent with the clinical history of tissue proven hepatocellular carcinoma. No convincing evidence of left-sided disease. If the patient is considered a surgical candidate, repeat should be considered when patient is able to follow directions and hold breath, in order to better visualize the course of the middle hepatic vein.  Left adrenal adenoma.  CT chest 5/21:  Extensive CAD, No enlarged mediastinal or axillary lymph nodes.  Thyroid gland, trachea, and esophagus demonstrate no significant findings.  No enlarged mediastinal or axillary lymph nodes.  No pneumothorax or pleural effusion is noted. Mild bilateral posterior basilar subsegmental atelectasis is noted.  No pneumothorax or pleural effusion is noted. Mild bilateral posterior basilar subsegmental atelectasis is noted.    Intake/Output from previous day: 05/27 0701 - 05/28 0700 In: 3285 [P.O.:610; I.V.:2475; IV Piggyback:200] Out: 1050 [Urine:1050] Intake/Output this shift: Total I/O In: 200 [P.O.:200] Out: 100  [Urine:100]  General appearance: alert and confused, in mittens with a sitter. Resp: clear to auscultation bilaterally and anterior GI: soft, ? distended, complains of pain with any light touch.    Lab Results:  Recent Labs    08/17/17 0741 08/18/17 0407  WBC 15.4* 13.8*  HGB 9.8* 9.4*  HCT 31.2* 29.8*  PLT 680* 600*    BMET Recent Labs    08/17/17 0741 08/18/17 0407  NA 136 137  K 4.5 4.2  CL 101 102  CO2 24 26  GLUCOSE 103* 105*  BUN 19 22*  CREATININE 1.79* 1.57*  CALCIUM 8.7* 8.6*   PT/INR No results for input(s): LABPROT, INR in the last 72 hours.  Recent Labs  Lab 08/14/17 0530  AST 62*  ALT 26  ALKPHOS 225*  BILITOT 0.5  PROT 5.8*  ALBUMIN 2.4*     Lipase     Component Value Date/Time   LIPASE 22 08/10/2017 1948     Medications: . feeding supplement (ENSURE ENLIVE)  237 mL Oral TID BM  . heparin  5,000 Units Subcutaneous Q8H  . lactulose  30 g Oral TID  . levETIRAcetam  500 mg Oral BID  . mouth rinse  15 mL Mouth Rinse BID  . oxyCODONE  20 mg Oral Q12H   . sodium chloride 100 mL/hr at 08/18/17 0550  . ceFEPime (MAXIPIME) IV 2 g (08/17/17 1723)  . metronidazole Stopped (08/18/17 5462)   Assessment/Plan Intracranial hemorrhage Hx of seizure disorder Hx of ETOH abuse - abstinent x 1 year Hypertension CKD stage II  Right hepatic mass  - US guided biopsy right  levery mass 08/12/17  - Liver, needle/core biopsy:- CARCINOMA  FEN:  IV fluids/D1 diet ID:  None DVT:  Heparin Follow up:  TBD/Ennever  Plan:  I talked with Dr. Barry Dienes and Dr. Tyrell Antonio.  Pt needs the MRI to be repeated without the motion artifact in order to come up with any treatment plan.  I recommended some sedation to Dr. Tyrell Antonio.  She will work on repeating the MRI.       LOS: 7 days    Domonik Levario 08/18/2017 [id number]

## 2017-08-18 NOTE — Progress Notes (Signed)
Physical Therapy Treatment Patient Details Name: Gabriel Romero MRN: [id number] DOB: Aug 19, 1950 Today's Date: 08/18/2017    History of Present Illness Gabriel Romero is an 67 y.o. 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  CT of the abdomen and pelvis showed multiple large liver masses consistent with metastatic disease from an unknown primary.  Also found to have AKI. Found to be primary liver cancer. No metastases noted on CT scan.    PT Comments    Pt with altered mental status and lethargy today during session. Pt required max verbal cuing with mobility and was only able to come to sitting at EoB with modA in order to allow physician to listen to lung sounds. Pt with extreme difficulty following one step commands and would eventually initiate a movement however seemed to forget what he was doing before he finished the task. Attempted standing with RW and then with HHA and pt was unable to come into upright. D/c plans remain appropriate. PT will continue to follow acutely.      Follow Up Recommendations  SNF;Supervision/Assistance - 24 hour     Equipment Recommendations  None recommended by PT(at this time.)    Recommendations for Other Services OT consult     Precautions / Restrictions Precautions Precautions: Fall Restrictions Weight Bearing Restrictions: No    Mobility  Bed Mobility Overal bed mobility: Needs Assistance Bed Mobility: Supine to Sit;Sit to Supine     Supine to sit: Mod assist;HOB elevated Sit to supine: HOB elevated;Max assist      Transfers Overall transfer level: Needs assistance Equipment used: 1 person hand held assist;Rolling walker (2 wheeled) Transfers: Sit to/from Stand Sit to Stand: Total assist;From elevated surface         General transfer comment: unable to attain  upright, attempted with RW and pt seemingly no knowledge of RW use for power up despite max verbal and moving hand to RW handle, attempted to powerup without AD  Ambulation/Gait             General Gait Details: unable to attempt        Balance Overall balance assessment: Needs assistance Sitting-balance support: Bilateral upper extremity supported;Feet supported Sitting balance-Leahy Scale: Fair                                      Cognition Arousal/Alertness: Lethargic Behavior During Therapy: WFL for tasks assessed/performed;Agitated Overall Cognitive Status: No family/caregiver present to determine baseline cognitive functioning Area of Impairment: Problem solving;Memory;Following commands                     Memory: Decreased short-term memory Following Commands: Follows one step commands inconsistently;Follows one step commands with increased time     Problem Solving: Slow processing;Difficulty sequencing;Requires verbal cues;Requires tactile cues;Decreased initiation General Comments: pt difficult to rouse, answers questions inconsistently, initiates movment fith max verbal and tactile cuing and then stops as though he's forgotten what he is doing              Pertinent Vitals/Pain Pain Assessment: Faces Faces Pain Scale: Hurts even more Pain Location: Rt abdomen Pain Descriptors / Indicators: Grimacing;Guarding Pain Intervention(s): Limited activity within patient's tolerance;Monitored during session;Repositioned           PT  Goals (current goals can now be found in the care plan section) Acute Rehab PT Goals Patient Stated Goal: not sure PT Goal Formulation: With patient/family Time For Goal Achievement: 08/28/17 Potential to Achieve Goals: Fair Progress towards PT goals: Not progressing toward goals - comment(pt with limited ability to follow instruction and participa)    Frequency    Min 2X/week      PT Plan Current  plan remains appropriate       AM-PAC PT "6 Clicks" Daily Activity  Outcome Measure  Difficulty turning over in bed (including adjusting bedclothes, sheets and blankets)?: A Lot Difficulty moving from lying on back to sitting on the side of the bed? : Unable Difficulty sitting down on and standing up from a chair with arms (e.g., wheelchair, bedside commode, etc,.)?: Unable Help needed moving to and from a bed to chair (including a wheelchair)?: Total Help needed walking in hospital room?: Total Help needed climbing 3-5 steps with a railing? : Total 6 Click Score: 7    End of Session Equipment Utilized During Treatment: Gait belt Activity Tolerance: Patient tolerated treatment well;Patient limited by fatigue Patient left: in bed;with bed alarm set;with family/visitor present Nurse Communication: Mobility status PT Visit Diagnosis: Unsteadiness on feet (R26.81);History of falling (Z91.81);Other abnormalities of gait and mobility (R26.89);Difficulty in walking, not elsewhere classified (R26.2)     Time: 4540-9811 PT Time Calculation (min) (ACUTE ONLY): 26 min  Charges:  $Therapeutic Activity: 8-22 mins                    G Codes:       Nevan Creighton B. Migdalia Dk PT, DPT Acute Rehabilitation  [phone] Pager [phone]     [place] 08/18/2017, 1:19 PM

## 2017-08-18 NOTE — Progress Notes (Signed)
I spoke with K.Kirby regarding pt confusion and why we are unable to repeat MRI Liver. Per radiologists recommendations, exam does not need to be repeated unless pt is able to hold breath and follow commands.  Per RN pt is unable to follow commands. Pt had MRI Liver on 5/26.

## 2017-08-18 NOTE — Progress Notes (Signed)
CSW continuing to follow for discharge needs. SNFs waiting to see if patient is having cancer treatment plan before accepting and starting insurance authorization.   Gabriel Locus Kemper Hochman LCSW [phone]

## 2017-08-19 ENCOUNTER — Inpatient Hospital Stay (HOSPITAL_COMMUNITY): Payer: Medicare HMO

## 2017-08-19 LAB — COMPREHENSIVE METABOLIC PANEL
ALBUMIN: 2.3 g/dL — AB (ref 3.5–5.0)
ALK PHOS: 184 U/L — AB (ref 38–126)
ALT: 28 U/L (ref 17–63)
ANION GAP: 13 (ref 5–15)
AST: 82 U/L — ABNORMAL HIGH (ref 15–41)
BUN: 20 mg/dL (ref 6–20)
CHLORIDE: 104 mmol/L (ref 101–111)
CO2: 22 mmol/L (ref 22–32)
CREATININE: 1.43 mg/dL — AB (ref 0.61–1.24)
Calcium: 8.7 mg/dL — ABNORMAL LOW (ref 8.9–10.3)
GFR calc non Af Amer: 49 mL/min — ABNORMAL LOW (ref >60)
GFR, EST AFRICAN AMERICAN: 57 mL/min — AB (ref >60)
GLUCOSE: 82 mg/dL (ref 65–99)
Potassium: 4.5 mmol/L (ref 3.5–5.1)
SODIUM: 139 mmol/L (ref 135–145)
Total Bilirubin: 0.9 mg/dL (ref 0.3–1.2)
Total Protein: 5.9 g/dL — ABNORMAL LOW (ref 6.5–8.1)

## 2017-08-19 LAB — CBC
HCT: 31.1 % — ABNORMAL LOW (ref 39.0–52.0)
HEMOGLOBIN: 9.6 g/dL — AB (ref 13.0–17.0)
MCH: 26.5 pg (ref 26.0–34.0)
MCHC: 30.9 g/dL (ref 30.0–36.0)
MCV: 85.9 fL (ref 78.0–100.0)
PLATELETS: 579 10*3/uL — AB (ref 150–400)
RBC: 3.62 MIL/uL — AB (ref 4.22–5.81)
RDW: 14.3 % (ref 11.5–15.5)
WBC: 15 10*3/uL — ABNORMAL HIGH (ref 4.0–10.5)

## 2017-08-19 MED ORDER — OXYCODONE HCL 5 MG PO TABS
5.0000 mg | ORAL_TABLET | Freq: Four times a day (QID) | ORAL | Status: DC | PRN
  Administered 2017-08-21 – 2017-08-23 (×4): 5 mg via ORAL
  Filled 2017-08-19 (×4): qty 1

## 2017-08-19 MED ORDER — LORAZEPAM 2 MG/ML IJ SOLN: 2.0000 mg | Freq: Once | INTRAMUSCULAR | Status: DC

## 2017-08-19 MED ORDER — LORAZEPAM 2 MG/ML IJ SOLN
1.0000 mg | Freq: Once | INTRAMUSCULAR | Status: AC | PRN
  Administered 2017-08-19: 1 mg via INTRAVENOUS
  Filled 2017-08-19: qty 1

## 2017-08-19 MED ORDER — LORAZEPAM 2 MG/ML IJ SOLN
INTRAMUSCULAR | Status: AC
  Administered 2017-08-19: 2 mg
  Filled 2017-08-19: qty 1

## 2017-08-19 NOTE — Progress Notes (Signed)
SLP Cancellation Note  Patient Details Name: Gabriel Romero MRN: [id number] DOB: 01-Oct-1950   Cancelled treatment:  Attempted to see pt for swallowing revaluation to determine readiness for diet upgrade.  Pt continues to have decreased LOA and is NPO at present for repeat MRI scheduled for today.  SLP will reattempt as schedule permits.  [place] 08/19/2017, 8:52 AM

## 2017-08-19 NOTE — Progress Notes (Deleted)
CSW received request regarding letter for court appearance. CSW provided letter.   Percell Locus Zayne Draheim LCSW [phone]

## 2017-08-19 NOTE — Progress Notes (Addendum)
PROGRESS NOTE    ESKER DEVER  UUV:[id number] DOB: Sep 23, 1950 DOA: 08/10/2017 PCP: [facility]   Brief Narrative by Dr Rockne Menghini; "TYMOTHY CASS is an 67 y.o. 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  CT of the abdomen and pelvis showed multiple large liver masses consistent with metastatic disease  And AKI."  -Patient underwent liver biopsy 5-23. Pathology consistent with [facility].  -Dr Marin Olp consulted, recommended surgical evaluation Surgery consulted. Awaiting Dr Marlowe Aschoff  evaluation.   Assessment & Plan:  Hepatocellular carcinoma/new diagnosis/ large liver masses -Status post CT-guided liver biopsy 5/23 -CT chest unremarkable -AFP : 13,746.  -Dr Marin Olp consulted, recommended surgical evaluation to determine if patient would be considered a surgical candidate -CCS following, repeat MRI recommended due to motion artifact on prior study -for repeat MRI today  Fever; Leukocytosis.  chest x ray bibasilar atelectasis.  UA negative, urine culture 10, K insignificant growth.  Blood culture no growth to date., -has been on IV cefepime and Flagyl by my partner  -stop antibiotics and monitor -Could likely be related to underlying malignancy  Delirium/cognitive dysfunction per daughter patient has been confused since his stroke, but more confused this admission.  -ammonia mildly elevated. He was started on lactulose.  -Likely a component of hepatic encephalopathy, alcohol induced cognitive changes and hospital-induced delirium -cut down narcotics -infectious workup negative -Monitor  Seizure; on keppra continue.   HTN;  Continue to hold HCTZ and lisinopril due to AKI.   Acute renal failure superimposed on stage 2 chronic kidney disease ([place] Baseline creatinine appears to be around 1.2.  Cr peak to  2.  Holding HCTZ and lisinopril.  -improving, cut down IV fluids  Thrombocytosis; reactive.   Normocytic anemia;  Monitor. Hb stable at 9   Weight loss/underweight Body mass index is 26.55 kg/m. Evaluated by dietician, continue Ensure. 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  Hyponatremia; mild.   DVT prophylaxis: heparin.  Code Status: full code.  Family Communication: doctor regularly discussed with Daughter over phone, Will attempt to contact family, none at bedside.  Disposition Plan: to be determined, will likely need SNF  Consultants:   Dr Marin Olp.   IR>   CCS   Procedures:  Liver Biopsy by IR   Antimicrobials: none  Subjective: -wants to go home -Remains confused overnight has mittens on  Objective: Vitals:   08/18/17 2153 08/19/17 0500 08/19/17 0532 08/19/17 0630  BP: (!) 153/75  (!) 172/77 (!) 153/74  Pulse: 87  91   Resp: 18  20   Temp:   99.4 F (37.4 C)   TempSrc:   Axillary   SpO2:   100%   Weight:  77.6 kg (171 lb)    Height:        Intake/Output Summary (Last 24 hours) at 08/19/2017 1356 Last data filed at 08/19/2017 1300 Gross per 24 hour  Intake 2505 ml  Output 1800 ml  Net 705 ml   Filed Weights   08/17/17 0433 08/18/17 0500 08/19/17 0500  Weight: 77.2 kg (170 lb 3.1 oz) 77.3 kg (170 lb 8 oz) 77.6 kg (171 lb)    Examination:  Gen: awake, alert, confused, oriented to self and partially to place only HEENT: PERRLA, Neck supple, no JVD Lungs: scattered rhonchi CVS: S1-S2/regular rate rhythm Abd: soft, Non tender, non distended, BS present Extremities: No Cyanosis, Clubbing or edema Skin: no  new rashes Psychiatry: confused    Data Reviewed: I have personally reviewed following labs and imaging studies  CBC: Recent Labs  Lab 08/14/17 0530 08/15/17 1516 08/17/17 0741 08/18/17 0407 08/19/17 0437  WBC 11.3* 12.2* 15.4* 13.8* 15.0*  HGB 9.5* 10.3* 9.8* 9.4* 9.6*  HCT 30.9* 32.4* 31.2* 29.8* 31.1*  MCV 85.6 84.4 84.3 84.7 85.9    PLT 662* 756* 680* 600* 956*   Basic Metabolic Panel: Recent Labs  Lab 08/14/17 0530 08/15/17 1516 08/17/17 0741 08/18/17 0407 08/19/17 0437  NA 132* 135 136 137 139  K 4.2 4.8 4.5 4.2 4.5  CL 96* 99* 101 102 104  CO2 26 27 24 26 22   GLUCOSE 128* 107* 103* 105* 82  BUN 18 16 19  22* 20  CREATININE 1.63* 1.56* 1.79* 1.57* 1.43*  CALCIUM 8.8* 8.9 8.7* 8.6* 8.7*   GFR: Estimated Creatinine Clearance: 46.9 mL/min (A) (by C-G formula based on SCr of 1.43 mg/dL (H)). Liver Function Tests: Recent Labs  Lab 08/14/17 0530 08/19/17 0437  AST 62* 82*  ALT 26 28  ALKPHOS 225* 184*  BILITOT 0.5 0.9  PROT 5.8* 5.9*  ALBUMIN 2.4* 2.3*   No results for input(s): LIPASE, AMYLASE in the last 168 hours. Recent Labs  Lab 08/12/17 1428 08/15/17 1516  AMMONIA 36* 36*   Coagulation Profile: No results for input(s): INR, PROTIME in the last 168 hours. Cardiac Enzymes: No results for input(s): CKTOTAL, CKMB, CKMBINDEX, TROPONINI in the last 168 hours. BNP (last 3 results) No results for input(s): PROBNP in the last 8760 hours. HbA1C: No results for input(s): HGBA1C in the last 72 hours. CBG: No results for input(s): GLUCAP in the last 168 hours. Lipid Profile: No results for input(s): CHOL, HDL, LDLCALC, TRIG, CHOLHDL, LDLDIRECT in the last 72 hours. Thyroid Function Tests: No results for input(s): TSH, T4TOTAL, FREET4, T3FREE, THYROIDAB in the last 72 hours. Anemia Panel: No results for input(s): VITAMINB12, FOLATE, FERRITIN, TIBC, IRON, RETICCTPCT in the last 72 hours. Sepsis Labs: No results for input(s): PROCALCITON, LATICACIDVEN in the last 168 hours.  Recent Results (from the past 240 hour(s))  Urine Culture     Status: Abnormal   Collection Time: 08/15/17  1:58 PM  Result Value Ref Range Status   Specimen Description URINE, CLEAN CATCH  Final   Special Requests NONE  Final   Culture (A)  Final    <10,000 COLONIES/mL INSIGNIFICANT GROWTH Performed at [facility], [address] [place], [place] [address]    Report Status 08/16/2017 FINAL  Final  Culture, blood (routine x 2)     Status: None (Preliminary result)   Collection Time: 08/15/17  3:16 PM  Result Value Ref Range Status   Specimen Description BLOOD LEFT HAND  Final   Special Requests   Final    BOTTLES DRAWN AEROBIC AND ANAEROBIC Blood Culture adequate volume   Culture   Final    NO GROWTH 3 DAYS Performed at [facility], [place] [address], [place], [place] [address]    Report Status PENDING  Incomplete  Culture, blood (routine x 2)     Status: None (Preliminary result)   Collection Time: 08/15/17  3:16 PM  Result Value Ref Range Status   Specimen Description BLOOD LEFT HAND  Final   Special Requests   Final    BOTTLES DRAWN AEROBIC AND ANAEROBIC Blood Culture adequate volume   Culture   Final    NO GROWTH 3 DAYS Performed at [facility], [place] [address] [place], [place] [address]    Report Status PENDING  Incomplete         Radiology Studies: No results found.      Scheduled Meds: . feeding supplement (ENSURE ENLIVE)  237 mL Oral TID BM  . heparin  5,000 Units Subcutaneous Q8H  . lactulose  30 g Oral TID  . levETIRAcetam  500 mg Oral BID  . LORazepam  2 mg Intravenous Once  . mouth rinse  15 mL Mouth Rinse BID  . oxyCODONE  20 mg Oral Q12H   Continuous Infusions: . sodium chloride 10 mL/hr at 08/19/17 0752  . ceFEPime (MAXIPIME) IV Stopped (08/18/17 1628)  . metronidazole Stopped (08/19/17 0658)     LOS: 8 days    Time spent: 35 minutes.     Domenic Polite, MD [facility] Page via [email or web address] password [email or web address]  If 7PM-7AM, please contact night-coverage  08/19/2017, 1:56 PM

## 2017-08-19 NOTE — Progress Notes (Addendum)
Nutrition Follow-up  DOCUMENTATION CODES:   Not applicable  INTERVENTION:  Continue Ensure Enlive po TID, each supplement provides 350 kcal and 20 grams of protein  Continue Magic cup TID with meals, each supplement provides 290 kcal and 9 grams of protein  NUTRITION DIAGNOSIS:   Increased nutrient needs related to chronic illness as evidenced by estimated needs. -ongoing  GOAL:   Patient will meet greater than or equal to 90% of their needs -unmet  MONITOR:   PO intake, Supplement acceptance, I & O's, Labs  ASSESSMENT:   Gabriel Romero is a 67 y.o. male with past medical history of alcohol abuse, HTN, CVA who presented to [facility] ED with severe abdominal pain. CT of abdomen found liver lesions most consistent with metastatic disease, biopsy scheduled, AKI on CKD II  Patient was headed to MRI during visit. Oncology evaluated patient, found with [place], recommended surgical eval for possible partial hepatectomy, awaiting results of repeat MRI.  Per discussion with RN he ate 25% for breakfast. Was made NPO for MRI following that meal.  He was refusing meals yesterday, only eating pudding. Will follow-up and try to cater to patient preferences. Poor PO intake may be related to confusion, appears confusion began last year following CVA/seizure.  Monitor for needs  Labs reviewed:  AST 82  Medications reviewed and include:  Lactulose NS at 4mL/hr  Diet Order:   Diet Order           DIET - DYS 1 Room service appropriate? Yes; Fluid consistency: Thin  Diet effective now          EDUCATION NEEDS:   Not appropriate for education at this time  Skin:  Skin Assessment: Reviewed RN Assessment  Last BM:  08/19/2017  Height:   Ht Readings from Last 1 Encounters:  08/11/17 5\' 7"  (1.702 m)    Weight:   Wt Readings from Last 1 Encounters:  08/19/17 171 lb (77.6 kg)    Ideal Body Weight:  67.27 kg  BMI:  Body mass index is 26.78 kg/m.  Estimated Nutritional Needs:    Kcal:  1700-2000 calories (30-35 cal/kg)  Protein:  85-97 grams (1.5-1.7g/kg)  Fluid:  1.7-2.0L  Satira Anis. Jurney Overacker, MS, RD LDN Inpatient Clinical Dietitian Pager [phone]

## 2017-08-19 NOTE — Progress Notes (Signed)
Patient on MRI. Before he went to MRI he received Ativan 2 mg IV. MRI staff called Probation officer and asked to go downstairs to give the PRN Ativan 1 mg. At 14:22 o'clock patient received PRN dose of Ativan 1 mg. Will continue to monitor.

## 2017-08-20 LAB — COMPREHENSIVE METABOLIC PANEL
ALT: 28 U/L (ref 17–63)
AST: 80 U/L — AB (ref 15–41)
Albumin: 2.5 g/dL — ABNORMAL LOW (ref 3.5–5.0)
Alkaline Phosphatase: 203 U/L — ABNORMAL HIGH (ref 38–126)
Anion gap: 9 (ref 5–15)
BILIRUBIN TOTAL: 0.8 mg/dL (ref 0.3–1.2)
BUN: 23 mg/dL — AB (ref 6–20)
CO2: 24 mmol/L (ref 22–32)
Calcium: 8.6 mg/dL — ABNORMAL LOW (ref 8.9–10.3)
Chloride: 104 mmol/L (ref 101–111)
Creatinine, Ser: 1.49 mg/dL — ABNORMAL HIGH (ref 0.61–1.24)
GFR calc Af Amer: 54 mL/min — ABNORMAL LOW (ref >60)
GFR calc non Af Amer: 47 mL/min — ABNORMAL LOW (ref >60)
GLUCOSE: 103 mg/dL — AB (ref 65–99)
POTASSIUM: 4 mmol/L (ref 3.5–5.1)
SODIUM: 137 mmol/L (ref 135–145)
TOTAL PROTEIN: 6.1 g/dL — AB (ref 6.5–8.1)

## 2017-08-20 LAB — CULTURE, BLOOD (ROUTINE X 2)
CULTURE: NO GROWTH
Culture: NO GROWTH
Special Requests: ADEQUATE
Special Requests: ADEQUATE

## 2017-08-20 LAB — CBC
HEMATOCRIT: 30.5 % — AB (ref 39.0–52.0)
HEMOGLOBIN: 9.7 g/dL — AB (ref 13.0–17.0)
MCH: 27 pg (ref 26.0–34.0)
MCHC: 31.8 g/dL (ref 30.0–36.0)
MCV: 85 fL (ref 78.0–100.0)
Platelets: 706 10*3/uL — ABNORMAL HIGH (ref 150–400)
RBC: 3.59 MIL/uL — ABNORMAL LOW (ref 4.22–5.81)
RDW: 14.5 % (ref 11.5–15.5)
WBC: 16 10*3/uL — AB (ref 4.0–10.5)

## 2017-08-20 NOTE — Progress Notes (Signed)
Physical Therapy Treatment Patient Details Name: Gabriel Romero: [id number] DOB: [birth year] Today's Date: 08/20/2017    History of Present Illness Gabriel Romero is an 67 y.o. 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  CT of the abdomen and pelvis showed multiple large liver masses consistent with metastatic disease from an unknown primary.  Also found to have AKI. Found to be primary liver cancer. No metastases noted on CT scan.    PT Comments    Pt more alert today, however found lying naked in bed with mitts off and IV pulled out. Pt requires max repetitive verbal and tactile cuing, with repetition of short commands for pt to follow directions. Pt able to come to EoB with min guard A , requires modAx2 for sit>stand transfers and pivot step transfers with RW. Pt with increased agitation with increased assist to steady, however apologized once seated. Pt requires constant supervision and redirection to follow commands and as such will require SNF placement at d/c.     Follow Up Recommendations  SNF;Supervision/Assistance - 24 hour     Equipment Recommendations  None recommended by PT(at this time.)    Recommendations for Other Services       Precautions / Restrictions Precautions Precautions: Fall Restrictions Weight Bearing Restrictions: No    Mobility  Bed Mobility Overal bed mobility: Needs Assistance Bed Mobility: Supine to Sit;Sit to Supine     Supine to sit: HOB elevated;Min guard Sit to supine: HOB elevated;Max assist   General bed mobility comments: min guard for pt to come to side of bed with max verbal and gentle tactile cuing  Transfers Overall transfer level: Needs assistance Equipment used: Rolling walker (2 wheeled) Transfers: Sit to/from Stand Sit to Stand: From elevated surface;Mod  assist;+2 physical assistance Stand pivot transfers: Mod assist;+2 physical assistance;From elevated surface       General transfer comment: modA x2 for sit>stand transfer to RW, requires max short direct verbal cuing and placing hands on RW, once he processed information able to power up with modAx2 and required mod A to steady in walker as NT performed pericare, again with max cuing able to stand step transfer to straightbacked chair long enough for bed to be made, required modAx2 for transfer back to bed   Ambulation/Gait             General Gait Details: unable to attempt          Balance Overall balance assessment: Needs assistance Sitting-balance support: Bilateral upper extremity supported;Feet supported Sitting balance-Leahy Scale: Fair                                      Cognition Arousal/Alertness: Awake/alert Behavior During Therapy: Impulsive;Restless Overall Cognitive Status: No family/caregiver present to determine baseline cognitive functioning Area of Impairment: Problem solving;Memory;Following commands                     Memory: Decreased short-term memory Following Commands: Follows one step commands inconsistently;Follows one step commands with increased time     Problem Solving: Slow processing;Difficulty sequencing;Requires verbal cues;Requires tactile cues;Decreased initiation General Comments: pt found naked in bed with mitts removed and IV pulled out and was working to remove condom catheter, pt was able to dedirect to getting  out of bed so it could be changed as it was wet         General Comments General comments (skin integrity, edema, etc.): VSS      Pertinent Vitals/Pain Pain Assessment: Faces Faces Pain Scale: Hurts little more Pain Location: Rt abdomen Pain Descriptors / Indicators: Grimacing;Guarding           PT Goals (current goals can now be found in the care plan section) Acute Rehab PT  Goals Patient Stated Goal: not sure PT Goal Formulation: With patient/family Time For Goal Achievement: 08/28/17 Potential to Achieve Goals: Fair Progress towards PT goals: Progressing toward goals    Frequency    Min 2X/week      PT Plan Current plan remains appropriate       AM-PAC PT "6 Clicks" Daily Activity  Outcome Measure  Difficulty turning over in bed (including adjusting bedclothes, sheets and blankets)?: A Lot Difficulty moving from lying on back to sitting on the side of the bed? : Unable Difficulty sitting down on and standing up from a chair with arms (e.g., wheelchair, bedside commode, etc,.)?: Unable Help needed moving to and from a bed to chair (including a wheelchair)?: Total Help needed walking in hospital room?: Total Help needed climbing 3-5 steps with a railing? : Total 6 Click Score: 7    End of Session Equipment Utilized During Treatment: Gait belt Activity Tolerance: Patient tolerated treatment well;Patient limited by fatigue Patient left: in bed;with bed alarm set;with family/visitor present Nurse Communication: Mobility status(IV pulled out) PT Visit Diagnosis: Unsteadiness on feet (R26.81);History of falling (Z91.81);Other abnormalities of gait and mobility (R26.89);Difficulty in walking, not elsewhere classified (R26.2)     Time: 8938-1017 PT Time Calculation (min) (ACUTE ONLY): 25 min  Charges:  $Therapeutic Activity: 23-37 mins                    G Codes:       Shawna Wearing B. Migdalia Dk PT, DPT Acute Rehabilitation  [phone] Pager [phone]     [place] 08/20/2017, 4:16 PM

## 2017-08-20 NOTE — Progress Notes (Signed)
PROGRESS NOTE    Gabriel Romero  MOQ:[id number] DOB: 09-06-1950 DOA: 08/10/2017 PCP: [facility]   Brief Narrative by Dr Rockne Menghini; "Gabriel Romero is an 67 y.o. 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  CT of the abdomen and pelvis showed multiple large liver masses consistent with metastatic disease  And AKI."  -Patient underwent liver biopsy 5-23. Pathology consistent with [facility].  -Dr Marin Olp consulted, recommended surgical evaluation Surgery consulted. Awaiting Dr Marlowe Aschoff  evaluation.   Assessment & Plan:  Hepatocellular carcinoma/new diagnosis/ large liver masses -Status post CT-guided liver biopsy 5/23 -CT chest unremarkable -AFP : 13,746.  -Dr Marin Olp consulted, recommended surgical evaluation to determine if patient would be considered a surgical candidate -CCS following, repeat MRI recommended due to motion artifact on prior study -s/p another failed attempt despite multiple ativan doses -will d/w Radiology if Anesthesia is an option  Fever; Leukocytosis.  chest x ray bibasilar atelectasis.  UA negative, urine culture 10, K insignificant growth.  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 He was started on lactulose.  -cut down narcotics -infectious workup negative -unable to make decisions on his behalf, daughter has been handling and managing finances for 1year now  Seizure d/o -on keppra continue.   HTN;  Continue to hold HCTZ and lisinopril due to AKI.   Acute renal failure superimposed on stage 2 chronic kidney disease ([place] Baseline creatinine  appears to be around 1.2.  Cr peak to 2.  Holding HCTZ and lisinopril.  -now stable at 1.49  Thrombocytosis; reactive.   Normocytic anemia;  Monitor. Hb stable at 9   Weight loss/underweight Body mass index is 26.55 kg/m. Evaluated by dietician, continue Ensure. 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  Hyponatremia; mild.   DVT prophylaxis: heparin.  Code Status: full code.  Family Communication: doctor regularly discussed with Daughter over phone Disposition Plan: SNF when able  Consultants:   Dr Marin Olp.   IR>   CCS   Procedures:  Liver Biopsy by IR   Antimicrobials: none  Subjective: -wasn't able to get MRI done  Objective: Vitals:   08/20/17 0448 08/20/17 0455 08/20/17 0611 08/20/17 1246  BP: (!) 83/52 123/70  134/70  Pulse: 79   74  Resp: 18   20  Temp: 99.6 F (37.6 C)   98.4 F (36.9 C)  TempSrc:    Axillary  SpO2: 93%   100%  Weight:   77.6 kg (171 lb)   Height:        Intake/Output Summary (Last 24 hours) at 08/20/2017 1330 Last data filed at 08/20/2017 0956 Gross per 24 hour  Intake 548.33 ml  Output 640 ml  Net -91.67 ml   Filed Weights   08/18/17 0500 08/19/17 0500 08/20/17 0611  Weight: 77.3 kg (170 lb 8 oz) 77.6 kg (171 lb) 77.6 kg (171 lb)    Examination:  Gen: Awake, Alert, Oriented X  To self and only partly to place HEENT: PERRLA, Neck supple, no JVD Lungs: scattered ronchi CVS: RRR,No Gallops,Rubs or new Murmurs Abd: soft, Non tender, non distended, BS present Extremities: No Cyanosis, Clubbing or edema Skin: no new rashes Psychiatry: confused  Data Reviewed: I have personally reviewed following labs and imaging studies  CBC: Recent Labs  Lab 08/15/17 1516 08/17/17 0741 08/18/17 0407 08/19/17 0437 08/20/17 0647  WBC 12.2* 15.4* 13.8* 15.0* 16.0*  HGB 10.3* 9.8* 9.4* 9.6* 9.7*  HCT 32.4* 31.2* 29.8* 31.1* 30.5*  MCV 84.4 84.3 84.7 85.9 85.0  PLT 756* 680* 600* 579* 720*   Basic Metabolic Panel: Recent Labs    Lab 08/15/17 1516 08/17/17 0741 08/18/17 0407 08/19/17 0437 08/20/17 0647  NA 135 136 137 139 137  K 4.8 4.5 4.2 4.5 4.0  CL 99* 101 102 104 104  CO2 27 24 26 22 24   GLUCOSE 107* 103* 105* 82 103*  BUN 16 19 22* 20 23*  CREATININE 1.56* 1.79* 1.57* 1.43* 1.49*  CALCIUM 8.9 8.7* 8.6* 8.7* 8.6*   GFR: Estimated Creatinine Clearance: 45 mL/min (A) (by C-G formula based on SCr of 1.49 mg/dL (H)). Liver Function Tests: Recent Labs  Lab 08/14/17 0530 08/19/17 0437 08/20/17 0647  AST 62* 82* 80*  ALT 26 28 28   ALKPHOS 225* 184* 203*  BILITOT 0.5 0.9 0.8  PROT 5.8* 5.9* 6.1*  ALBUMIN 2.4* 2.3* 2.5*   No results for input(s): LIPASE, AMYLASE in the last 168 hours. Recent Labs  Lab 08/15/17 1516  AMMONIA 36*   Coagulation Profile: No results for input(s): INR, PROTIME in the last 168 hours. Cardiac Enzymes: No results for input(s): CKTOTAL, CKMB, CKMBINDEX, TROPONINI in the last 168 hours. BNP (last 3 results) No results for input(s): PROBNP in the last 8760 hours. HbA1C: No results for input(s): HGBA1C in the last 72 hours. CBG: No results for input(s): GLUCAP in the last 168 hours. Lipid Profile: No results for input(s): CHOL, HDL, LDLCALC, TRIG, CHOLHDL, LDLDIRECT in the last 72 hours. Thyroid Function Tests: No results for input(s): TSH, T4TOTAL, FREET4, T3FREE, THYROIDAB in the last 72 hours. Anemia Panel: No results for input(s): VITAMINB12, FOLATE, FERRITIN, TIBC, IRON, RETICCTPCT in the last 72 hours. Sepsis Labs: No results for input(s): PROCALCITON, LATICACIDVEN in the last 168 hours.  Recent Results (from the past 240 hour(s))  Urine Culture     Status: Abnormal   Collection Time: 08/15/17  1:58 PM  Result Value Ref Range Status   Specimen Description URINE, CLEAN CATCH  Final   Special Requests NONE  Final   Culture (A)  Final    <10,000 COLONIES/mL INSIGNIFICANT GROWTH Performed at [facility], [address], [place], [place] [address]     Report Status 08/16/2017 FINAL  Final  Culture, blood (routine x 2)     Status: None (Preliminary result)   Collection Time: 08/15/17  3:16 PM  Result Value Ref Range Status   Specimen Description BLOOD LEFT HAND  Final   Special Requests   Final    BOTTLES DRAWN AEROBIC AND ANAEROBIC Blood Culture adequate volume   Culture   Final    NO GROWTH 4 DAYS Performed at [facility], [place] [address], [place], [place] [address]    Report Status PENDING  Incomplete  Culture, blood (routine x 2)     Status: None (Preliminary result)   Collection Time: 08/15/17  3:16 PM  Result Value Ref Range Status   Specimen Description BLOOD LEFT HAND  Final   Special Requests   Final    BOTTLES DRAWN AEROBIC AND ANAEROBIC Blood Culture adequate volume   Culture   Final    NO GROWTH 4 DAYS Performed at [facility]  Lab, [address] [place], [place] [address]    Report Status PENDING  Incomplete         Radiology Studies: Mr Liver Wo Conrtast  Result Date: 08/19/2017 CLINICAL DATA:  Biopsy proven multicentric hepatocellular carcinoma. Surgical planning. EXAM: MRI ABDOMEN WITHOUT CONTRAST (LIMITED) TECHNIQUE: Multiplanar multisequence MR imaging was attempted without the administration of intravenous contrast. Only coronal and axial HASTE and axial T2 weighted images were obtained due to the patient's condition. The study could not be completed. COMPARISON:  MRI 08/16/2017.  CT 03/04/2008 and 08/10/2017. FINDINGS: Lower chest: As above, the study is incomplete and is motion degraded. There is atelectasis at both lung bases. Hepatobiliary: Multiple large masses are again noted within the right hepatic lobe. Again, no definite involvement of the left hepatic lobe. The middle hepatic vein remains suboptimally visualized, although appears patent. Pancreas:  Appear stable without significant findings. Spleen:  Normal in size without focal abnormality. Adrenals/Urinary Tract: Stable 3.5 x 3.4 cm left  adrenal nodule, benign based on previous characterization and stability. The right adrenal gland appears normal. Small renal cysts bilaterally. Stomach/Bowel: Unremarkable. Vascular/Lymphatic: No enlarged abdominal lymph nodes. Aortic atherosclerosis. Other:  Mild mesenteric and retroperitoneal edema.  No ascites. Musculoskeletal: No acute or significant osseous findings. Mild spondylosis. IMPRESSION: 1. Attempted repeat examination for surgical planning remains limited and is incomplete. Only 3 sequences were obtained. 2. 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 No definite involvement of the left lobe. Electronically Signed   By: Richardean Sale M.D.   On: 08/19/2017 16:20        Scheduled Meds: . feeding supplement (ENSURE ENLIVE)  237 mL Oral TID BM  . heparin  5,000 Units Subcutaneous Q8H  . lactulose  30 g Oral TID  . levETIRAcetam  500 mg Oral BID  . LORazepam  2 mg Intravenous Once  . mouth rinse  15 mL Mouth Rinse BID  . oxyCODONE  20 mg Oral Q12H   Continuous Infusions: . sodium chloride 10 mL/hr at 08/19/17 0752     LOS: 9 days    Time spent: 35 minutes.     Domenic Polite, MD [facility] Page via [email or web address] password [email or web address]  If 7PM-7AM, please contact night-coverage  08/20/2017, 1:30 PM

## 2017-08-20 NOTE — Progress Notes (Signed)
Subjective/Chief Complaint: Remains confused   Objective: Vital signs in last 24 hours: Temp:  [98.1 F (36.7 C)-99.6 F (37.6 C)] 99.6 F (37.6 C) (05/30 0448) Pulse Rate:  [79-89] 79 (05/30 0448) Resp:  [18-20] 18 (05/30 0448) BP: (83-151)/(52-74) 123/70 (05/30 0455) SpO2:  [93 %-100 %] 93 % (05/30 0448) Weight:  [77.6 kg (171 lb)] 77.6 kg (171 lb) (05/30 0611) Last BM Date: 08/19/17  Intake/Output from previous day: 05/29 0701 - 05/30 0700 In: 311.3 [P.O.:240; I.V.:71.3] Out: 740 [Urine:740] Intake/Output this shift: Total I/O In: -  Out: 400 [Urine:400]  Exam: Awake and alert, confused Abdomen soft, NT  Lab Results:  Recent Labs    08/19/17 0437 08/20/17 0647  WBC 15.0* 16.0*  HGB 9.6* 9.7*  HCT 31.1* 30.5*  PLT 579* 706*   BMET Recent Labs    08/19/17 0437 08/20/17 0647  NA 139 137  K 4.5 4.0  CL 104 104  CO2 22 24  GLUCOSE 82 103*  BUN 20 23*  CREATININE 1.43* 1.49*  CALCIUM 8.7* 8.6*   PT/INR No results for input(s): LABPROT, INR in the last 72 hours. ABG No results for input(s): PHART, HCO3 in the last 72 hours.  Invalid input(s): PCO2, PO2  Studies/Results: Mr Liver Wo Conrtast  Result Date: 08/19/2017 CLINICAL DATA:  Biopsy proven multicentric hepatocellular carcinoma. Surgical planning. EXAM: MRI ABDOMEN WITHOUT CONTRAST (LIMITED) TECHNIQUE: Multiplanar multisequence MR imaging was attempted without the administration of intravenous contrast. Only coronal and axial HASTE and axial T2 weighted images were obtained due to the patient's condition. The study could not be completed. COMPARISON:  MRI 08/16/2017.  CT 03/04/2008 and 08/10/2017. FINDINGS: Lower chest: As above, the study is incomplete and is motion degraded. There is atelectasis at both lung bases. Hepatobiliary: Multiple large masses are again noted within the right hepatic lobe. Again, no definite involvement of the left hepatic lobe. The middle hepatic vein remains  suboptimally visualized, although appears patent. Pancreas:  Appear stable without significant findings. Spleen:  Normal in size without focal abnormality. Adrenals/Urinary Tract: Stable 3.5 x 3.4 cm left adrenal nodule, benign based on previous characterization and stability. The right adrenal gland appears normal. Small renal cysts bilaterally. Stomach/Bowel: Unremarkable. Vascular/Lymphatic: No enlarged abdominal lymph nodes. Aortic atherosclerosis. Other:  Mild mesenteric and retroperitoneal edema.  No ascites. Musculoskeletal: No acute or significant osseous findings. Mild spondylosis. IMPRESSION: 1. Attempted repeat examination for surgical planning remains limited and is incomplete. Only 3 sequences were obtained. 2. 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 No definite involvement of the left lobe. Electronically Signed   By: Richardean Sale M.D.   On: 08/19/2017 16:20    Anti-infectives: Anti-infectives (From admission, onward)   Start     Dose/Rate Route Frequency Ordered Stop   08/17/17 1400  metroNIDAZOLE (FLAGYL) IVPB 500 mg  Status:  Discontinued     500 mg 100 mL/hr over 60 Minutes Intravenous Every 8 hours 08/17/17 1315 08/19/17 1410   08/15/17 1615  ceFEPIme (MAXIPIME) 2 g in sodium chloride 0.9 % 100 mL IVPB  Status:  Discontinued     2 g 200 mL/hr over 30 Minutes Intravenous Every 24 hours 08/15/17 1607 08/19/17 1410      Assessment/Plan: Intracranial hemorrhage Hx of seizure disorder Hx of ETOH abuse - abstinent x 1 year Hypertension CKD stage II  Right hepatic mass  - US guided biopsy right levery mass 08/12/17  - Liver, needle/core biopsy:- CARCINOMA  Second MRI yesterday  again limited and incomplete.  Will defer to Dr. Barry Dienes as to what may need to be the next step.  (?MRI under general anesthesia, ?transfer to tertiary care center)   LOS: 9 days    Gabriel Romero A 08/20/2017

## 2017-08-21 ENCOUNTER — Encounter (HOSPITAL_COMMUNITY): Payer: Self-pay | Admitting: Anesthesiology

## 2017-08-21 ENCOUNTER — Encounter (HOSPITAL_COMMUNITY)
Admission: EM | Disposition: A | Discharge status: Skilled Nursing Facility | Payer: Self-pay | Source: Home / Self Care | Attending: Internal Medicine

## 2017-08-21 ENCOUNTER — Inpatient Hospital Stay (HOSPITAL_COMMUNITY): Payer: Medicare HMO

## 2017-08-21 ENCOUNTER — Inpatient Hospital Stay (HOSPITAL_COMMUNITY): Payer: Medicare HMO | Admitting: Anesthesiology

## 2017-08-21 LAB — BASIC METABOLIC PANEL
ANION GAP: 8 (ref 5–15)
BUN: 25 mg/dL — ABNORMAL HIGH (ref 6–20)
CALCIUM: 8.7 mg/dL — AB (ref 8.9–10.3)
CO2: 27 mmol/L (ref 22–32)
Chloride: 105 mmol/L (ref 101–111)
Creatinine, Ser: 1.33 mg/dL — ABNORMAL HIGH (ref 0.61–1.24)
GFR, EST NON AFRICAN AMERICAN: 54 mL/min — AB (ref >60)
GLUCOSE: 111 mg/dL — AB (ref 65–99)
POTASSIUM: 4 mmol/L (ref 3.5–5.1)
SODIUM: 140 mmol/L (ref 135–145)

## 2017-08-21 LAB — CBC
HCT: 31.7 % — ABNORMAL LOW (ref 39.0–52.0)
Hemoglobin: 9.7 g/dL — ABNORMAL LOW (ref 13.0–17.0)
MCH: 26.1 pg (ref 26.0–34.0)
MCHC: 30.6 g/dL (ref 30.0–36.0)
MCV: 85.4 fL (ref 78.0–100.0)
PLATELETS: 675 10*3/uL — AB (ref 150–400)
RBC: 3.71 MIL/uL — AB (ref 4.22–5.81)
RDW: 14.6 % (ref 11.5–15.5)
WBC: 16.5 10*3/uL — AB (ref 4.0–10.5)

## 2017-08-21 SURGERY — MRI WITH ANESTHESIA
Anesthesia: General

## 2017-08-21 MED ORDER — GADOBENATE DIMEGLUMINE 529 MG/ML IV SOLN
20.0000 mL | Freq: Once | INTRAVENOUS | Status: AC
  Administered 2017-08-21: 18 mL via INTRAVENOUS

## 2017-08-21 MED ORDER — ASPIRIN 81 MG PO CHEW
81.0000 mg | CHEWABLE_TABLET | Freq: Every day | ORAL | Status: DC
  Administered 2017-08-21 – 2017-08-24 (×4): 81 mg via ORAL
  Filled 2017-08-21 (×4): qty 1

## 2017-08-21 MED ORDER — AMLODIPINE BESYLATE 5 MG PO TABS
5.0000 mg | ORAL_TABLET | Freq: Every day | ORAL | Status: DC
  Administered 2017-08-21 – 2017-08-24 (×4): 5 mg via ORAL
  Filled 2017-08-21 (×4): qty 1

## 2017-08-21 NOTE — Progress Notes (Signed)
Dr. Royce Macadamia aware that patient had applesauce with meds this morning at [phone]

## 2017-08-21 NOTE — Anesthesia Preprocedure Evaluation (Deleted)
Anesthesia Evaluation  Patient identified by MRN, date of birth, ID band Patient awake and Patient confused    Reviewed: Allergy & Precautions, NPO status , Patient's Chart, lab work & pertinent test results  Airway Mallampati: III  TM Distance: >3 FB Neck ROM: Full    Dental  (+) Missing, Poor Dentition, Chipped, Loose   Pulmonary Current Smoker,    Pulmonary exam normal breath sounds clear to auscultation       Cardiovascular hypertension, Pt. on medications  Rhythm:Regular Rate:Normal     Neuro/Psych Seizures -, Well Controlled,  PSYCHIATRIC DISORDERS Hx/o intracranial hemorrhage CVA    GI/Hepatic GERD  Medicated and Controlled,(+)     substance abuse  alcohol use, Liver metastasis ETOH abuse in remission for 1 year Abdominal pain    Endo/Other  negative endocrine ROSHyperlipidemia  Renal/GU Renal InsufficiencyRenal disease  negative genitourinary   Musculoskeletal negative musculoskeletal ROS (+)   Abdominal   Peds  Hematology  (+) anemia , Thrombocytosis   Anesthesia Other Findings   Reproductive/Obstetrics                          Anesthesia Physical Anesthesia Plan  ASA: III  Anesthesia Plan: General   Post-op Pain Management:    Induction: Intravenous, Cricoid pressure planned and Rapid sequence  PONV Risk Score and Plan: 1 and Ondansetron, Treatment may vary due to age or medical condition and Propofol infusion  Airway Management Planned: Oral ETT  Additional Equipment:   Intra-op Plan:   Post-operative Plan: Extubation in OR  Informed Consent: I have reviewed the patients History and Physical, chart, labs and discussed the procedure including the risks, benefits and alternatives for the proposed anesthesia with the patient or authorized representative who has indicated his/her understanding and acceptance.   Dental advisory given  Plan Discussed with: CRNA,  Anesthesiologist and Surgeon  Anesthesia Plan Comments:        Anesthesia Quick Evaluation

## 2017-08-21 NOTE — H&P (Signed)
Anesthesia H&P Update: History and Physical Exam reviewed; patient is OK for planned anesthetic and procedure. ? ?

## 2017-08-21 NOTE — Progress Notes (Signed)
PROGRESS NOTE    Gabriel Romero  YWV:[id number] DOB: 1950-08-06 DOA: 08/10/2017 PCP: [facility]   Brief Narrative by Dr Rockne Menghini; "Gabriel Romero is an 67 y.o. 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  CT of the abdomen and pelvis showed multiple large liver masses consistent with metastatic disease  And AKI."  -Patient underwent liver biopsy 5-23. Pathology consistent with [facility].  -Dr Marin Olp consulted, recommended surgical evaluation  Assessment & Plan:  Hepatocellular carcinoma/new diagnosis/ large liver masses -Status post CT-guided liver biopsy 5/23 -CT chest unremarkable -AFP : 13,746.  -Dr Marin Olp consulted, recommended surgical evaluation to determine if patient would be considered a surgical candidate -CCS following, repeat MRI recommended due to motion artifact on prior study -s/p another failed attempt despite multiple ativan doses -finally for MRI under anesthesia today, will d/w Dr.Byerly-goal will be to have an outpatient consultation and IR referral if not surgical candidate  Fever; Leukocytosis.  chest x ray bibasilar atelectasis.  UA negative, urine culture 10, K insignificant growth.  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 He was started on lactulose.  -cut down narcotics -infectious workup negative -unable to make decisions on his behalf, daughter has been handling and managing finances for 1year now  Seizure d/o -on keppra continue.   HTN;  Continue to hold HCTZ and lisinopril due to AKI.   Acute renal failure superimposed on  stage 2 chronic kidney disease ([place] Baseline creatinine appears to be around 1.2.  Cr peak to 2.  Holding HCTZ and lisinopril.  -now stable at 1.49  Thrombocytosis; reactive.   Normocytic anemia;  Monitor. Hb stable at 9   Weight loss/underweight Body mass index is 26.55 kg/m. Evaluated by dietician, continue Ensure. 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  Hyponatremia; mild.   DVT prophylaxis: heparin.  Code Status: full code.  Family Communication: called and updated daughter [name] 5/30 Disposition Plan: SNF when able, less agitated  Consultants:   Dr Marin Olp.   IR>   CCS   Procedures:  Liver Biopsy by IR   Antimicrobials: none  Subjective: Going down for MRI this am -confusion   Objective: Vitals:   08/21/17 0647 08/21/17 1209 08/21/17 1225 08/21/17 1236  BP: (!) 157/89 127/75 116/65 114/62  Pulse: (!) 114 76 78 75  Resp: 20 15 14 15   Temp: 98.2 F (36.8 C) 98.4 F (36.9 C)  98.4 F (36.9 C)  TempSrc: Oral     SpO2: 95% 100% 98% 96%  Weight:      Height:        Intake/Output Summary (Last 24 hours) at 08/21/2017 1418 Last data filed at 08/21/2017 0656 Gross per 24 hour  Intake -  Output 820 ml  Net -820 ml   Filed Weights   08/18/17 0500 08/19/17 0500 08/20/17 0611  Weight: 77.3 kg (170 lb 8 oz) 77.6 kg (171 lb) 77.6 kg (171 lb)    Examination:  Gen: Awake, Alert, confused,oriented to self and partly to place only  HEENT: PERRLA, Neck supple, no JVD Lungs: scattered ronchi CVS: RRR,No Gallops,Rubs or new Murmurs Abd: soft, Non tender, non distended, BS present Extremities: No Cyanosis,  Clubbing or edema Skin: no new rashes Psychiatry: confused    Data Reviewed: I have personally reviewed following labs and imaging studies  CBC: Recent Labs  Lab 08/17/17 0741 08/18/17 0407 08/19/17 0437 08/20/17 0647 08/21/17 0817  WBC 15.4* 13.8* 15.0* 16.0* 16.5*  HGB 9.8* 9.4* 9.6* 9.7* 9.7*  HCT 31.2* 29.8* 31.1* 30.5* 31.7*  MCV 84.3 84.7  85.9 85.0 85.4  PLT 680* 600* 579* 706* 423*   Basic Metabolic Panel: Recent Labs  Lab 08/17/17 0741 08/18/17 0407 08/19/17 0437 08/20/17 0647 08/21/17 0817  NA 136 137 139 137 140  K 4.5 4.2 4.5 4.0 4.0  CL 101 102 104 104 105  CO2 24 26 22 24 27   GLUCOSE 103* 105* 82 103* 111*  BUN 19 22* 20 23* 25*  CREATININE 1.79* 1.57* 1.43* 1.49* 1.33*  CALCIUM 8.7* 8.6* 8.7* 8.6* 8.7*   GFR: Estimated Creatinine Clearance: 50.4 mL/min (A) (by C-G formula based on SCr of 1.33 mg/dL (H)). Liver Function Tests: Recent Labs  Lab 08/19/17 0437 08/20/17 0647  AST 82* 80*  ALT 28 28  ALKPHOS 184* 203*  BILITOT 0.9 0.8  PROT 5.9* 6.1*  ALBUMIN 2.3* 2.5*   No results for input(s): LIPASE, AMYLASE in the last 168 hours. Recent Labs  Lab 08/15/17 1516  AMMONIA 36*   Coagulation Profile: No results for input(s): INR, PROTIME in the last 168 hours. Cardiac Enzymes: No results for input(s): CKTOTAL, CKMB, CKMBINDEX, TROPONINI in the last 168 hours. BNP (last 3 results) No results for input(s): PROBNP in the last 8760 hours. HbA1C: No results for input(s): HGBA1C in the last 72 hours. CBG: No results for input(s): GLUCAP in the last 168 hours. Lipid Profile: No results for input(s): CHOL, HDL, LDLCALC, TRIG, CHOLHDL, LDLDIRECT in the last 72 hours. Thyroid Function Tests: No results for input(s): TSH, T4TOTAL, FREET4, T3FREE, THYROIDAB in the last 72 hours. Anemia Panel: No results for input(s): VITAMINB12, FOLATE, FERRITIN, TIBC, IRON, RETICCTPCT in the last 72 hours. Sepsis Labs: No results for input(s): PROCALCITON, LATICACIDVEN in the last 168 hours.  Recent Results (from the past 240 hour(s))  Urine Culture     Status: Abnormal   Collection Time: 08/15/17  1:58 PM  Result Value Ref Range Status   Specimen Description URINE, CLEAN CATCH  Final   Special Requests NONE  Final   Culture (A)  Final    <10,000 COLONIES/mL INSIGNIFICANT GROWTH Performed at [place], [address] [place], [place] [address]    Report Status 08/16/2017 FINAL  Final  Culture, blood (routine x 2)     Status: None   Collection Time: 08/15/17  3:16 PM  Result Value Ref Range Status   Specimen Description BLOOD LEFT HAND  Final   Special Requests   Final    BOTTLES DRAWN AEROBIC AND ANAEROBIC Blood Culture adequate volume   Culture   Final    NO GROWTH 5 DAYS Performed at [facility], [place] [address] [place],  [address]    Report Status 08/20/2017 FINAL  Final  Culture, blood (routine x 2)     Status: None   Collection Time: 08/15/17  3:16 PM  Result Value Ref Range Status   Specimen Description BLOOD LEFT HAND  Final   Special Requests   Final    BOTTLES DRAWN AEROBIC AND ANAEROBIC Blood Culture adequate volume   Culture   Final    NO GROWTH 5 DAYS Performed at [facility]  Lab, [address] [place], [place] [address]    Report Status 08/20/2017 FINAL  Final         Radiology Studies: Mr Abdomen W Wo Contrast  Result Date: 08/21/2017 CLINICAL DATA:  Multifocal hepatocellular carcinoma EXAM: MRI ABDOMEN WITHOUT AND WITH CONTRAST TECHNIQUE: Multiplanar multisequence MR imaging of the abdomen was performed both before and after the administration of intravenous contrast. CONTRAST:  87mL MULTIHANCE GADOBENATE DIMEGLUMINE 529 MG/ML IV SOLN COMPARISON:  MRI 08/16/2017 and 08/19/2017 FINDINGS: Lower chest: No pulmonary lesions or pleural effusion. Streaky bibasilar atelectasis. No pericardial effusion. Hepatobiliary: Hepatomegaly due to multiple right hepatic lobe masses. There are large lesions in segment 8, segment 7, segment 5 and segment 6. I do not see any left hepatic lobe disease. The segment 8 lesion pushes and compresses the middle hepatic vein laterally. The segment 8 lesion measures 9.5 x 8.7 cm. Segment 7 lesion measures 12.5 x 12.3 cm. Segment 5 lesion measures 11.5 x 7.0 cm. Segment 6 lesion measures 12.4 x 10.4 cm. The gallbladder is  normal.  No common bile duct dilatation. Pancreas:  No mass, inflammation or ductal dilatation. Spleen:  Normal size.  No focal lesions. Adrenals/Urinary Tract: Stable left adrenal gland mass measuring 3.5 cm. This demonstrates loss of signal intensity on the T1 weighted gradient echo sequence consistent with a benign adenoma. Simple appearing right renal cysts. The largest cyst measures 9.6 cm and is projecting off the lower pole region of the right kidney. No hydronephrosis. Stomach/Bowel: Visualized portions within the abdomen are unremarkable. There is a benign-appearing cystic area associated with the gastric cardia. Vascular/Lymphatic: No pathologically enlarged lymph nodes identified. Stable borderline periportal and celiac axis lymph nodes typically seen with cirrhosis. No abdominal aortic aneurysm demonstrated. Other:  No ascites or abdominal wall hernia. Musculoskeletal: No significant bony findings. IMPRESSION: 1. 4 large right hepatic lobe masses, 1 in each segment. No definite left hepatic lobe disease. 2. Stable left adrenal gland adenoma. 3. Small upper abdominal lymph nodes likely related to the patient's cirrhosis. Electronically Signed   By: Marijo Sanes M.D.   On: 08/21/2017 14:00   Mr Liver Wo Conrtast  Result Date: 08/19/2017 CLINICAL DATA:  Biopsy proven multicentric hepatocellular carcinoma. Surgical planning. EXAM: MRI ABDOMEN WITHOUT CONTRAST (LIMITED) TECHNIQUE: Multiplanar multisequence MR imaging was attempted without the administration of intravenous contrast. Only coronal and axial HASTE and axial T2 weighted images were obtained due to the patient's condition. The study could not be completed. COMPARISON:  MRI 08/16/2017.  CT 03/04/2008 and 08/10/2017. FINDINGS: Lower chest: As above, the study is incomplete and is motion degraded. There is atelectasis at both lung bases. Hepatobiliary: Multiple large masses are again noted within the right hepatic lobe. Again, no definite  involvement of the left hepatic lobe. The middle hepatic vein remains suboptimally visualized, although appears patent. Pancreas:  Appear stable without significant findings. Spleen:  Normal in size without focal abnormality. Adrenals/Urinary Tract: Stable 3.5 x 3.4 cm left adrenal nodule, benign based on previous characterization and stability. The right adrenal gland appears normal. Small renal cysts bilaterally. Stomach/Bowel: Unremarkable. Vascular/Lymphatic: No enlarged abdominal lymph nodes. Aortic atherosclerosis. Other:  Mild mesenteric and retroperitoneal edema.  No ascites. Musculoskeletal: No acute or significant osseous findings. Mild spondylosis. IMPRESSION: 1. Attempted repeat examination for surgical planning remains limited and is incomplete. Only 3 sequences were obtained. 2. 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 No definite involvement of the left lobe. Electronically Signed   By: Gwyndolyn Saxon  Lin Landsman M.D.   On: 08/19/2017 16:20        Scheduled Meds: . feeding supplement (ENSURE ENLIVE)  237 mL Oral TID BM  . heparin  5,000 Units Subcutaneous Q8H  . lactulose  30 g Oral TID  . levETIRAcetam  500 mg Oral BID  . LORazepam  2 mg Intravenous Once  . mouth rinse  15 mL Mouth Rinse BID  . oxyCODONE  20 mg Oral Q12H   Continuous Infusions: . sodium chloride 10 mL/hr at 08/21/17 1312     LOS: 10 days    Time spent: 25 minutes.     Domenic Polite, MD [facility] Page via [email or web address] password [email or web address]  If 7PM-7AM, please contact night-coverage  08/21/2017, 2:18 PM

## 2017-08-21 NOTE — Progress Notes (Signed)
SLP Cancellation Note  Patient Details Name: Gabriel Romero MRN: [id number] DOB: 1950/09/23   Cancelled treatment:       Reason Eval/Treat Not Completed: Patient is NPO for a procedure.  ST will continue efforts to work with patient.   Shelly Flatten, MA, [facility] SLP [phone]  Lamar Sprinkles 08/21/2017, 9:20 AM

## 2017-08-21 NOTE — Progress Notes (Signed)
Day of Surgery   Subjective/Chief Complaint: Remains confused   Objective: Vital signs in last 24 hours: Temp:  [98.2 F (36.8 C)-98.9 F (37.2 C)] 98.2 F (36.8 C) (05/31 0647) Pulse Rate:  [74-114] 114 (05/31 0647) Resp:  [20] 20 (05/31 0647) BP: (134-157)/(70-89) 157/89 (05/31 0647) SpO2:  [95 %-100 %] 95 % (05/31 0647) Last BM Date: 08/19/17  Intake/Output from previous day: 05/30 0701 - 05/31 0700 In: 597 [P.O.:597] Out: 1220 [Urine:1220] Intake/Output this shift: No intake/output data recorded.  Exam: Abdomen soft, non-tender  Lab Results:  Recent Labs    08/19/17 0437 08/20/17 0647  WBC 15.0* 16.0*  HGB 9.6* 9.7*  HCT 31.1* 30.5*  PLT 579* 706*   BMET Recent Labs    08/19/17 0437 08/20/17 0647  NA 139 137  K 4.5 4.0  CL 104 104  CO2 22 24  GLUCOSE 82 103*  BUN 20 23*  CREATININE 1.43* 1.49*  CALCIUM 8.7* 8.6*   PT/INR No results for input(s): LABPROT, INR in the last 72 hours. ABG No results for input(s): PHART, HCO3 in the last 72 hours.  Invalid input(s): PCO2, PO2  Studies/Results: Mr Liver Wo Conrtast  Result Date: 08/19/2017 CLINICAL DATA:  Biopsy proven multicentric hepatocellular carcinoma. Surgical planning. EXAM: MRI ABDOMEN WITHOUT CONTRAST (LIMITED) TECHNIQUE: Multiplanar multisequence MR imaging was attempted without the administration of intravenous contrast. Only coronal and axial HASTE and axial T2 weighted images were obtained due to the patient's condition. The study could not be completed. COMPARISON:  MRI 08/16/2017.  CT 03/04/2008 and 08/10/2017. FINDINGS: Lower chest: As above, the study is incomplete and is motion degraded. There is atelectasis at both lung bases. Hepatobiliary: Multiple large masses are again noted within the right hepatic lobe. Again, no definite involvement of the left hepatic lobe. The middle hepatic vein remains suboptimally visualized, although appears patent. Pancreas:  Appear stable without significant  findings. Spleen:  Normal in size without focal abnormality. Adrenals/Urinary Tract: Stable 3.5 x 3.4 cm left adrenal nodule, benign based on previous characterization and stability. The right adrenal gland appears normal. Small renal cysts bilaterally. Stomach/Bowel: Unremarkable. Vascular/Lymphatic: No enlarged abdominal lymph nodes. Aortic atherosclerosis. Other:  Mild mesenteric and retroperitoneal edema.  No ascites. Musculoskeletal: No acute or significant osseous findings. Mild spondylosis. IMPRESSION: 1. Attempted repeat examination for surgical planning remains limited and is incomplete. Only 3 sequences were obtained. 2. 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 No definite involvement of the left lobe. Electronically Signed   By: Richardean Sale M.D.   On: 08/19/2017 16:20    Anti-infectives: Anti-infectives (From admission, onward)   Start     Dose/Rate Route Frequency Ordered Stop   08/17/17 1400  metroNIDAZOLE (FLAGYL) IVPB 500 mg  Status:  Discontinued     500 mg 100 mL/hr over 60 Minutes Intravenous Every 8 hours 08/17/17 1315 08/19/17 1410   08/15/17 1615  ceFEPIme (MAXIPIME) 2 g in sodium chloride 0.9 % 100 mL IVPB  Status:  Discontinued     2 g 200 mL/hr over 30 Minutes Intravenous Every 24 hours 08/15/17 1607 08/19/17 1410      Assessment/Plan: s/p Procedure(s): MRI WITH abdomen with and without (N/A)  Liver Mass/HCC  I discussed the case again with Dr. Barry Dienes yesterday.  Currently, this gentleman is not a surgical candidate from a medical standpoint.  Dr. Barry Dienes says she would not do any kind of resection on the patient until he was improved medically and from mental status  standpoint.  From her standpoint, the rest of the evaluation of the liver mass itself can be done as an outpatient as he does not require emergent surgery.  She would like to see him in the office as an outpatient if his medical status/mental status ever  improves. Currently, as an inpatient, there is nothing further for [place] to offer.  We will see him as needed.  LOS: 10 days    Lameka Disla A 08/21/2017

## 2017-08-22 NOTE — Progress Notes (Signed)
PROGRESS NOTE    Gabriel Romero  DGU:[id number] DOB: 1951/01/01 DOA: 08/10/2017 PCP: [facility]   Brief Narrative by Dr Rockne Menghini; "Gabriel Romero is an 67 y.o. 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  CT of the abdomen and pelvis showed multiple large liver masses consistent with metastatic disease  And AKI."  -Patient underwent liver biopsy 5-23. Pathology consistent with [facility].  -Dr Marin Olp consulted, recommended surgical evaluation  Assessment & Plan:  Hepatocellular carcinoma/new diagnosis/ large liver masses -Status post CT-guided liver biopsy 5/23 -CT chest unremarkable -AFP : 13,746.  -Dr Marin Olp consulted, recommended surgical evaluation to determine if patient would be considered a surgical candidate -CCS following, repeat MRI recommended due to motion artifact on prior study -s/p another failed attempt despite multiple ativan doses -finally for MRI under anesthesia completed 5/31, for a large hepatic masses in the right lobe, I called and discussed with Dr. Barry Dienes 5/31, does not appear to be a good surgical candidate due to dementia/cognitive issues however she will be happy to see him outpatient consultation. -If surgery is not an option he will be referred to interventional radiology for local treatments -discharge planning for SNF  Fever; Leukocytosis.  chest x ray bibasilar atelectasis.  UA negative, urine culture 10, K insignificant growth.  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 He was started on lactulose.  -continue current dose of  narcotics due to pain related to [facility], avoid escalation -infectious workup negative -unable to make decisions on his behalf, daughter has been handling and managing finances for 1year now  Seizure d/o -on keppra continue.   HTN;  Continue to hold HCTZ and lisinopril due to AKI.  -started low-dose amlodipine  Acute renal failure superimposed on stage 2 chronic kidney disease ([place] Baseline creatinine appears to be around 1.2.  Cr peak to 2.  Holding HCTZ and lisinopril.  -now stable at 1.49  Thrombocytosis; reactive.   Normocytic anemia;  Monitor. Hb stable at 9   Weight loss/underweight Body mass index is 26.55 kg/m. Evaluated by dietician, continue Ensure. 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  Hyponatremia; mild.   DVT prophylaxis: heparin.  Code Status: full code.  Family Communication: called and updated daughter [name] 5/30 And 5/31 Disposition Plan: SNF when bed available  Consultants:   Dr Marin Olp.   IR>   CCS   Procedures:  Liver Biopsy by IR   Antimicrobials: none  Subjective: -more alert this morning, able to have a reasonable conversation, still has significant memory deficit  Objective: Vitals:   08/21/17 1225 08/21/17 1236 08/21/17 2100 08/22/17 0648  BP: 116/65 114/62  (!) 141/67  Pulse: 78 75  81  Resp: 14 15  18   Temp:  98.4 F (36.9 C) 98.6 F (37 C) (!) 97.5 F (36.4 C)  TempSrc:   Oral Oral  SpO2: 98% 96%  93%  Weight:      Height:        Intake/Output Summary (Last 24 hours) at 08/22/2017 1116 Last data filed at 08/22/2017 0959 Gross per 24 hour  Intake 522 ml  Output 500 ml  Net 22 ml   [employer]  08/18/17 0500 08/19/17 0500 08/20/17 0611  Weight: 77.3 kg (170 lb 8 oz) 77.6 kg (171 lb) 77.6 kg (171 lb)    Examination:  Gen: Awake, Alert, oriented to self and partially to place, significant memory deficits noted HEENT: PERRLA, Neck supple, no JVD Lungs: Good air movement bilaterally, CTAB CVS: RRR,No Gallops,Rubs or new  Murmurs Abd: soft, mild right-sided tenderness, bowel sounds present, nondistended Extremities: No Cyanosis, Clubbing or edema Skin: no new rashes Psychiatry: less confused    Data Reviewed: I have personally reviewed following labs and imaging studies  CBC: Recent Labs  Lab 08/17/17 0741 08/18/17 0407 08/19/17 0437 08/20/17 0647 08/21/17 0817  WBC 15.4* 13.8* 15.0* 16.0* 16.5*  HGB 9.8* 9.4* 9.6* 9.7* 9.7*  HCT 31.2* 29.8* 31.1* 30.5* 31.7*  MCV 84.3 84.7 85.9 85.0 85.4  PLT 680* 600* 579* 706* 601*   Basic Metabolic Panel: Recent Labs  Lab 08/17/17 0741 08/18/17 0407 08/19/17 0437 08/20/17 0647 08/21/17 0817  NA 136 137 139 137 140  K 4.5 4.2 4.5 4.0 4.0  CL 101 102 104 104 105  CO2 24 26 22 24 27   GLUCOSE 103* 105* 82 103* 111*  BUN 19 22* 20 23* 25*  CREATININE 1.79* 1.57* 1.43* 1.49* 1.33*  CALCIUM 8.7* 8.6* 8.7* 8.6* 8.7*   GFR: Estimated Creatinine Clearance: 50.4 mL/min (A) (by C-G formula based on SCr of 1.33 mg/dL (H)). Liver Function Tests: Recent Labs  Lab 08/19/17 0437 08/20/17 0647  AST 82* 80*  ALT 28 28  ALKPHOS 184* 203*  BILITOT 0.9 0.8  PROT 5.9* 6.1*  ALBUMIN 2.3* 2.5*   No results for input(s): LIPASE, AMYLASE in the last 168 hours. Recent Labs  Lab 08/15/17 1516  AMMONIA 36*   Coagulation Profile: No results for input(s): INR, PROTIME in the last 168 hours. Cardiac Enzymes: No results for input(s): CKTOTAL, CKMB, CKMBINDEX, TROPONINI in the last 168 hours. BNP (last 3 results) No results for input(s): PROBNP in the last 8760 hours. HbA1C: No results for input(s): HGBA1C in the last 72 hours. CBG: No results for input(s): GLUCAP in the last 168 hours. Lipid Profile: No results for input(s): CHOL, HDL, LDLCALC, TRIG, CHOLHDL, LDLDIRECT in the last 72 hours. Thyroid Function Tests: No results for input(s): TSH, T4TOTAL, FREET4, T3FREE, THYROIDAB in the last 72 hours. Anemia Panel: No results for input(s): VITAMINB12,  FOLATE, FERRITIN, TIBC, IRON, RETICCTPCT in the last 72 hours. Sepsis Labs: No results for input(s): PROCALCITON, LATICACIDVEN in the last 168 hours.  Recent Results (from the past 240 hour(s))  Urine Culture     Status: Abnormal   Collection Time: 08/15/17  1:58 PM  Result Value Ref Range Status   Specimen Description URINE, CLEAN CATCH  Final   Special Requests NONE  Final   Culture (A)  Final    <10,000 COLONIES/mL INSIGNIFICANT GROWTH Performed at [facility], [address] [place], [place] [address]    Report Status 08/16/2017 FINAL  Final  Culture, blood (routine x 2)     Status: None   Collection Time: 08/15/17  3:16 PM  Result Value Ref Range Status   Specimen Description BLOOD LEFT HAND  Final   Special Requests   Final    BOTTLES DRAWN AEROBIC AND ANAEROBIC Blood Culture adequate volume   Culture   Final    NO GROWTH 5 DAYS Performed at [facility], [place] [address], [place], [place] [address]    Report Status 08/20/2017 FINAL  Final  Culture, blood (routine x 2)     Status: None   Collection Time: 08/15/17  3:16 PM  Result Value Ref Range Status   Specimen Description BLOOD LEFT HAND  Final   Special Requests   Final    BOTTLES DRAWN AEROBIC AND ANAEROBIC Blood Culture adequate volume   Culture   Final    NO GROWTH 5 DAYS Performed at [facility], [address], [place], [place] [address]    Report Status 08/20/2017 FINAL  Final         Radiology Studies: Mr Abdomen W Wo Contrast  Result Date: 08/21/2017 CLINICAL DATA:  Multifocal hepatocellular carcinoma EXAM: MRI ABDOMEN WITHOUT AND WITH CONTRAST TECHNIQUE: Multiplanar multisequence MR imaging of the abdomen was performed both before and after the administration of intravenous contrast. CONTRAST:  28mL MULTIHANCE GADOBENATE DIMEGLUMINE 529 MG/ML IV SOLN COMPARISON:  MRI 08/16/2017 and 08/19/2017 FINDINGS: Lower chest: No pulmonary lesions or pleural effusion. Streaky bibasilar  atelectasis. No pericardial effusion. Hepatobiliary: Hepatomegaly due to multiple right hepatic lobe masses. There are large lesions in segment 8, segment 7, segment 5 and segment 6. I do not see any left hepatic lobe disease. The segment 8 lesion pushes and compresses the middle hepatic vein laterally. The segment 8 lesion measures 9.5 x 8.7 cm. Segment 7 lesion measures 12.5 x 12.3 cm. Segment 5 lesion measures 11.5 x 7.0 cm. Segment 6 lesion measures 12.4 x 10.4 cm. The gallbladder is normal.  No common bile duct dilatation. Pancreas:  No mass, inflammation or ductal dilatation. Spleen:  Normal size.  No focal lesions. Adrenals/Urinary Tract: Stable left adrenal gland mass measuring 3.5 cm. This demonstrates loss of signal intensity on the T1 weighted gradient echo sequence consistent with a benign adenoma. Simple appearing right renal cysts. The largest cyst measures 9.6 cm and is projecting off the lower pole region of the right kidney. No hydronephrosis. Stomach/Bowel: Visualized portions within the abdomen are unremarkable. There is a benign-appearing cystic area associated with the gastric cardia. Vascular/Lymphatic: No pathologically enlarged lymph nodes identified. Stable borderline periportal and celiac axis lymph nodes typically seen with cirrhosis. No abdominal aortic aneurysm demonstrated. Other:  No ascites or abdominal wall hernia. Musculoskeletal: No significant bony findings. IMPRESSION: 1. 4 large right hepatic lobe masses, 1 in each segment. No definite left hepatic lobe disease. 2. Stable left adrenal gland adenoma. 3. Small upper abdominal lymph nodes likely related to the patient's cirrhosis. Electronically Signed   By: Marijo Sanes M.D.   On: 08/21/2017 14:00        Scheduled Meds: . amLODipine  5 mg Oral Daily  . aspirin  81 mg Oral Daily  . feeding supplement (ENSURE ENLIVE)  237 mL Oral TID BM  . heparin  5,000 Units Subcutaneous Q8H  . lactulose  30 g Oral TID  .  levETIRAcetam  500 mg Oral BID  . LORazepam  2 mg Intravenous Once  . mouth rinse  15 mL Mouth Rinse BID  . oxyCODONE  20 mg Oral Q12H   Continuous Infusions: . sodium chloride 10 mL/hr at 08/21/17 1312     LOS: 11 days    Time spent: 25 minutes.     Domenic Polite, MD [facility] Page via [email or web address] password [email or web address]  If 7PM-7AM, please contact night-coverage  08/22/2017, 11:16 AM

## 2017-08-22 NOTE — Progress Notes (Signed)
SLP Cancellation Note  Patient Details Name: HUDSEN FEI MRN: [id number] DOB: 22-Jul-1950   Cancelled treatment:       Reason Eval/Treat Not Completed: Patient declined, no reason specified. Pt on regular diet, thin liquids per MD. SLP attempted f/u to assess tolerance. Pt declined, stating he is full and can't eat another bite. Will continue efforts.  Deneise Lever, [place], [place] Speech-Language Pathologist [phone]   Aliene Altes 08/22/2017, 4:45 PM

## 2017-08-23 NOTE — Progress Notes (Signed)
  Speech Language Pathology Treatment: Dysphagia  Patient Details Name: Gabriel Romero MRN: [id number] DOB: 1951-02-05 Today's Date: 08/23/2017 Time: 8891-6945 SLP Time Calculation (min) (ACUTE ONLY): 14 min  Assessment / Plan / Recommendation Clinical Impression  Pt seen for follow-up for dysphagia. Confusion persists; no family present but has documented history of confusion, memory loss post-CVA and vascular dementia. Pt fully alert and responding to SLP, needs cuing to follow commands. He is able to self feed with max assistance; oral preparation is adequate for soft solids from breakfast tray, with good oral clearance. Pt declined regular solids. With assistance he is also able self-feed cup sips of thin liquids, but he required max verbal/tacile assist for appropriate orientation of cup. Swallow response appears timely and airway protection adequate; pt's primary risk for aspiration is his impaired cognition and confusion. Would continue current diet orders (reg/thin) but provide full supervision and assistance as pt is unable to self-feed without max assist. Recommend meds whole in puree. Will follow for tolerance.         HPI HPI: Gabriel Romero is an 67 y.o. 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  CT of the abdomen and pelvis showed multiple large liver masses consistent with metastatic disease from an unknown primary.  Also found to have AKI. Patient underwent liver biopsy 5-23. Pathology consistent with [place]. Pt with encephalopathy and fever; CXR showed bibasilar atelectasis. Evaluated by SLP during admission for CVA 06/19/16 for cognition only; passed stroke swallow screen at that time.       SLP Plan  Continue with current plan of care       Recommendations  Diet recommendations: Regular;Thin liquid Liquids  provided via: Cup Medication Administration: Whole meds with puree Supervision: Full supervision/cueing for compensatory strategies Compensations: Slow rate;Small sips/bites;Minimize environmental distractions;Follow solids with liquid Postural Changes and/or Swallow Maneuvers: Seated upright 90 degrees                Oral Care Recommendations: Oral care BID Follow up Recommendations: Other (comment)(tbd) SLP Visit Diagnosis: Dysphagia, unspecified (R13.10) Plan: Continue with current plan of care       [place], [place], [place] Speech-Language Pathologist [place] 08/23/2017, 10:46 AM

## 2017-08-23 NOTE — Progress Notes (Addendum)
Pt seen, no changes, more fidgety this am, but redirectable, has been less agitated for past 48hours Plan for SNF soon when bed available  Domenic Polite, MD

## 2017-08-24 DIAGNOSIS — Z8673 Personal history of transient ischemic attack (TIA), and cerebral infarction without residual deficits: Secondary | ICD-10-CM | POA: Diagnosis not present

## 2017-08-24 DIAGNOSIS — I6930 Unspecified sequelae of cerebral infarction: Secondary | ICD-10-CM | POA: Diagnosis not present

## 2017-08-24 DIAGNOSIS — Z720 Tobacco use: Secondary | ICD-10-CM | POA: Diagnosis not present

## 2017-08-24 DIAGNOSIS — K7689 Other specified diseases of liver: Secondary | ICD-10-CM | POA: Diagnosis not present

## 2017-08-24 DIAGNOSIS — R160 Hepatomegaly, not elsewhere classified: Secondary | ICD-10-CM | POA: Diagnosis not present

## 2017-08-24 DIAGNOSIS — E7220 Disorder of urea cycle metabolism, unspecified: Secondary | ICD-10-CM | POA: Diagnosis not present

## 2017-08-24 DIAGNOSIS — N182 Chronic kidney disease, stage 2 (mild): Secondary | ICD-10-CM | POA: Diagnosis not present

## 2017-08-24 DIAGNOSIS — G4089 Other seizures: Secondary | ICD-10-CM | POA: Diagnosis not present

## 2017-08-24 DIAGNOSIS — N189 Chronic kidney disease, unspecified: Secondary | ICD-10-CM | POA: Diagnosis not present

## 2017-08-24 DIAGNOSIS — C220 Liver cell carcinoma: Secondary | ICD-10-CM | POA: Diagnosis not present

## 2017-08-24 DIAGNOSIS — R410 Disorientation, unspecified: Secondary | ICD-10-CM | POA: Diagnosis not present

## 2017-08-24 DIAGNOSIS — Z79899 Other long term (current) drug therapy: Secondary | ICD-10-CM | POA: Diagnosis not present

## 2017-08-24 DIAGNOSIS — C787 Secondary malignant neoplasm of liver and intrahepatic bile duct: Secondary | ICD-10-CM | POA: Diagnosis not present

## 2017-08-24 DIAGNOSIS — D473 Essential (hemorrhagic) thrombocythemia: Secondary | ICD-10-CM | POA: Diagnosis not present

## 2017-08-24 DIAGNOSIS — N179 Acute kidney failure, unspecified: Secondary | ICD-10-CM | POA: Diagnosis not present

## 2017-08-24 DIAGNOSIS — R772 Abnormality of alphafetoprotein: Secondary | ICD-10-CM | POA: Diagnosis not present

## 2017-08-24 DIAGNOSIS — R270 Ataxia, unspecified: Secondary | ICD-10-CM | POA: Diagnosis not present

## 2017-08-24 DIAGNOSIS — R109 Unspecified abdominal pain: Secondary | ICD-10-CM | POA: Diagnosis not present

## 2017-08-24 DIAGNOSIS — R69 Illness, unspecified: Secondary | ICD-10-CM | POA: Diagnosis not present

## 2017-08-24 DIAGNOSIS — G40909 Epilepsy, unspecified, not intractable, without status epilepticus: Secondary | ICD-10-CM | POA: Diagnosis not present

## 2017-08-24 DIAGNOSIS — I10 Essential (primary) hypertension: Secondary | ICD-10-CM | POA: Diagnosis not present

## 2017-08-24 MED ORDER — LACTULOSE 10 GM/15ML PO SOLN: 30.0000 g | Freq: Two times a day (BID) | ORAL | 0 refills | Status: AC

## 2017-08-24 MED ORDER — SENNOSIDES-DOCUSATE SODIUM 8.6-50 MG PO TABS: 1.0000 | ORAL_TABLET | Freq: Every evening | ORAL | Status: AC | PRN

## 2017-08-24 MED ORDER — CLONIDINE HCL 0.1 MG PO TABS
0.1000 mg | ORAL_TABLET | Freq: Once | ORAL | Status: AC
  Administered 2017-08-24: 0.1 mg via ORAL
  Filled 2017-08-24: qty 1

## 2017-08-24 MED ORDER — OXYCODONE HCL ER 20 MG PO T12A: 20.0000 mg | EXTENDED_RELEASE_TABLET | Freq: Two times a day (BID) | ORAL | 0 refills | Status: AC

## 2017-08-24 MED ORDER — ACETAMINOPHEN 325 MG PO TABS
650.0000 mg | ORAL_TABLET | Freq: Four times a day (QID) | ORAL | Status: AC | PRN
Start: 2017-08-24 — End: ?

## 2017-08-24 MED ORDER — AMLODIPINE BESYLATE 5 MG PO TABS: 5.0000 mg | ORAL_TABLET | Freq: Every day | ORAL | Status: AC

## 2017-08-24 MED ORDER — OXYCODONE HCL 5 MG PO TABS: 5.0000 mg | ORAL_TABLET | Freq: Four times a day (QID) | ORAL | 0 refills | Status: AC | PRN

## 2017-08-24 NOTE — Progress Notes (Signed)
Physical Therapy Treatment Patient Details Name: Gabriel Romero MRN: [id number] DOB: 03/23/1951 Today's Date: 08/24/2017    History of Present Illness Gabriel Romero is an 67 y.o. 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  CT of the abdomen and pelvis showed multiple large liver masses consistent with metastatic disease from an unknown primary.  Also found to have AKI. Found to be primary liver cancer. No metastases noted on CT scan.    PT Comments    Session primarily limited by pt's confusion. He requires continuous cues and redirection to stay on task. Pt returned to bed at end of session due to unsafe to leave up in recliner. He is very restless. +2 assist required for bed mobility and transfers.    Follow Up Recommendations  SNF;Supervision/Assistance - 24 hour     Equipment Recommendations  None recommended by PT    Recommendations for Other Services       Precautions / Restrictions Precautions Precautions: Fall Restrictions Weight Bearing Restrictions: No    Mobility  Bed Mobility Overal bed mobility: Needs Assistance Bed Mobility: Supine to Sit;Sit to Supine     Supine to sit: HOB elevated;Mod assist Sit to supine: HOB elevated;Max assist;+2 for physical assistance   General bed mobility comments: continuous cues for sequencing and participation.  Transfers Overall transfer level: Needs assistance Equipment used: Rolling walker (2 wheeled) Transfers: Sit to/from [facility] Sit to Stand: +2 physical assistance;Mod assist Stand pivot transfers: +2 physical assistance;Mod assist       General transfer comment: Sit to stand with RW. Pt able to stand with RW and mod assist for NT to clean his backside. SPT with RW bed to/from [facility].   Ambulation/Gait             General Gait  Details: unable   Stairs             Wheelchair Mobility    Modified Rankin (Stroke Patients Only)       Balance Overall balance assessment: Needs assistance Sitting-balance support: Feet supported Sitting balance-Leahy Scale: Fair     Standing balance support: Bilateral upper extremity supported;During functional activity Standing balance-Leahy Scale: Poor                              Cognition Arousal/Alertness: Awake/alert Behavior During Therapy: Impulsive;Restless Overall Cognitive Status: No family/caregiver present to determine baseline cognitive functioning Area of Impairment: Orientation;Attention;Memory;Following commands;Safety/judgement;Problem solving                 Orientation Level: Disoriented to;Place;Situation;Time Current Attention Level: Sustained Memory: Decreased short-term memory;Decreased recall of precautions Following Commands: Follows one step commands inconsistently;Follows one step commands with increased time Safety/Judgement: Decreased awareness of safety   Problem Solving: Slow processing;Difficulty sequencing;Requires verbal cues;Requires tactile cues;Decreased initiation General Comments: Pt found naked in bed. He had a BM and gotten his hands in it. Smears were on him and the bed.       Exercises      General Comments        Pertinent Vitals/Pain Pain Assessment: Faces Faces Pain Scale: Hurts even more Pain Location: generalized with mobility Pain Descriptors / Indicators: Grimacing;Guarding;Moaning Pain Intervention(s): Limited activity within patient's tolerance;Monitored during session    Home Living  Prior Function            PT Goals (current goals can now be found in the care plan section) Acute Rehab PT Goals Patient Stated Goal: not stated PT Goal Formulation: With patient/family Time For Goal Achievement: 08/28/17 Potential to Achieve Goals: Fair Progress  towards PT goals: Progressing toward goals    Frequency    Min 2X/week      PT Plan Current plan remains appropriate    Co-evaluation              AM-PAC PT "6 Clicks" Daily Activity  Outcome Measure  Difficulty turning over in bed (including adjusting bedclothes, sheets and blankets)?: A Little Difficulty moving from lying on back to sitting on the side of the bed? : Unable Difficulty sitting down on and standing up from a chair with arms (e.g., wheelchair, bedside commode, etc,.)?: Unable Help needed moving to and from a bed to chair (including a wheelchair)?: A Lot Help needed walking in hospital room?: Total Help needed climbing 3-5 steps with a railing? : Total 6 Click Score: 9    End of Session Equipment Utilized During Treatment: Gait belt Activity Tolerance: Other (comment)(confusion) Patient left: in bed;with call bell/phone within reach;with bed alarm set Nurse Communication: Mobility status PT Visit Diagnosis: Unsteadiness on feet (R26.81);History of falling (Z91.81);Other abnormalities of gait and mobility (R26.89);Difficulty in walking, not elsewhere classified (R26.2)     Time: 3338-3291 PT Time Calculation (min) (ACUTE ONLY): 24 min  Charges:  $Therapeutic Activity: 23-37 mins                    G Codes:       Lorrin Goodell, PT  Office # [phone] Pager [phone]    Lorriane Shire 08/24/2017, 9:28 AM

## 2017-08-24 NOTE — Progress Notes (Signed)
Dr Broadus John returned call about daughter's concern of pt getting strep throat from her son. Pt has been eating and swallowing without difficultly. Pt denies any pain to throat or pain with swallowing. Upon exam pt has no redness or white patches in back of throat or uvula, appears normal upon assessment.  Dr Broadus John states cannot treat for strep without symptoms.   SW, Percell Locus calling daughter to discuss update on discharge and will inform her that pt does not have any symptoms at this time, MD not able to treat for strep exposure from family.  Pt can be seen by SNF MD if has complaints of symptoms.

## 2017-08-24 NOTE — Care Management Important Message (Signed)
Important Message  Patient Details  Name: DESHANE COTRONEO MRN: [id number] Date of Birth: [birth year]   Medicare Important Message Given:  Yes    Jenicka Coxe Montine Circle 08/24/2017, 3:40 PM

## 2017-08-24 NOTE — Clinical Social Work Placement (Signed)
   CLINICAL SOCIAL WORK PLACEMENT  NOTE  Date:  08/24/2017  Patient Details  Name: Gabriel Romero MRN: [id number] Date of Birth: 25-Jun-1950  Clinical Social Work is seeking post-discharge placement for this patient at the [place] level of care (*CSW will initial, date and re-position this form in  chart as items are completed):  Yes   Patient/family provided with [facility] Work Department's list of facilities offering this level of care within the geographic area requested by the patient (or if unable, by the patient's family).  Yes   Patient/family informed of their freedom to choose among providers that offer the needed level of care, that participate in Medicare, Medicaid or managed care program needed by the patient, have an available bed and are willing to accept the patient.  Yes   Patient/family informed of [facility]'s ownership interest in [facility] and [facility], as well as of the fact that they are under no obligation to receive care at these facilities.  PASRR submitted to EDS on       PASRR number received on       Existing PASRR number confirmed on 08/14/17     FL2 transmitted to all facilities in geographic area requested by pt/family on 08/14/17     FL2 transmitted to all facilities within larger geographic area on       Patient informed that his/her managed care company has contracts with or will negotiate with certain facilities, including the following:        Yes   Patient/family informed of bed offers received.  Patient chooses bed at [place] [facility]     Physician recommends and patient chooses bed at      Patient to be transferred to [facility] [place] on 08/24/17.  Patient to be transferred to facility by [facility]     Patient family notified on 08/24/17 of transfer.  Name of family member notified:  Daughter, Brooke     PHYSICIAN       Additional Comment:     _______________________________________________ Benard Halsted, [place] 08/24/2017, 10:47 AM

## 2017-08-24 NOTE — Discharge Summary (Addendum)
Physician Discharge Summary  Gabriel Romero ZOX:[id number] DOB: 03-Jul-1950 DOA: 08/10/2017  PCP: [facility]  Admit date: 08/10/2017 Discharge date: 08/24/2017  Time spent: 45 minutes  Recommendations for Outpatient Follow-up:  1. Dr.Feara Byerly 6/12 at 3pm-please ensure follow up 2. Oncology Dr.Ennever in 2 weeks 3. 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   Prior Stroke   Former Alcoholic   Seizure disorder ([place])   Tobacco abuse   HTN (hypertension)   Acute renal failure superimposed on stage 2 chronic kidney disease (HCC)   Liver masses   Thrombocytosis (HCC)   Normocytic anemia   AKI (acute kidney injury) ([place])   Hyponatremia   Discharge Condition: improved  Diet recommendation: low sodium  Filed Weights   08/18/17 0500 08/19/17 0500 08/20/17 0611  Weight: 77.3 kg (170 lb 8 oz) 77.6 kg (171 lb) 77.6 kg (171 lb)    History of present illness:  Leocadio Heal Dupreeis an 67 y.o.malewith a PMH of intracranial hemorrhage, seizure disorder, 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 CT of the abdomen and pelvis showed multiple large liver masses consistent with metastatic disease and AKI."   Hospital Course:   Hepatocellular carcinoma/new diagnosis/ large liver masses -he was admitted with increased confusion and R sided abd pain, found to have multiple large R sided liver masses on CT abdomen -IR consulted, Status post CT-guided liver biopsy 5/23, pathology with Hepatocellular carcinoma, CT chest negative for distant metastasis -AFP : 13,746.  -Dr Marin Olp consulted, recommended surgical evaluation to determine if patient would be considered a surgical candidate -CCS consulted, repeat MRI recommended due to motion artifact on prior study -finally despite multiple  attempts, completed MRI under anesthesia completed 5/31, notable for multiple large hepatic masses in the right lobe, I called and discussed with Dr. Barry Dienes 5/31, does not appear to be a good surgical candidate due to dementia/cognitive issues however she will be happy to see him outpatient consultation, I made FU with Dr.Byerly for 6/12 -If surgery is not an option he will be referred to interventional radiology for local treatments -discharge planning for SNF -also advised FU with Dr.Ennever  Fever; Leukocytosis.  -had intermittent low-grade fever early this admission, infectious workup was negative -was briefly on antibiotics which was stopped early on this admission -Suspect mediated by underlying cancer  Dementia/cognitive dysfunction per daughter patient has been confused since his stroke in [facility] 2018, and has significant  memory deficits, likely has Vascular dementia +/- ETOH induced dementia -ammonia mildly elevated this admission, he was started on lactulose.  -continue current dose of narcotics due to pain related to [facility], avoid escalation -unable to make decisions on his behalf, daughter has been handling and managing finances for 1year now, she has been told about the importance of her following up with his doctors appointments to decide course of his management.  Seizure d/o -on keppra continued  HTN;  -Held HCTZ and lisinopril due to AKI.  -started low-dose amlodipine -creatinine stable in 1.3-1.4 range  Acute renal failure superimposed on stage 2 chronic kidney disease ([place] Baseline creatinine appears to be around 1.2.  -creatinine peak to 2, stopped HCTZ and lisinopril.  -now stable at 1.3-1.4  Thrombocytosis; reactive.  -from underlying CA  Normocytic anemia;  Monitor. Hb stable at 9  Weight loss/underweight Body mass index is  26.55 kg/m.Evaluated by dietician, continue Ensure. 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  Hyponatremia; mild. -resolved    Consultants:   Dr Marin Olp.   IR>   CCS   Procedures:  Liver Biopsy by IR      Discharge Exam: Vitals:   08/24/17 0800 08/24/17 1041  BP: 131/81 (!) 137/91  Pulse: (!) 105 96  Resp: 16   Temp: 98.3 F (36.8 C)   SpO2: 96%     General: AAOx3 Cardiovascular: S1S2/RRR Respiratory: CTAB  Discharge Instructions   Discharge Instructions    Diet - low sodium heart healthy   Complete by:  As directed    Increase activity slowly   Complete by:  As directed      Allergies as of 08/24/2017      Reactions   Sulfa Antibiotics Rash      Medication List    STOP taking these medications   lisinopril-hydrochlorothiazide 20-12.5 MG tablet Commonly known as:  PRINZIDE,ZESTORETIC     TAKE these medications   acetaminophen 325 MG tablet Commonly known as:  TYLENOL Take 2 tablets (650 mg total) by mouth every 6 (six) hours as needed for mild pain or headache (or Fever >/= 101).   amLODipine 5 MG tablet Commonly known as:  NORVASC Take 1 tablet (5 mg total) by mouth daily. Start taking on:  08/25/2017   aspirin EC 81 MG tablet Take 1 tablet (81 mg total) by mouth daily.   atorvastatin 20 MG tablet Commonly known as:  LIPITOR Take 1 tablet (20 mg total) by mouth daily at 6 PM.   lactulose 10 GM/15ML solution Commonly known as:  CHRONULAC Take 45 mLs (30 g total) by mouth 2 (two) times daily.   levETIRAcetam 500 MG tablet Commonly known as:  KEPPRA Take 1 tablet (500 mg total) by mouth 2 (two) times daily.   oxyCODONE 5 MG immediate release tablet Commonly known as:  Oxy IR/ROXICODONE Take 1 tablet (5 mg total) by mouth every 6 (six) hours as needed for severe pain.   oxyCODONE 20 mg 12 hr tablet Commonly known as:  OXYCONTIN Take 1 tablet (20 mg total) by mouth every 12 (twelve) hours.   pantoprazole 40 MG tablet Commonly known as:  PROTONIX Take 1 tablet (40 mg total) by mouth daily.   senna-docusate 8.6-50 MG tablet Commonly known as:   Senokot-S Take 1 tablet by mouth at bedtime as needed for mild constipation.      Allergies  Allergen Reactions  . Sulfa Antibiotics Rash    Contact information for follow-up providers    Stark Klein, MD Follow up on 09/02/2017.   Specialty:  General Surgery Why:  at [facility] information: [address] [place] [address] [phone]        Volanda Napoleon, MD. Schedule an appointment as soon as possible for a visit in 2 week(s).   Specialty:  Oncology Contact information: [address] [place] [address] [phone]            Contact information for after-discharge care    Destination    [facility] .   Service:  Skilled Nursing Contact information: [address] [place] [phone]                   The results of significant diagnostics from this hospitalization (including imaging, microbiology, ancillary and laboratory) are listed below for reference.    Significant Diagnostic Studies:  Dg Chest 2 View  Result Date: 08/11/2017 CLINICAL DATA:  Smoking history. Hepatic lesions on CT earlier today. EXAM: CHEST - 2 VIEW COMPARISON:  No prior chest imaging. Lung bases from abdominal CT yesterday. FINDINGS: Heart size upper normal. Atherosclerosis of the aortic arch. Vague right infrahilar soft tissue prominence, nonspecific. No pulmonary edema. No dominant pulmonary mass or consolidation. No pleural effusion or pneumothorax. IMPRESSION: Vague right infrahilar soft tissue fullness, likely overlapping vascular structures, without suspicious abnormality on included lung bases from abdominal CT. No dominant pulmonary mass or suspicious nodules visualized radiographically. Electronically Signed   By: Jeb Levering M.D.   On: 08/11/2017 03:45   Ct Chest Wo Contrast  Result Date: 08/11/2017 CLINICAL DATA:  Metastatic disease. EXAM: CT CHEST WITHOUT CONTRAST TECHNIQUE:  Multidetector CT imaging of the chest was performed following the standard protocol without IV contrast. COMPARISON:  CT scan of Aug 10, 2017. FINDINGS: Cardiovascular: Atherosclerosis of thoracic aorta is noted without aneurysm formation. Extensive coronary artery calcifications are noted. Normal cardiac size. No pericardial effusion. Mediastinum/Nodes: No enlarged mediastinal or axillary lymph nodes. Thyroid gland, trachea, and esophagus demonstrate no significant findings. Lungs/Pleura: No pneumothorax or pleural effusion is noted. Mild bilateral posterior basilar subsegmental atelectasis is noted. Upper Abdomen: Large low density lesions are noted in the visualized liver consistent with metastatic disease. Left adrenal mass is again noted. Musculoskeletal: No chest wall mass or suspicious bone lesions identified. IMPRESSION: Extensive coronary artery calcifications are noted suggesting coronary artery disease. Mild bilateral posterior basilar subsegmental atelectasis is noted. Hepatic lesions are again noted consistent with metastatic disease. Left adrenal mass is noted. Aortic Atherosclerosis (ICD10-I70.0). Electronically Signed   By: Marijo Conception, M.D.   On: 08/11/2017 12:09   Mr Abdomen W Wo Contrast  Result Date: 08/21/2017 CLINICAL DATA:  Multifocal hepatocellular carcinoma EXAM: MRI ABDOMEN WITHOUT AND WITH CONTRAST TECHNIQUE: Multiplanar multisequence MR imaging of the abdomen was performed both before and after the administration of intravenous contrast. CONTRAST:  10mL MULTIHANCE GADOBENATE DIMEGLUMINE 529 MG/ML IV SOLN COMPARISON:  MRI 08/16/2017 and 08/19/2017 FINDINGS: Lower chest: No pulmonary lesions or pleural effusion. Streaky bibasilar atelectasis. No pericardial effusion. Hepatobiliary: Hepatomegaly due to multiple right hepatic lobe masses. There are large lesions in segment 8, segment 7, segment 5 and segment 6. I do not see any left hepatic lobe disease. The segment 8 lesion pushes  and compresses the middle hepatic vein laterally. The segment 8 lesion measures 9.5 x 8.7 cm. Segment 7 lesion measures 12.5 x 12.3 cm. Segment 5 lesion measures 11.5 x 7.0 cm. Segment 6 lesion measures 12.4 x 10.4 cm. The gallbladder is normal.  No common bile duct dilatation. Pancreas:  No mass, inflammation or ductal dilatation. Spleen:  Normal size.  No focal lesions. Adrenals/Urinary Tract: Stable left adrenal gland mass measuring 3.5 cm. This demonstrates loss of signal intensity on the T1 weighted gradient echo sequence consistent with a benign adenoma. Simple appearing right renal cysts. The largest cyst measures 9.6 cm and is projecting off the lower pole region of the right kidney. No hydronephrosis. Stomach/Bowel: Visualized portions within the abdomen are unremarkable. There is a benign-appearing cystic area associated with the gastric cardia. Vascular/Lymphatic: No pathologically enlarged lymph nodes identified. Stable borderline periportal and celiac axis lymph nodes typically seen with cirrhosis. No abdominal aortic aneurysm demonstrated. Other:  No ascites or abdominal wall hernia. Musculoskeletal: No significant bony findings. IMPRESSION: 1. 4 large right hepatic lobe masses, 1 in each segment. No definite left hepatic lobe disease.  2. Stable left adrenal gland adenoma. 3. Small upper abdominal lymph nodes likely related to the patient's cirrhosis. Electronically Signed   By: Marijo Sanes M.D.   On: 08/21/2017 14:00   Ct Abdomen Pelvis W Contrast  Addendum Date: 08/10/2017   ADDENDUM REPORT: 08/10/2017 23:02 ADDENDUM: Additionally, as stated in the findings, there is a left adrenal mass that has decreased in size compared to the prior study. Electronically Signed   By: Ulyses Jarred M.D.   On: 08/10/2017 23:02   Result Date: 08/10/2017 CLINICAL DATA:  Abdominal pain EXAM: CT ABDOMEN AND PELVIS WITH CONTRAST TECHNIQUE: Multidetector CT imaging of the abdomen and pelvis was performed using the  standard protocol following bolus administration of intravenous contrast. CONTRAST:  19mL OMNIPAQUE IOHEXOL 300 MG/ML  SOLN COMPARISON:  CT abdomen pelvis 03/04/2008 FINDINGS: LOWER CHEST: No basilar pulmonary nodules or pleural effusion. No apical pericardial effusion. HEPATOBILIARY: There are multiple (4-5) large hepatic masses, measuring up to approximately 9 x 9 cm. These are concentrated within the right hepatic lobe. The largest lesion is at the inferior margin of the liver. Normal gallbladder. PANCREAS: Normal parenchymal contours without ductal dilatation. No peripancreatic fluid collection. SPLEEN: Normal. ADRENALS/URINARY TRACT: --Adrenal glands: Heterogeneous left adrenal mass measures 3.8 x 3.5 cm, decreased in size from the prior examination. --Right kidney/ureter: Exophytic right renal cyst is increased in size to 6.5 cm. --Left kidney/ureter: No hydronephrosis, nephroureterolithiasis, perinephric stranding or solid renal mass. --Urinary bladder: Normal for degree of distention STOMACH/BOWEL: --Stomach/Duodenum: No hiatal hernia or other gastric abnormality. Normal duodenal course. --Small bowel: No dilatation or inflammation. --Colon: There is a short segment of transverse colon contained within a small periumbilical hernia. There is sigmoid diverticulosis without acute inflammation. --Appendix: Not visualized. No right lower quadrant inflammation or free fluid. VASCULAR/LYMPHATIC: Atherosclerotic calcification is present within the non-aneurysmal abdominal aorta, without hemodynamically significant stenosis. The portal vein, splenic vein, superior mesenteric vein and IVC are patent. No abdominal or pelvic lymphadenopathy. REPRODUCTIVE: No free fluid in the pelvis. MUSCULOSKELETAL. No bony spinal canal stenosis or focal osseous abnormality. OTHER: None. IMPRESSION: 1. Multiple large hepatic masses that are new compared to the prior studies. The appearance is most consistent with hepatic metastatic  disease from an unknown primary. These lesions could be better characterized with abdominal MRI with and without contrast. 2. Short segment transverse colon contained within small periumbilical hernia without evidence of obstruction. 3.  Aortic Atherosclerosis (ICD10-I70.0). Electronically Signed: By: Ulyses Jarred M.D. On: 08/10/2017 22:48   Mr Liver Wo Conrtast  Result Date: 08/19/2017 CLINICAL DATA:  Biopsy proven multicentric hepatocellular carcinoma. Surgical planning. EXAM: MRI ABDOMEN WITHOUT CONTRAST (LIMITED) TECHNIQUE: Multiplanar multisequence MR imaging was attempted without the administration of intravenous contrast. Only coronal and axial HASTE and axial T2 weighted images were obtained due to the patient's condition. The study could not be completed. COMPARISON:  MRI 08/16/2017.  CT 03/04/2008 and 08/10/2017. FINDINGS: Lower chest: As above, the study is incomplete and is motion degraded. There is atelectasis at both lung bases. Hepatobiliary: Multiple large masses are again noted within the right hepatic lobe. Again, no definite involvement of the left hepatic lobe. The middle hepatic vein remains suboptimally visualized, although appears patent. Pancreas:  Appear stable without significant findings. Spleen:  Normal in size without focal abnormality. Adrenals/Urinary Tract: Stable 3.5 x 3.4 cm left adrenal nodule, benign based on previous characterization and stability. The right adrenal gland appears normal. Small renal cysts bilaterally. Stomach/Bowel: Unremarkable. Vascular/Lymphatic: No enlarged abdominal lymph nodes. Aortic atherosclerosis.  Other:  Mild mesenteric and retroperitoneal edema.  No ascites. Musculoskeletal: No acute or significant osseous findings. Mild spondylosis. IMPRESSION: 1. Attempted repeat examination for surgical planning remains limited and is incomplete. Only 3 sequences were obtained. 2. Multifocal hepatocellular carcinoma in the right lobe as on recent prior  studies. No definite involvement of the left lobe. Electronically Signed   By: Richardean Sale M.D.   On: 08/19/2017 16:20   Mr Liver W FB Contrast  Result Date: 08/16/2017 CLINICAL DATA:  Biopsy-proven hepatocellular carcinoma. History of alcohol abuse. Seizure disorder. Evaluate if surgical candidate. EXAM: MRI ABDOMEN WITHOUT AND WITH CONTRAST TECHNIQUE: Multiplanar multisequence MR imaging of the abdomen was performed both before and after the administration of intravenous contrast. CONTRAST:  58mL MULTIHANCE GADOBENATE DIMEGLUMINE 529 MG/ML IV SOLN COMPARISON:  Abdominal CT 08/10/2017. Remote abdominal CT 03/04/2008. FINDINGS: Mild to moderate motion degradation throughout. Lower chest: Mild cardiomegaly, without pericardial or pleural effusion. Hepatobiliary: Marked hepatomegaly at 25.3 cm craniocaudal. Multiple hepatic masses with similar morphology, including T2 hyperintensity, post-contrast hypoenhancement and delayed peripheral enhancement. Dominant lesions including within segment 7 and 8 at 11.5 x 11.7 cm including on image 12/11. Centered in segment 6 at 10.2 by 8.1 cm on image 32/11. Separate adjacent lesion within segments 5 and 6 including at 10.6 x 11.4 cm on image 42/11. A lesion that is favored to be positioned within the anterior right hepatic lobe (with mass-effect impinging upon the medial segment left liver lobe) at 9.1 x 8.1 cm including on image 20/11. Left hepatic lobe cyst. Normal gallbladder, without biliary ductal dilatation. Pancreas:  Grossly normal pancreas. Spleen:  Normal in size, without focal abnormality. Adrenals/Urinary Tract: Normal right adrenal gland. Left adrenal lesion demonstrates signal dropout on out of phase imaging and measures 3.5 x 3.5 cm on image 69/1301. Similar to decreased in size compared to 03/04/2008. Bilateral renal lesions which are likely cysts. A dominant interpolar exophytic 6.2 cm right renal cyst. Stomach/Bowel: Grossly normal stomach and abdominal  bowel loops. Vascular/Lymphatic: Aortic atherosclerosis. Infrarenal abdominal aortic ectasia. 3.2 cm. Bilateral common iliac artery ectasia, including at 1.9 cm on the right and 1.8 cm on the left. No retroperitoneal or retrocrural adenopathy. Other:  No ascites. Musculoskeletal: S-shaped thoracolumbar spine curvature. IMPRESSION: 1. Mild to moderate motion degradation. 2. 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 No convincing evidence of left-sided disease. If the patient is considered a surgical candidate, repeat should be considered when patient is able to follow directions and hold breath, in order to better visualize the course of the middle hepatic vein. 3. Left adrenal adenoma. Electronically Signed   By: Abigail Miyamoto M.D.   On: 08/16/2017 16:57   US Biopsy (liver)  Result Date: 08/12/2017 INDICATION: Liver mass EXAM: ULTRASOUND-GUIDED BIOPSY RIGHT LOBE LIVER MASS.  CORE. MEDICATIONS: None. ANESTHESIA/SEDATION: Fentanyl 25 mcg IV; Versed 1 mg IV Moderate Sedation Time:  10 minutes The patient was continuously monitored during the procedure by the interventional radiology nurse under my direct supervision. FLUOROSCOPY TIME:  Fluoroscopy Time:  minutes  seconds ( mGy). COMPLICATIONS: None immediate. PROCEDURE: Informed written consent was obtained from the patient after a thorough discussion of the procedural risks, benefits and alternatives. All questions were addressed. Maximal Sterile Barrier Technique was utilized including caps, mask, sterile gowns, sterile gloves, sterile drape, hand hygiene and skin antiseptic. A timeout was performed prior to the initiation of the procedure. The right upper quadrant was prepped with ChloraPrep in a  sterile fashion, and a sterile drape was applied covering the operative field. A sterile gown and sterile gloves were used for the procedure. Under sonographic guidance, an 17  gauge guide needle was advanced into the right lobe liver lesion. Subsequently 3 18 gauge core biopsies were obtained. Gel-Foam slurry was injected into the needle tract. The guide needle was removed. Final imaging was performed. Patient tolerated the procedure well without complication. Vital sign monitoring by nursing staff during the procedure will continue as patient is in the special procedures unit for post procedure observation. FINDINGS: The images document guide needle placement within the right lobe liver lesion. Post biopsy images demonstrate no hemorrhage. IMPRESSION: Successful ultrasound-guided core biopsy of a right lobe liver lesion. Electronically Signed   By: Marybelle Killings M.D.   On: 08/12/2017 11:53   Dg Chest Port 1 View  Result Date: 08/15/2017 CLINICAL DATA:  Fever and body aches today EXAM: PORTABLE CHEST 1 VIEW COMPARISON:  08/11/2017 FINDINGS: Normal heart size. Low volumes. Bibasilar atelectasis. No pneumothorax or pleural effusion. IMPRESSION: Bibasilar atelectasis. Electronically Signed   By: Marybelle Killings M.D.   On: 08/15/2017 15:53    Microbiology: Recent Results (from the past 240 hour(s))  Urine Culture     Status: Abnormal   Collection Time: 08/15/17  1:58 PM  Result Value Ref Range Status   Specimen Description URINE, CLEAN CATCH  Final   Special Requests NONE  Final   Culture (A)  Final    <10,000 COLONIES/mL INSIGNIFICANT GROWTH Performed at [facility], [address] [place], [place] [address]    Report Status 08/16/2017 FINAL  Final  Culture, blood (routine x 2)     Status: None   Collection Time: 08/15/17  3:16 PM  Result Value Ref Range Status   Specimen Description BLOOD LEFT HAND  Final   Special Requests   Final    BOTTLES DRAWN AEROBIC AND ANAEROBIC Blood Culture adequate volume   Culture   Final    NO GROWTH 5 DAYS Performed at [facility], [place] [address] [place], [place] [address]    Report Status 08/20/2017 FINAL  Final   Culture, blood (routine x 2)     Status: None   Collection Time: 08/15/17  3:16 PM  Result Value Ref Range Status   Specimen Description BLOOD LEFT HAND  Final   Special Requests   Final    BOTTLES DRAWN AEROBIC AND ANAEROBIC Blood Culture adequate volume   Culture   Final    NO GROWTH 5 DAYS Performed at [facility], [place] [address], [place], [place] [address]    Report Status 08/20/2017 FINAL  Final     Labs: Basic Metabolic Panel: Recent Labs  Lab 08/18/17 0407 08/19/17 0437 08/20/17 0647 08/21/17 0817  NA 137 139 137 140  K 4.2 4.5 4.0 4.0  CL 102 104 104 105  CO2 26 22 24 27   GLUCOSE 105* 82 103* 111*  BUN 22* 20 23* 25*  CREATININE 1.57* 1.43* 1.49* 1.33*  CALCIUM 8.6* 8.7* 8.6* 8.7*   Liver Function Tests: Recent Labs  Lab 08/19/17 0437 08/20/17 0647  AST 82* 80*  ALT 28 28  ALKPHOS 184* 203*  BILITOT 0.9 0.8  PROT 5.9* 6.1*  ALBUMIN 2.3* 2.5*   No results for input(s): LIPASE, AMYLASE in the last 168 hours. No results for input(s): AMMONIA in the last 168 hours. CBC: Recent Labs  Lab 08/18/17 0407 08/19/17 0437 08/20/17 3299 08/21/17  0817  WBC 13.8* 15.0* 16.0* 16.5*  HGB 9.4* 9.6* 9.7* 9.7*  HCT 29.8* 31.1* 30.5* 31.7*  MCV 84.7 85.9 85.0 85.4  PLT 600* 579* 706* 675*   Cardiac Enzymes: No results for input(s): CKTOTAL, CKMB, CKMBINDEX, TROPONINI in the last 168 hours. BNP: BNP (last 3 results) No results for input(s): BNP in the last 8760 hours.  ProBNP (last 3 results) No results for input(s): PROBNP in the last 8760 hours.  CBG: No results for input(s): GLUCAP in the last 168 hours.     Signed:  Domenic Polite MD.  [facility] 08/24/2017, 11:16 AM

## 2017-08-24 NOTE — Progress Notes (Signed)
Patient will DC to: [facility] Anticipated DC date: 08/24/17 Family notified: Daughter, Paramedic by: Domenica Reamer   Per MD patient ready for DC to [facility]. RN, patient, patient's family, and facility notified of DC. Discharge Summary sent to facility. RN given number for report [phone] Room A1-2). DC packet on chart. Ambulance transport requested for patient.   CSW signing off.  Cedric Fishman, LCSW Clinical Social Worker [phone]

## 2017-08-24 NOTE — Progress Notes (Signed)
Pt's daughter called stating that her son (pt's grandson) has been diagnosed with strep throat and he was up here at hospital with pt several days last week. Daughter states pt had c/o to her that his throat was hurting. Daughter wants MD to be aware and wants pt to be treated. She would also like to be called back and informed of the plan that is being done. Daughter aware pt suppose to leave today to SNF. Informed daughter nurse would give the information to the doctor.   Dr Broadus John sent a text message via amion system informing of the above and daughter';s request to be called back with plan.

## 2017-08-24 NOTE — Progress Notes (Signed)
CSW updated patient's daughter that Gabriel Romero has received authorization for patient to transfer today. [facility] will fax daughter admission paperwork. [name] expressed being afraid that Medicare will send patient home quickly with no one to help him. CSW explained that Medicaid is the only payor to assist with long term placement. Daughter reports understanding that the application process takes several months. [facility] will assist daughter with Medicaid application.   Gabriel Locus Kotaro Buer LCSW [phone]

## 2017-08-24 NOTE — Progress Notes (Signed)
  Speech Language Pathology Treatment: Dysphagia  Patient Details Name: Gabriel Romero MRN: [id number] DOB: 12/03/1950 Today's Date: 08/24/2017 Time: 4163-8453 SLP Time Calculation (min) (ACUTE ONLY): 25 min  Assessment / Plan / Recommendation Clinical Impression  Pt seen for follow up to assess tolerance of advanced diet. Pt continues to be confused, but exhibits no overt s/s aspiration. Nursing present to provide meds whole in puree. Pt required cues to swallow pills, not chew them. Safe swallow precautions updated at head of bed. ST to sign off at this time. Please reconsult if needs arise.    HPI HPI: Gabriel Romero is an 67 y.o. 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  CT of the abdomen and pelvis showed multiple large liver masses consistent with metastatic disease from an unknown primary.  Also found to have AKI. Patient underwent liver biopsy 5-23. Pathology consistent with [place]. Pt with encephalopathy and fever; CXR showed bibasilar atelectasis. Evaluated by SLP during admission for CVA 06/19/16 for cognition only; passed stroke swallow screen at that time.       SLP Plan  DC ST - goals met      Recommendations  Diet recommendations: Regular;Thin liquid Liquids provided via: Cup;Straw Medication Administration: Whole meds with puree Supervision: Full supervision/cueing for compensatory strategies Compensations: Slow rate;Small sips/bites;Minimize environmental distractions;Follow solids with liquid Postural Changes and/or Swallow Maneuvers: Seated upright 90 degrees;Upright 30-60 min after meal                Oral Care Recommendations: Oral care BID Follow up Recommendations: None SLP Visit Diagnosis: Dysphagia, unspecified (R13.10) Plan: Continue with current plan of care       GO              Rudi Bunyard  B[facility] Qu[facility], CCC-SLP Speech Language Pathologist [phone]  Shonna Chock 08/24/2017, 10:15 AM

## 2017-08-24 NOTE — Progress Notes (Signed)
Pt discharging to SNF.  Pt d/c'd with belongings, transported by [place].

## 2017-08-25 DIAGNOSIS — I10 Essential (primary) hypertension: Secondary | ICD-10-CM | POA: Diagnosis not present

## 2017-08-25 DIAGNOSIS — E7220 Disorder of urea cycle metabolism, unspecified: Secondary | ICD-10-CM | POA: Diagnosis not present

## 2017-08-25 DIAGNOSIS — C220 Liver cell carcinoma: Secondary | ICD-10-CM | POA: Diagnosis not present

## 2017-08-25 DIAGNOSIS — G40909 Epilepsy, unspecified, not intractable, without status epilepticus: Secondary | ICD-10-CM | POA: Diagnosis not present

## 2017-08-25 MED FILL — Ephedrine Sulf-NaCl Soln Pref Syr 50 MG/10ML-0.9% (5 MG/ML): INTRAVENOUS | Qty: 10 | Status: AC

## 2017-08-25 MED FILL — Sugammadex Sodium IV 500 MG/5ML (Base Equivalent): INTRAVENOUS | Qty: 5 | Status: AC

## 2017-08-25 MED FILL — Propofol IV Emul 200 MG/20ML (10 MG/ML): INTRAVENOUS | Qty: 20 | Status: AC

## 2017-08-25 MED FILL — Lidocaine HCl Local Soln Prefilled Syringe 100 MG/5ML (2%): INTRAMUSCULAR | Qty: 5 | Status: AC

## 2017-08-25 MED FILL — Succinylcholine Chloride Sol Pref Syr 200 MG/10ML (20 MG/ML): INTRAVENOUS | Qty: 10 | Status: AC

## 2017-08-25 MED FILL — Ondansetron HCl Inj 4 MG/2ML (2 MG/ML): INTRAMUSCULAR | Qty: 2 | Status: AC

## 2017-08-25 MED FILL — Midazolam HCl Inj 2 MG/2ML (Base Equivalent): INTRAMUSCULAR | Qty: 2 | Status: AC

## 2017-08-25 MED FILL — Rocuronium Bromide IV Soln Pref Syringe 50 MG/5ML (10 MG/ML): INTRAVENOUS | Qty: 5 | Status: AC

## 2017-08-25 MED FILL — Dexamethasone Sod Phosphate Preservative Free Inj 10 MG/ML: INTRAMUSCULAR | Qty: 1 | Status: AC

## 2017-08-26 DIAGNOSIS — E7220 Disorder of urea cycle metabolism, unspecified: Secondary | ICD-10-CM | POA: Diagnosis not present

## 2017-08-26 DIAGNOSIS — C220 Liver cell carcinoma: Secondary | ICD-10-CM | POA: Diagnosis not present

## 2017-08-26 DIAGNOSIS — R109 Unspecified abdominal pain: Secondary | ICD-10-CM | POA: Diagnosis not present

## 2017-08-27 DIAGNOSIS — E7220 Disorder of urea cycle metabolism, unspecified: Secondary | ICD-10-CM | POA: Diagnosis not present

## 2017-08-27 DIAGNOSIS — R109 Unspecified abdominal pain: Secondary | ICD-10-CM | POA: Diagnosis not present

## 2017-08-27 DIAGNOSIS — C220 Liver cell carcinoma: Secondary | ICD-10-CM | POA: Diagnosis not present

## 2017-08-31 ENCOUNTER — Other Ambulatory Visit: Payer: Self-pay | Admitting: *Deleted

## 2017-08-31 DIAGNOSIS — R410 Disorientation, unspecified: Secondary | ICD-10-CM | POA: Diagnosis not present

## 2017-08-31 DIAGNOSIS — C220 Liver cell carcinoma: Secondary | ICD-10-CM | POA: Diagnosis not present

## 2017-08-31 DIAGNOSIS — C787 Secondary malignant neoplasm of liver and intrahepatic bile duct: Secondary | ICD-10-CM

## 2017-09-01 ENCOUNTER — Inpatient Hospital Stay: Payer: Medicare HMO | Attending: Hematology & Oncology | Admitting: Hematology & Oncology

## 2017-09-01 ENCOUNTER — Telehealth: Payer: Self-pay | Admitting: Pharmacist

## 2017-09-01 ENCOUNTER — Telehealth: Payer: Self-pay | Admitting: Pharmacy Technician

## 2017-09-01 ENCOUNTER — Other Ambulatory Visit: Payer: Self-pay | Admitting: *Deleted

## 2017-09-01 ENCOUNTER — Inpatient Hospital Stay: Payer: Medicare HMO

## 2017-09-01 ENCOUNTER — Encounter: Payer: Self-pay | Admitting: Hematology & Oncology

## 2017-09-01 ENCOUNTER — Other Ambulatory Visit: Payer: Self-pay

## 2017-09-01 VITALS — BP 117/83 | HR 108 | Temp 98.4°F | Wt 148.8 lb

## 2017-09-01 DIAGNOSIS — R772 Abnormality of alphafetoprotein: Secondary | ICD-10-CM

## 2017-09-01 DIAGNOSIS — C787 Secondary malignant neoplasm of liver and intrahepatic bile duct: Secondary | ICD-10-CM

## 2017-09-01 DIAGNOSIS — C220 Liver cell carcinoma: Secondary | ICD-10-CM

## 2017-09-01 DIAGNOSIS — Z8673 Personal history of transient ischemic attack (TIA), and cerebral infarction without residual deficits: Secondary | ICD-10-CM

## 2017-09-01 DIAGNOSIS — Z79899 Other long term (current) drug therapy: Secondary | ICD-10-CM

## 2017-09-01 LAB — COMPREHENSIVE METABOLIC PANEL
ALBUMIN: 3.2 g/dL — AB (ref 3.5–5.0)
ALK PHOS: 512 U/L — AB (ref 40–150)
ALT: 73 U/L — ABNORMAL HIGH (ref 0–55)
AST: 150 U/L — AB (ref 5–34)
Anion gap: 16 — ABNORMAL HIGH (ref 3–11)
BILIRUBIN TOTAL: 0.8 mg/dL (ref 0.2–1.2)
BUN: 53 mg/dL — AB (ref 7–26)
CALCIUM: 10.4 mg/dL (ref 8.4–10.4)
CO2: 22 mmol/L (ref 22–29)
Chloride: 103 mmol/L (ref 98–109)
Creatinine, Ser: 2.1 mg/dL — ABNORMAL HIGH (ref 0.70–1.30)
GFR calc Af Amer: 36 mL/min — ABNORMAL LOW (ref >60)
GFR calc non Af Amer: 31 mL/min — ABNORMAL LOW (ref >60)
GLUCOSE: 117 mg/dL (ref 70–140)
Potassium: 5.5 mmol/L — ABNORMAL HIGH (ref 3.5–5.1)
Sodium: 141 mmol/L (ref 136–145)
TOTAL PROTEIN: 7.9 g/dL (ref 6.4–8.3)

## 2017-09-01 LAB — CBC WITH DIFFERENTIAL (CANCER CENTER ONLY)
BASOS ABS: 0 10*3/uL (ref 0.0–0.1)
BASOS PCT: 0 %
EOS PCT: 0 %
Eosinophils Absolute: 0 10*3/uL (ref 0.0–0.5)
HCT: 34.9 % — ABNORMAL LOW (ref 38.7–49.9)
Hemoglobin: 10.9 g/dL — ABNORMAL LOW (ref 13.0–17.1)
LYMPHS PCT: 8 %
Lymphs Abs: 1.3 10*3/uL (ref 0.9–3.3)
MCH: 27 pg — ABNORMAL LOW (ref 28.0–33.4)
MCHC: 31.2 g/dL — AB (ref 32.0–35.9)
MCV: 86.4 fL (ref 82.0–98.0)
MONO ABS: 0.9 10*3/uL (ref 0.1–0.9)
MONOS PCT: 6 %
Neutro Abs: 14.6 10*3/uL — ABNORMAL HIGH (ref 1.5–6.5)
Neutrophils Relative %: 87 %
PLATELETS: 1199 10*3/uL — AB (ref 145–400)
RBC: 4.04 MIL/uL — ABNORMAL LOW (ref 4.20–5.70)
RDW: 15.8 % — AB (ref 11.1–15.7)
WBC Count: 16.8 10*3/uL — ABNORMAL HIGH (ref 4.0–10.0)

## 2017-09-01 MED ORDER — SORAFENIB TOSYLATE 200 MG PO TABS: 200.0000 mg | ORAL_TABLET | Freq: Two times a day (BID) | ORAL | 3 refills | Status: DC

## 2017-09-01 MED ORDER — DRONABINOL 2.5 MG PO CAPS: 2.5000 mg | ORAL_CAPSULE | Freq: Two times a day (BID) | ORAL | 0 refills | Status: AC

## 2017-09-01 MED ORDER — SORAFENIB TOSYLATE 200 MG PO TABS: 200.0000 mg | ORAL_TABLET | Freq: Two times a day (BID) | ORAL | 3 refills | Status: AC

## 2017-09-01 NOTE — Telephone Encounter (Signed)
Oral Oncology Pharmacist Encounter  Received new prescription for Nexavar (sorafenib) for the treatment of inoperable hepatocellular carcinoma, planned duration until disease progression or unacceptable drug toxicity.  CMP from 09/01/17 and BP from 09/01/17 assessed, no relevant lab abnormalities And BP controlled. Prescription dose and frequency assessed. For better patient tolerance MD has elected to dose decrease starting dose.  Current medication list in Epic reviewed, one relevant DDIs with sorafenib identified: -Mr. Besson's pantoprazole may decrease the efficacy of sorafenib during coadministration. Monitor for decreased effectiveness of sorafenib.  Prescription has been e-scribed to the [facility] for benefits analysis and approval.  Oral Oncology Clinic will continue to follow for insurance authorization, copayment issues, initial counseling and start date.  Darl Pikes, PharmD, BCPS Hematology/Oncology Clinical Pharmacist [facility] [phone]  09/01/2017 11:27 AM

## 2017-09-01 NOTE — Progress Notes (Signed)
Hematology and Oncology Follow Up Visit  Gabriel Romero [id number] [birth year] 67 y.o. 09/01/2017   Principle Diagnosis:   Hepatocellular carcinoma-multiple lesions-inoperable  Current Therapy:    Observation     Interim History:  Gabriel Romero is back for his first office visit.  I saw him at [facility] in consultation.  Gabriel Romero presented with a mass in the right lobe of the liver.  Gabriel Romero had an MRI which showed multiple right liver lobe masses.  Left lobe was not involved.  Gabriel Romero had no disease outside the liver.  His alpha-fetoprotein was 13,760.  Gabriel Romero has some elements of encephalopathy because of a past CVA a year ago.  Gabriel Romero was seen by surgery.  Gabriel Romero was not felt to be operable.  Gabriel Romero is in a skilled nursing facility.  Unfortunately, his status has declined.  Gabriel Romero comes in in a wheelchair.  His daughter comes in with him.  I am not sure if Gabriel Romero is hurting.  Gabriel Romero is on OxyContin and oxycodone.  I do not see where Gabriel Romero had an ammonia level taken while in the hospital.  I have to imagine that Gabriel Romero had one done at some point.  I actually found a level from May 25 and it was minimally elevated at 36.  I talked to his daughter about his situation.  I thought that maybe we could give him a trial of Nexavar.  This is fairly well-tolerated.  I do not think Gabriel Romero would be a candidate for full dose therapy.  I would consider 200 mg p.o. twice daily therapy.  May be, if Gabriel Romero had a good response, and his overall mental status improved, there may be Gabriel Romero could be considered an operative candidate.  It is hard to tell how well Gabriel Romero is eating.  Gabriel Romero has some ecchymoses on his left forearm.  According to his daughter, Gabriel Romero fell out of his bed yesterday.  I am not sure if Gabriel Romero really is able to walk on his own.  Gabriel Romero has had no bowel or bladder incontinence.  Gabriel Romero has had no cough.  Overall, his performance status is ECOG 3.  Medications:  Current Outpatient Medications:  .  acetaminophen (TYLENOL) 325 MG tablet, Take 2  tablets (650 mg total) by mouth every 6 (six) hours as needed for mild pain or headache (or Fever >/= 101)., Disp: , Rfl:  .  amLODipine (NORVASC) 5 MG tablet, Take 1 tablet (5 mg total) by mouth daily., Disp: , Rfl:  .  aspirin EC 81 MG tablet, Take 1 tablet (81 mg total) by mouth daily., Disp: 1 tablet, Rfl: 1 .  atorvastatin (LIPITOR) 20 MG tablet, Take 1 tablet (20 mg total) by mouth daily at 6 PM., Disp: 30 tablet, Rfl: 1 .  lactulose (CHRONULAC) 10 GM/15ML solution, Take 45 mLs (30 g total) by mouth 2 (two) times daily., Disp: , Rfl: 0 .  levETIRAcetam (KEPPRA) 500 MG tablet, Take 1 tablet (500 mg total) by mouth 2 (two) times daily., Disp: 60 tablet, Rfl: 11 .  oxyCODONE (OXY IR/ROXICODONE) 5 MG immediate release tablet, Take 1 tablet (5 mg total) by mouth every 6 (six) hours as needed for severe pain., Disp: 10 tablet, Rfl: 0 .  oxyCODONE (OXYCONTIN) 20 mg 12 hr tablet, Take 1 tablet (20 mg total) by mouth every 12 (twelve) hours., Disp: 10 tablet, Rfl: 0 .  pantoprazole (PROTONIX) 40 MG tablet, Take 1 tablet (40 mg total) by mouth daily., Disp: 30 tablet, Rfl: 1 .  senna-docusate (  SENOKOT-S) 8.6-50 MG tablet, Take 1 tablet by mouth at bedtime as needed for mild constipation., Disp: , Rfl:   Allergies:  Allergies  Allergen Reactions  . Sulfa Antibiotics Rash    Past Medical History, Surgical history, Social history, and Family History were reviewed and updated.  Review of Systems: Review of Systems  Constitutional: Positive for fatigue.  HENT:  Negative.   Eyes: Negative.   Respiratory: Negative.   Cardiovascular: Negative.   Gastrointestinal: Positive for constipation and nausea.  Endocrine: Negative.   Genitourinary: Negative.    Musculoskeletal: Positive for gait problem.  Skin: Negative.   Neurological: Positive for gait problem.  Hematological: Negative.   Psychiatric/Behavioral: Positive for confusion.    Physical Exam:  weight is 148 lb 12.8 oz (67.5 kg). His oral  temperature is 98.4 F (36.9 C). His blood pressure is 117/83 and his pulse is 108 (abnormal). His oxygen saturation is 100%.   Wt Readings from Last 3 Encounters:  09/01/17 148 lb 12.8 oz (67.5 kg)  08/20/17 171 lb (77.6 kg)  04/28/17 186 lb (84.4 kg)    Physical Exam  Constitutional: Gabriel Romero is oriented to person, place, and time.  Ill-appearing white male.  Gabriel Romero is somewhat confused.  Gabriel Romero is in a wheelchair.  Gabriel Romero has some muscle atrophy in upper and lower extremities.  HENT:  Head: Normocephalic and atraumatic.  Mouth/Throat: Oropharynx is clear and moist.  Eyes: Pupils are equal, round, and reactive to light. EOM are normal.  Neck: Normal range of motion.  Cardiovascular: Normal rate, regular rhythm and normal heart sounds.  Pulmonary/Chest: Effort normal and breath sounds normal.  Abdominal: Soft. Bowel sounds are normal.  Musculoskeletal: Normal range of motion. Gabriel Romero exhibits no edema, tenderness or deformity.  Lymphadenopathy:    Gabriel Romero has no cervical adenopathy.  Neurological: Gabriel Romero is alert and oriented to person, place, and time.  Skin: Skin is warm and dry. No rash noted. No erythema.  Psychiatric: Gabriel Romero has a normal mood and affect. His behavior is normal. Judgment and thought content normal.  Vitals reviewed.    Lab Results  Component Value Date   WBC 16.8 (H) 09/01/2017   HGB 10.9 (L) 09/01/2017   HCT 34.9 (L) 09/01/2017   MCV 86.4 09/01/2017   PLT 1,199 (H) 09/01/2017     Chemistry      Component Value Date/Time   NA 140 08/21/2017 0817   K 4.0 08/21/2017 0817   CL 105 08/21/2017 0817   CO2 27 08/21/2017 0817   BUN 25 (H) 08/21/2017 0817   CREATININE 1.33 (H) 08/21/2017 0817      Component Value Date/Time   CALCIUM 8.7 (L) 08/21/2017 0817   ALKPHOS 203 (H) 08/20/2017 0647   AST 80 (H) 08/20/2017 0647   ALT 28 08/20/2017 0647   BILITOT 0.8 08/20/2017 0647      Impression and Plan: Gabriel Romero is a 67 year old white male.  Gabriel Romero has hepatocellular carcinoma.  Gabriel Romero has  disease confined to his liver although Gabriel Romero has fairly extensive tumor burden in the right lobe of the liver.  In addition, Gabriel Romero has a markedly elevated alpha-fetoprotein.  Again, I spoke with his daughter.  I told her that if Gabriel Romero did not take treatment or did not respond to treatment, that I would be surprised if Gabriel Romero made it to September.  His decline has been significant over the past 3 weeks.  For right now, we will try him on Nexavar.  I think this is reasonable.  We will  give him reduced dose.  Hopefully, we can get him started this week.  Gabriel Romero lives up in [place].  It might be a lot easier if we could just have him see our clinic up at [facility].  This might be a lot more feasible logistically.   I would think that follow-up in 3 weeks would be a good interval for follow-up.  We spent about 40 minutes with Gabriel Romero and his daughter.  All the time was spent face-to-face and we counseled Gabriel Romero and his daughter regarding his situation.  We answered her questions.   Volanda Napoleon, MD 6/11/201910:08 AM

## 2017-09-01 NOTE — Telephone Encounter (Signed)
Oral Oncology Patient Advocate Encounter  Received notification from [facility] that prior authorization for Nexavar is required.  PA submitted on CoverMyMeds Key [id number] Status is pending  Oral Oncology Clinic will continue to follow.  [place] Patient [place] Phone [phone] Fax [phone] 09/01/2017 12:07 PM

## 2017-09-01 NOTE — Telephone Encounter (Signed)
Oral Oncology Patient Advocate Encounter  Called and spoke to patients daughter, Gabriel Romero, to complete application for [employer]ACH in an effort to reduce patient's out of pocket expense for Nexavar to $0.    Will fax application when complete with supporting documents to RE[employer] at [phone].  This encounter will be updated until final determination.   [place] Patient [place] Phone [phone] Fax [phone] 09/01/2017 2:28 PM

## 2017-09-01 NOTE — Telephone Encounter (Signed)
Oral Oncology Patient Advocate Encounter  Prior Authorization for Nexavar has been approved.    Effective dates: 03/22/17 through 03/23/18  Oral Oncology Clinic will continue to follow.   [place] Patient [place] Phone [phone] Fax [phone] 09/01/2017 12:44 PM

## 2017-09-01 NOTE — Addendum Note (Signed)
Addended by: Burney Gauze R on: 09/01/2017 10:19 AM   Modules accepted: Orders

## 2017-09-02 DIAGNOSIS — E7220 Disorder of urea cycle metabolism, unspecified: Secondary | ICD-10-CM | POA: Diagnosis not present

## 2017-09-02 DIAGNOSIS — G40909 Epilepsy, unspecified, not intractable, without status epilepticus: Secondary | ICD-10-CM | POA: Diagnosis not present

## 2017-09-02 DIAGNOSIS — I10 Essential (primary) hypertension: Secondary | ICD-10-CM | POA: Diagnosis not present

## 2017-09-14 NOTE — Telephone Encounter (Signed)
Oral Oncology Patient Advocate Encounter  Called and spoke with patient's daughter, Gabriel Romero, to follow up on patient assistance application.  She informed me that [name] passed away on Father's Day under Hospice care.  [place] Patient [name] Phone [phone] Fax [phone] 09/14/2017 11:20 AM

## 2017-09-21 DEATH — deceased

## 2017-09-22 ENCOUNTER — Inpatient Hospital Stay: Payer: Medicare HMO | Attending: Family | Admitting: Family

## 2017-09-22 ENCOUNTER — Inpatient Hospital Stay: Payer: Medicare HMO

## 2017-12-24 NOTE — Progress Notes (Deleted)
[facility]  PATIENT: Gabriel Romero DOB: 09/14/1950   REASON FOR VISIT: Follow-up for cerebral hemorrhage and seizure April 2018 HISTORY FROM:patietn and daughter [name]    HISTORY OF PRESENT ILLNESS:UPDATE 2/5/2019CM Gabriel Romero, 67 year old male returns for follow-up with a history of cerebral hemorrhage and seizure April 2018.  He is supposed to be on aspirin 0.81 daily but he ran out of the medication and has not been to the drugstore.  He has not had further stroke symptoms.  He remains on atorvastatin without myalgias.  He remains on Keppra 500 twice daily without further seizure activity.  He continues to smoke a pack and a half a day and was encouraged to quit.  He exercises by walking every day.  He is currently living with his son.  Appetite is good and he is sleeping well.  He returns for reevaluation  UPDATE 08/03/2018CM Gabriel Romero, 68 year old male returns for follow-up with his daughter. He has a history of cerebral hemorrhage and seizure which occurred in April 2018. Repeat MRI 08/16/16 This MRI of the brain with and without contrast shows the following: 1.    Many chronic microhemorrhages in the hemispheres, deep gray matter, brainstem and cerebellum consistent with cerebral amyloid angiopathy. The sequela of the right temporal lobe hemorrhage that occurred earlier this year is noted with expected evolutionary changes. 2.    Four small acute infarctions noted in the mesial left frontal lobe, posterior right frontal lobe, left thalamus and right anterior pons.  3.    Severe chronic microvascular ischemic change, possibly with a superimposed right parietal chronic stroke. 4.    Cortical atrophy most pronounced in the mesial temporal lobes.  Corpus callosal atrophy is also noted. 5.    Minimal chronic ethmoid sinusitis. Aspirin was restarted and he is tolerating without bruising or bleeding. Blood pressure in the office today 140/80. Memory is much better  according to the daughter. Patient is no longer drinking alcohol. He has cut down from 2 packs a day smoking to half a pack. He remains on Keppra 500 mg twice daily for seizure. He has not had further seizure activity. He knows he cannot drive for 6 months. He is currently living with his son. Appetite is good and he is sleeping well at night. He returns for reevaluation  HISTORY 07/31/16 Gabriel Romero is a 32 year pleasant male who is seen today for the first office follow-up visit following hospital admission for an to cerebral hemorrhage and seizure in April 2018.Gabriel L Dupreeis a 67 y.o.malewith a history of severe hypertension who presents with new onset seizures in the setting of a typical hematoma. He did fall earlier in the day, but does not clear if he hit his head, there is no report of loss of consciousness. He then had sudden onset seizure activity was brought in via EMS. Head CT reveals a temporal lobe hemorrhage. LKW: Earlier today, I'm not certain what time.tpa given?: no, ICH. ICH Score: 1. He was admitted to the intensive care unit where blood pressure was tightly controlled. CT angiogram of the brain was negative for large vessel stenosis or aneurysms. There was Noted siphon atheromatous plaque but 50-75% left greater than right narrowing. The right temporal intracerebral hemorrhage measure 2.6 x 1.7 x 1.8 cm. It remained stable and follow-up imaging studies. CT scan of cervical spine showed no significant compression on degenerative changes. Patient was started on Keppra for seizures and did well and had no further recurrent seizures. EEG was  done which showed mild generalized slowing and no ongoing seizure activity. Patient became agitated and required Haldol and Precedex drip for alcohol withdrawal. He was seen by critical care team in medical hospitalist team to help with alcohol withdrawal management. He was advised not to drive. He returns today for follow-up in a complaint by his  daughter. Patient has quit drinking completely and has not had any alcohol since discharge. Is also trying to quit smoking and has not quit completely yet. Is currently living at home with his son. His blood pressure is doing better and he is being followed by his primary care physician who is adjusting his medications. He has not been driving.   REVIEW OF SYSTEMS: Full 14 system review of systems performed and notable only for those listed, all others are neg:  Constitutional: neg  Cardiovascular: neg Ear/Nose/Throat: Ringing in ears Skin: neg Eyes: neg Respiratory: neg Gastroitestinal: neg  Hematology/Lymphatic: neg  Endocrine: neg Musculoskeletal:neg Allergy/Immunology: neg Neurological: History of intra-cerebral hemorrhage, seizure disorder Psychiatric: neg Sleep : neg   ALLERGIES: Allergies  Allergen Reactions  . Sulfa Antibiotics Rash    HOME MEDICATIONS: Outpatient Medications Prior to Visit  Medication Sig Dispense Refill  . acetaminophen (TYLENOL) 325 MG tablet Take 2 tablets (650 mg total) by mouth every 6 (six) hours as needed for mild pain or headache (or Fever >/= 101).    [name] amLODipine (NORVASC) 5 MG tablet Take 1 tablet (5 mg total) by mouth daily.    [name] aspirin EC 81 MG tablet Take 1 tablet (81 mg total) by mouth daily. 1 tablet 1  . atorvastatin (LIPITOR) 20 MG tablet Take 1 tablet (20 mg total) by mouth daily at 6 PM. 30 tablet 1  . dronabinol (MARINOL) 2.5 MG capsule Take 1 capsule (2.5 mg total) by mouth 2 (two) times daily before a meal. 60 capsule 0  . lactulose (CHRONULAC) 10 GM/15ML solution Take 45 mLs (30 g total) by mouth 2 (two) times daily.  0  . levETIRAcetam (KEPPRA) 500 MG tablet Take 1 tablet (500 mg total) by mouth 2 (two) times daily. 60 tablet 11  . oxyCODONE (OXY IR/ROXICODONE) 5 MG immediate release tablet Take 1 tablet (5 mg total) by mouth every 6 (six) hours as needed for severe pain. 10 tablet 0  . oxyCODONE (OXYCONTIN) 20 mg 12 hr tablet  Take 1 tablet (20 mg total) by mouth every 12 (twelve) hours. 10 tablet 0  . pantoprazole (PROTONIX) 40 MG tablet Take 1 tablet (40 mg total) by mouth daily. 30 tablet 1  . senna-docusate (SENOKOT-S) 8.6-50 MG tablet Take 1 tablet by mouth at bedtime as needed for mild constipation.    [name] SORAfenib (NEXAVAR) 200 MG tablet Take 1 tablet (200 mg total) by mouth 2 (two) times daily. Give on an empty stomach 1 hour before or 2 hours after meals. 60 tablet 3   No facility-administered medications prior to visit.     PAST MEDICAL HISTORY: Past Medical History:  Diagnosis Date  . CKD (chronic kidney disease), stage II   . ETOH abuse   . Hypertension   . Liver metastasis ([place]) 07/2017  . Seizures ([place])   . Stroke [facility])     PAST SURGICAL HISTORY: Past Surgical History:  Procedure Laterality Date  . CERVICAL DISC SURGERY      FAMILY HISTORY: Family History  Problem Relation Age of Onset  . Dementia Mother   . Hypertension Father   . Colon cancer Neg Hx  SOCIAL HISTORY: Social History   Socioeconomic History  . Marital status: Single    Spouse name: Not on file  . Number of children: 2  . Years of education: Not on file  . Highest education level: Not on file  Occupational History  . Occupation: Bread delivery    Comment: [employer]  Social Needs  . Financial resource strain: Not on file  . Food insecurity:    Worry: Not on file    Inability: Not on file  . Transportation needs:    Medical: Not on file    Non-medical: Not on file  Tobacco Use  . Smoking status: Current Some Day Smoker    Packs/day: 1.50    Types: Cigarettes  . Smokeless tobacco: Never Used  Substance and Sexual Activity  . Alcohol use: Not Currently    Alcohol/week: 12.0 standard drinks    Types: 12 Cans of beer per week    Comment: last quit march 2018  . Drug use: Yes    Frequency: 7.0 times per week    Types: Marijuana    Comment: every 3  months  . Sexual activity: Not on file    Lifestyle  . Physical activity:    Days per week: Not on file    Minutes per session: Not on file  . Stress: Not on file  Relationships  . Social connections:    Talks on phone: Not on file    Gets together: Not on file    Attends religious service: Not on file    Active member of club or organization: Not on file    Attends meetings of clubs or organizations: Not on file    Relationship status: Not on file  . Intimate partner violence:    Fear of current or ex partner: Not on file    Emotionally abused: Not on file    Physically abused: Not on file    Forced sexual activity: Not on file  Other Topics Concern  . Not on file  Social History Narrative   Lives with son     PHYSICAL EXAM  Vitals:   04/28/17  BP: (!) 144/79   Pulse: 66  Weight: 177 lb 3.2 oz (80.4 kg)   There is no height or weight on file to calculate BMI.  Generalized: Well developed, in no acute distress  Head: normocephalic and atraumatic,. Oropharynx benign  Neck: Supple, no carotid bruits  Cardiac: Regular rate rhythm, no murmur  Musculoskeletal: No deformity   Neurological examination   Mentation: Alert oriented to time, place, history taking. Attention span and concentration appropriate. Recent and remote memory intact.  Follows all commands speech and language fluent.   Cranial nerve II-XII: Pupils were equal round reactive to light extraocular movements were full, visual field were full on confrontational test. Facial sensation and strength were normal. hearing was intact to finger rubbing bilaterally. Uvula tongue midline. head turning and shoulder shrug were normal and symmetric.Tongue protrusion into cheek strength was normal. Motor: normal bulk and tone, full strength in the BUE, BLE, fine finger movements normal, no pronator drift.  Sensory: normal and symmetric to light touch, pinprick, and  Vibration, in the  upper and lower extremities Coordination: finger-nose-finger, heel-to-shin  bilaterally, no dysmetria Reflexes: 1+ upper lower and symmetric plantar responses were flexor bilaterally. Gait and Station: Rising up from seated position without assistance, normal stance,  moderate stride, good arm swing, smooth turning, able to perform tiptoe, and heel walking without difficulty. Tandem gait  is mildly unsteady.  No assistive device DIAGNOSTIC DATA (LABS, IMAGING, TESTING) - I reviewed patient records, labs, notes, testing and imaging myself where available.  Lab Results  Component Value Date   WBC 16.8 (H) 09/01/2017   HGB 10.9 (L) 09/01/2017   HCT 34.9 (L) 09/01/2017   MCV 86.4 09/01/2017   PLT 1,199 (H) 09/01/2017      Component Value Date/Time   NA 141 09/01/2017 0817   K 5.5 (H) 09/01/2017 0817   CL 103 09/01/2017 0817   CO2 22 09/01/2017 0817   GLUCOSE 117 09/01/2017 0817   BUN 53 (H) 09/01/2017 0817   CREATININE 2.10 (H) 09/01/2017 0817   CALCIUM 10.4 09/01/2017 0817   PROT 7.9 09/01/2017 0817   ALBUMIN 3.2 (L) 09/01/2017 0817   AST 150 (H) 09/01/2017 0817   ALT 73 (H) 09/01/2017 0817   ALKPHOS 512 (H) 09/01/2017 0817   BILITOT 0.8 09/01/2017 0817   GFRNONAA 31 (L) 09/01/2017 0817   GFRAA 36 (L) 09/01/2017 0817   Lab Results  Component Value Date   CHOL 205 (H) 06/19/2016   HDL 57 06/19/2016   LDLCALC 123 (H) 06/19/2016   TRIG 123 06/19/2016   CHOLHDL 3.6 06/19/2016   Lab Results  Component Value Date   HGBA1C 5.6 06/19/2016   Lab Results  Component Value Date   VITAMINB12 213 06/19/2016   Lab Results  Component Value Date   TSH 3.656 06/19/2016      ASSESSMENT AND PLAN 66year With right temporal lobar hematoma in March 2018 of indeterminate etiology. Hypertensive versus alcohol related. 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. The patient is a current patient of Dr. Leonie Man  who is out of the office today . This note is sent to the work in doctor.     PLAN: Stressed the importance of management of risk factors to prevent  further stroke Restart aspirin for secondary stroke prevention pt was advised to get from drug store it is OTC. Maintain strict control of hypertension with blood pressure goal below 130/90, today's reading 144/79 continue antihypertensive medications Cholesterol with LDL cholesterol less than 70, followed by primary care, continue Lipitor Continue Keppra 500 mg twice daily for seizure will refill Call for any seizure activity Try to stop smoking altogether Continue to exercise by walking, at least 30 min daily eat healthy diet with whole grains,  fresh fruits and vegetables Follow up in 8 months I spent 25 minutes in total face to face time with the patient more than 50% of which was spent counseling and coordination of care, reviewing test results reviewing medications and discussing and reviewing the diagnosis of stroke and management of risk factors. Also discussed seizure triggers.  Patient instructed to get back on his aspirin which is for secondary stroke prevention  Dennie Bible, [facility], [facility], [facility] [address], [place], [place] [address] [phone]

## 2017-12-28 ENCOUNTER — Telehealth: Payer: Self-pay | Admitting: *Deleted

## 2017-12-28 ENCOUNTER — Ambulatory Visit: Payer: Medicare HMO | Admitting: Nurse Practitioner

## 2017-12-28 NOTE — Telephone Encounter (Signed)
Patient was no show for follow up with NP today.  

## 2017-12-29 ENCOUNTER — Encounter: Payer: Self-pay | Admitting: Nurse Practitioner

## 2018-08-20 IMAGING — MR MR ABDOMEN WO/W CM
12 of 21 series · 22 of 48 positions shown · IV contrast (20 mh)
Comparison: MRI 08/16/2017 and 08/19/2017

CLINICAL DATA: Multifocal hepatocellular carcinoma

EXAM:
MRI ABDOMEN WITHOUT AND WITH CONTRAST
TECHNIQUE: Multiplanar multisequence MR imaging of the abdomen was performed
both before and after the administration of intravenous contrast.
CONTRAST:  18mL MULTIHANCE GADOBENATE DIMEGLUMINE 529 MG/ML IV SOLN

[Series 4: cor ssfse nav · coronal · 6.0mm · 0.82mm/px · 1 of 43 slices shown]
[im 1/43]
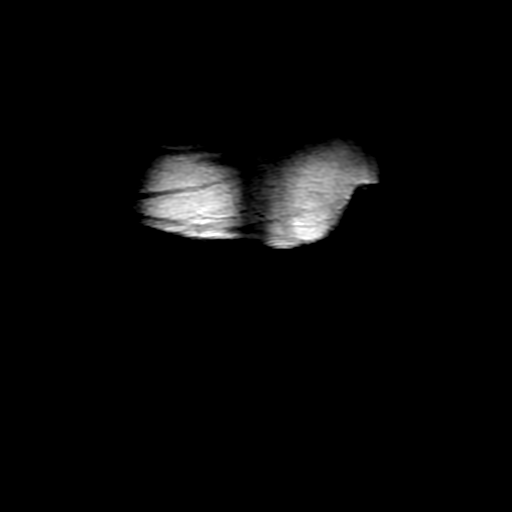

[Series 5: ax ssfse nav · axial · 6.0mm · 0.78mm/px · 1 of 59 slices shown]
[im 1/59]
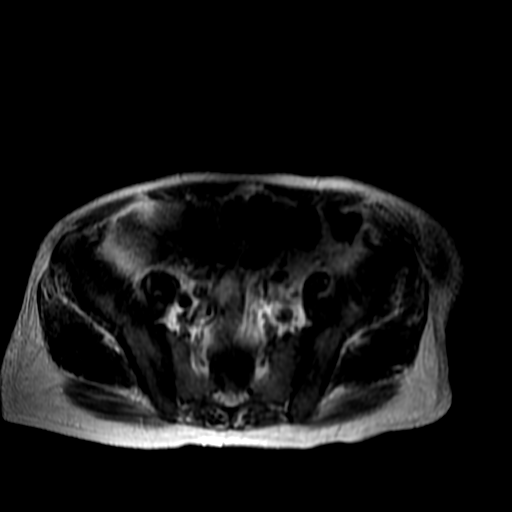

[Series 6: T2 fat-sat · axial · 6.0mm · 0.78mm/px · 1 of 59 slices shown]
[im 1/59]
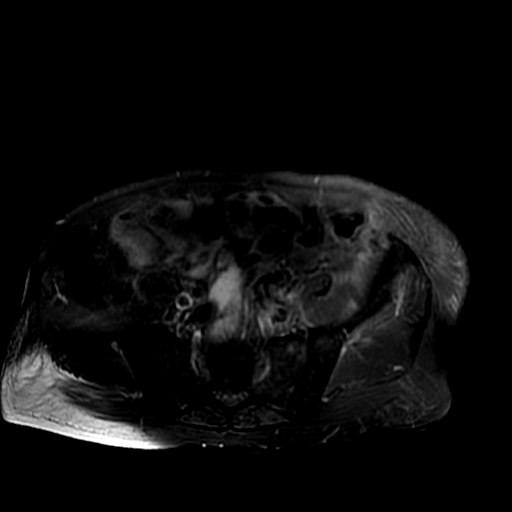

[Series 7: DWI b500 · axial · 8.0mm · 1.76mm/px · 1 of 80 slices shown]
[im 1/80]
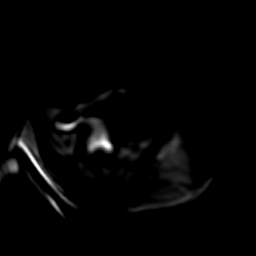

[Series 11: T1 dynamic · coronal · 3.4mm · 1.56mm/px · 2 of 144 slices shown (1 of 2)]
[im 1/144]
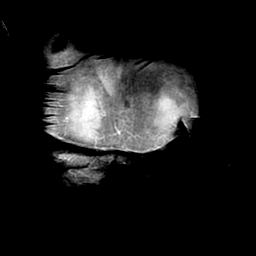
[im 144/144]
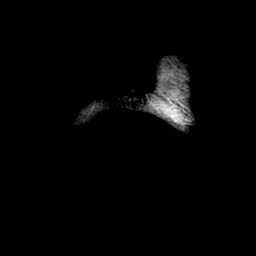

[Series 12: T1 dynamic · coronal · 3.4mm · 1.56mm/px · 2 of 144 slices shown (2 of 2)]
[im 1/144]
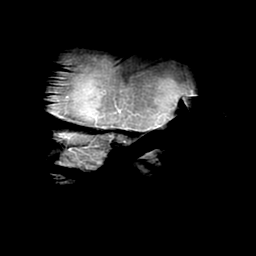
[im 144/144]
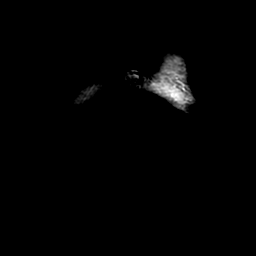

[Series 750: ADC · axial · 8.0mm · 1.76mm/px · 1 of 40 slices shown]
[im 1/40]
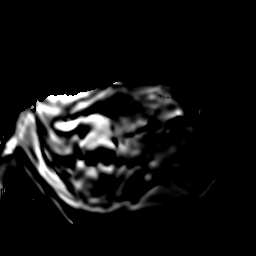

[Series 900: T1 dynamic post-contrast · axial · non-contrast · 5.0mm · 0.82mm/px · z∈[-210,+127]mm · 3 of 136 slices shown (1 of 5)]
[im 1/136]
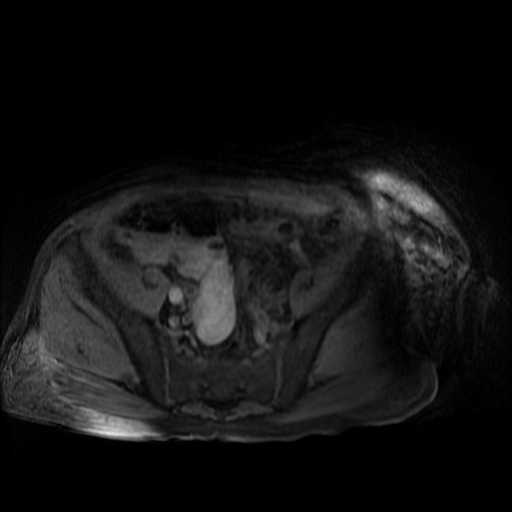
[im 68/136]
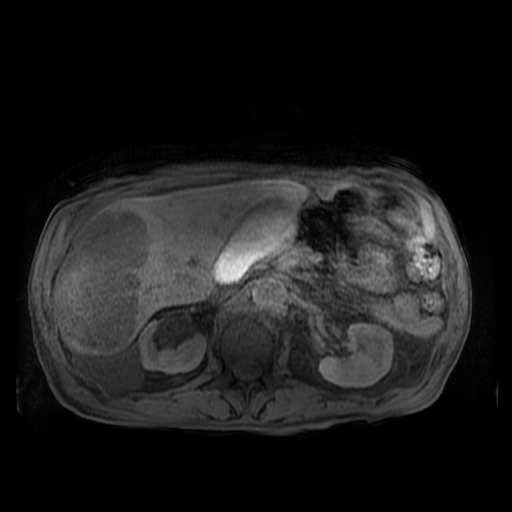
[im 136/136]
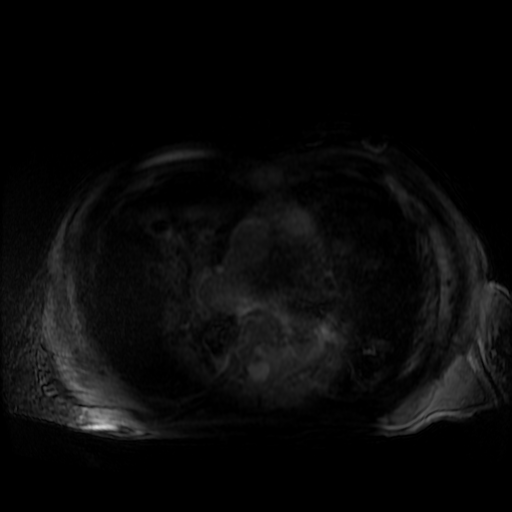

[Series 901: T1 dynamic post-contrast · axial · non-contrast · 5.0mm · 0.82mm/px · z∈[-210,+127]mm · 3 of 136 slices shown (2 of 5)]
[im 1/136]
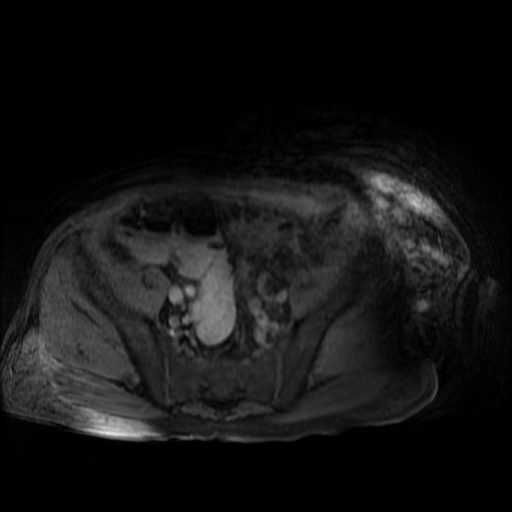
[im 68/136]
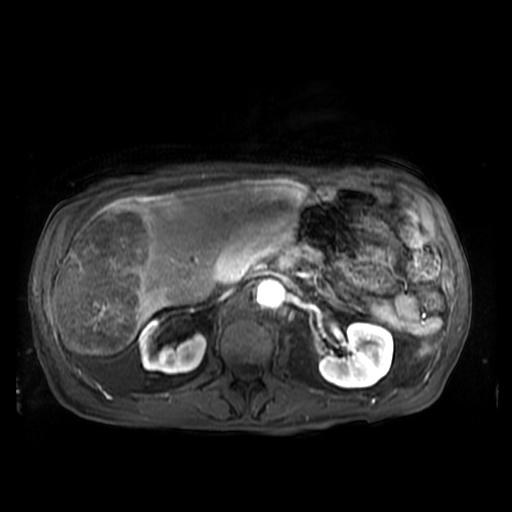
[im 136/136]
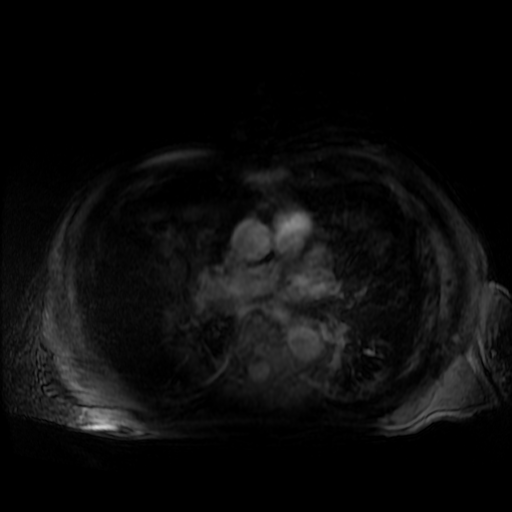

[Series 902: T1 dynamic post-contrast · axial · non-contrast · 5.0mm · 0.82mm/px · z∈[-210,+127]mm · 3 of 136 slices shown (3 of 5)]
[im 1/136]
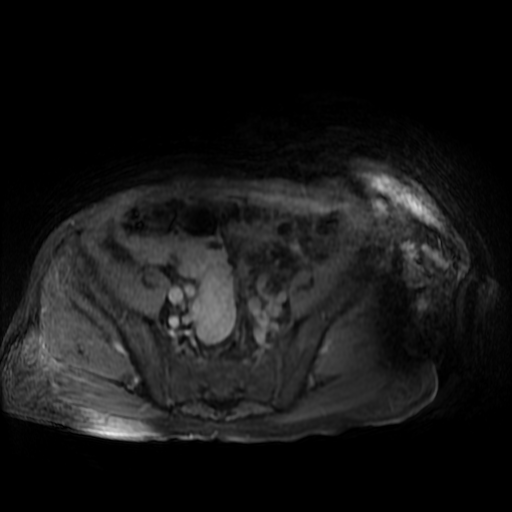
[im 68/136]
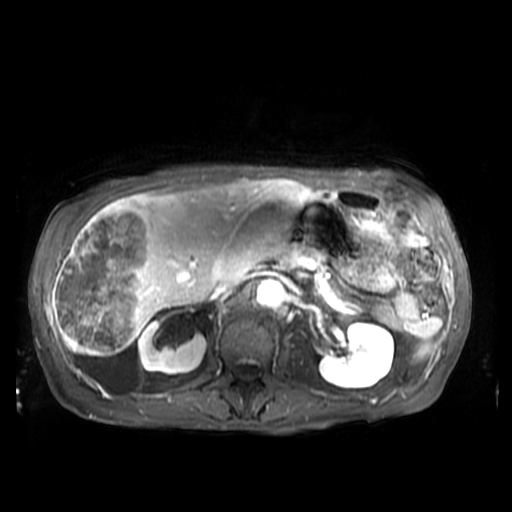
[im 136/136]
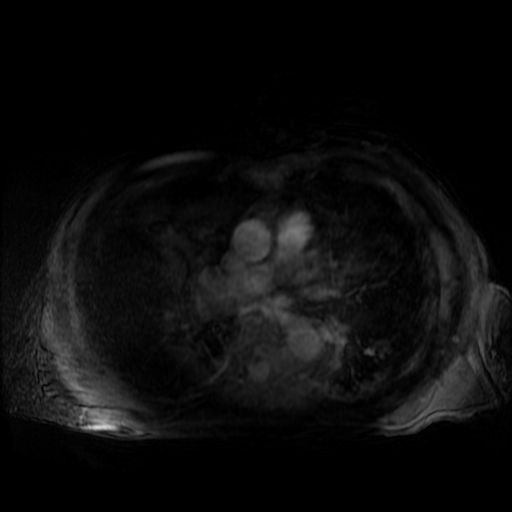

[Series 903: T1 dynamic post-contrast · axial · non-contrast · 5.0mm · 0.82mm/px · z∈[-210,+127]mm · 3 of 136 slices shown (4 of 5)]
[im 1/136]
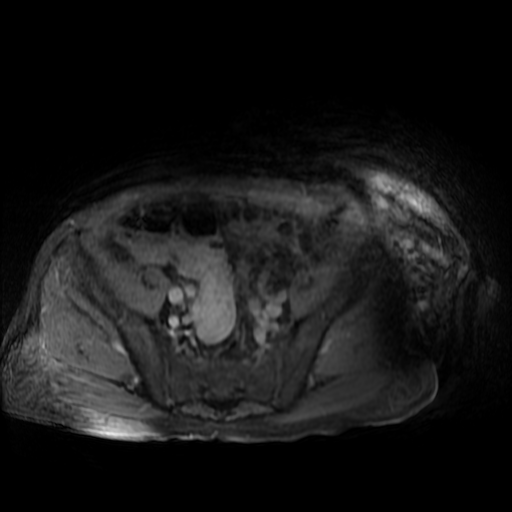
[im 68/136]
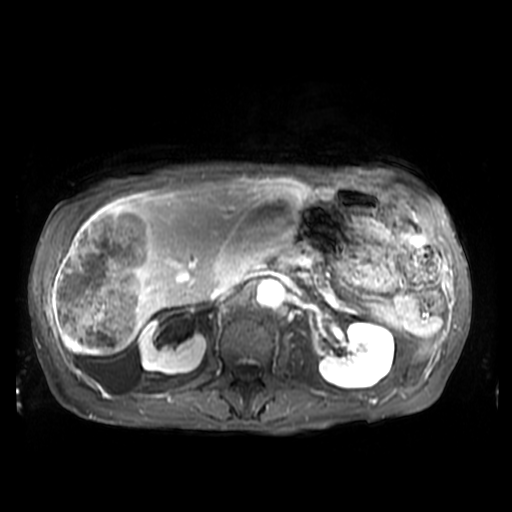
[im 136/136]
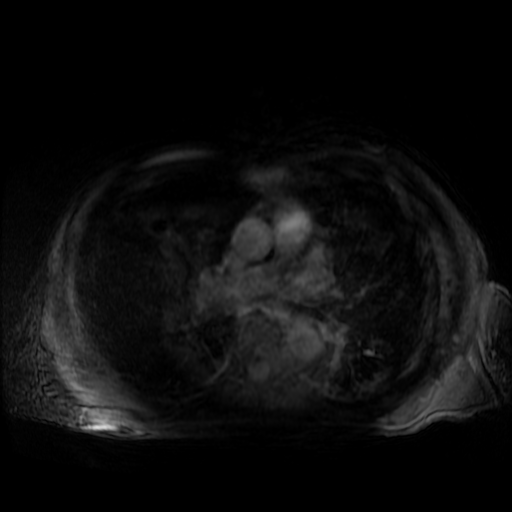

[Series 904: T1 dynamic post-contrast · axial · non-contrast · 5.0mm · 0.82mm/px · 1 of 136 slices shown (5 of 5)]
[im 1/136]
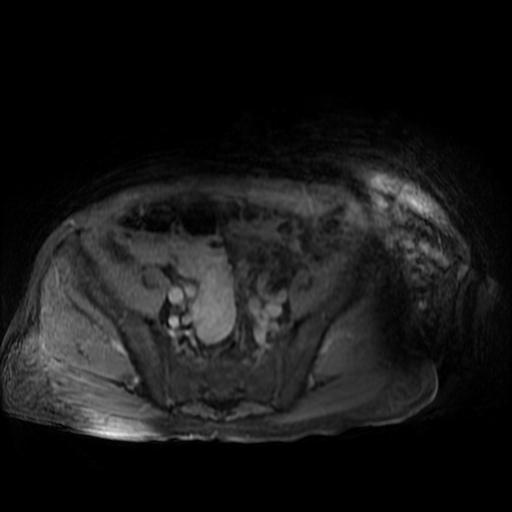

[22 of 48 positions shown; findings below may reference images not displayed]

FINDINGS: Lower chest: No pulmonary lesions or pleural effusion. Streaky
bibasilar atelectasis. No pericardial effusion.

Hepatobiliary: Hepatomegaly due to multiple right hepatic lobe
masses. There are large lesions in segment 8, segment 7, segment 5
and segment 6. I do not see any left hepatic lobe disease. The
segment 8 lesion pushes and compresses the middle hepatic vein
laterally.

The segment 8 lesion measures 9.5 x 8.7 cm. Segment 7 lesion
measures 12.5 x 12.3 cm. Segment 5 lesion measures 11.5 x 7.0 cm.
Segment 6 lesion measures 12.4 x 10.4 cm.

The gallbladder is normal.  No common bile duct dilatation.

Pancreas:  No mass, inflammation or ductal dilatation.

Spleen:  Normal size.  No focal lesions.

Adrenals/Urinary Tract: Stable left adrenal gland mass measuring
cm. This demonstrates loss of signal intensity on the T1 weighted
gradient echo sequence consistent with a benign adenoma.

Simple appearing right renal cysts. The largest cyst measures 9.6 cm
and is projecting off the lower pole region of the right kidney. No
hydronephrosis.

Stomach/Bowel: Visualized portions within the abdomen are
unremarkable. There is a benign-appearing cystic area associated
with the gastric cardia.

Vascular/Lymphatic: No pathologically enlarged lymph nodes
identified. Stable borderline periportal and celiac axis lymph nodes
typically seen with cirrhosis. No abdominal aortic aneurysm
demonstrated.

Other:  No ascites or abdominal wall hernia.

Musculoskeletal: No significant bony findings.
IMPRESSION: 1. 4 large right hepatic lobe masses, 1 in each segment. No definite
left hepatic lobe disease.
2. Stable left adrenal gland adenoma.
3. Small upper abdominal lymph nodes likely related to the patient's
cirrhosis.
